# Patient Record
Sex: Male | Born: 1945 | Race: White | Hispanic: No | Smoking: Former smoker
Health system: Southern US, Community
[De-identification: ages and names within clinical notes are randomized; demographics above are authoritative.]

## PROBLEM LIST (undated history)

## (undated) DIAGNOSIS — I10 Essential (primary) hypertension: Secondary | ICD-10-CM

## (undated) DIAGNOSIS — R519 Headache, unspecified: Secondary | ICD-10-CM

## (undated) DIAGNOSIS — T8859XA Other complications of anesthesia, initial encounter: Secondary | ICD-10-CM

## (undated) DIAGNOSIS — D126 Benign neoplasm of colon, unspecified: Secondary | ICD-10-CM

## (undated) DIAGNOSIS — R51 Headache: Secondary | ICD-10-CM

## (undated) DIAGNOSIS — I739 Peripheral vascular disease, unspecified: Secondary | ICD-10-CM

## (undated) DIAGNOSIS — N400 Enlarged prostate without lower urinary tract symptoms: Secondary | ICD-10-CM

## (undated) DIAGNOSIS — IMO0001 Reserved for inherently not codable concepts without codable children: Secondary | ICD-10-CM

## (undated) DIAGNOSIS — J449 Chronic obstructive pulmonary disease, unspecified: Secondary | ICD-10-CM

## (undated) DIAGNOSIS — F419 Anxiety disorder, unspecified: Secondary | ICD-10-CM

## (undated) DIAGNOSIS — T4145XA Adverse effect of unspecified anesthetic, initial encounter: Secondary | ICD-10-CM

## (undated) DIAGNOSIS — I639 Cerebral infarction, unspecified: Secondary | ICD-10-CM

## (undated) DIAGNOSIS — N186 End stage renal disease: Secondary | ICD-10-CM

## (undated) DIAGNOSIS — G2581 Restless legs syndrome: Secondary | ICD-10-CM

## (undated) DIAGNOSIS — E785 Hyperlipidemia, unspecified: Secondary | ICD-10-CM

## (undated) DIAGNOSIS — I6529 Occlusion and stenosis of unspecified carotid artery: Secondary | ICD-10-CM

## (undated) DIAGNOSIS — Z992 Dependence on renal dialysis: Secondary | ICD-10-CM

## (undated) DIAGNOSIS — R569 Unspecified convulsions: Secondary | ICD-10-CM

## (undated) DIAGNOSIS — K219 Gastro-esophageal reflux disease without esophagitis: Secondary | ICD-10-CM

## (undated) DIAGNOSIS — E119 Type 2 diabetes mellitus without complications: Secondary | ICD-10-CM

## (undated) HISTORY — PX: AV FISTULA PLACEMENT: SHX1204

---
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
## 1997-01-29 HISTORY — PX: SHOULDER ARTHROSCOPY W/ ROTATOR CUFF REPAIR: SHX2400

## 1998-01-29 DIAGNOSIS — K2270 Barrett's esophagus without dysplasia: Secondary | ICD-10-CM | POA: Insufficient documentation

## 2006-07-30 ENCOUNTER — Ambulatory Visit: Payer: Self-pay | Admitting: Gastroenterology

## 2006-09-17 ENCOUNTER — Ambulatory Visit: Payer: Self-pay | Admitting: Gastroenterology

## 2006-12-13 ENCOUNTER — Other Ambulatory Visit: Payer: Self-pay

## 2006-12-13 ENCOUNTER — Ambulatory Visit: Payer: Self-pay | Admitting: Gastroenterology

## 2008-05-18 ENCOUNTER — Ambulatory Visit: Payer: Self-pay | Admitting: Gastroenterology

## 2008-09-01 ENCOUNTER — Ambulatory Visit: Payer: Self-pay | Admitting: Nephrology

## 2009-07-05 ENCOUNTER — Ambulatory Visit: Payer: Self-pay | Admitting: Family Medicine

## 2009-08-11 ENCOUNTER — Ambulatory Visit: Payer: Self-pay | Admitting: Family Medicine

## 2009-10-24 ENCOUNTER — Inpatient Hospital Stay (HOSPITAL_COMMUNITY): Admission: RE | Admit: 2009-10-24 | Discharge: 2009-10-26 | Payer: Self-pay | Admitting: Neurosurgery

## 2009-10-24 HISTORY — PX: NECK SURGERY: SHX720

## 2010-04-13 LAB — GLUCOSE, CAPILLARY
Glucose-Capillary: 106 mg/dL — ABNORMAL HIGH (ref 70–99)
Glucose-Capillary: 106 mg/dL — ABNORMAL HIGH (ref 70–99)
Glucose-Capillary: 112 mg/dL — ABNORMAL HIGH (ref 70–99)
Glucose-Capillary: 119 mg/dL — ABNORMAL HIGH (ref 70–99)
Glucose-Capillary: 133 mg/dL — ABNORMAL HIGH (ref 70–99)

## 2010-04-13 LAB — BASIC METABOLIC PANEL
BUN: 41 mg/dL — ABNORMAL HIGH (ref 6–23)
CO2: 22 mEq/L (ref 19–32)
CO2: 23 mEq/L (ref 19–32)
Calcium: 9.4 mg/dL (ref 8.4–10.5)
Chloride: 106 mEq/L (ref 96–112)
Creatinine, Ser: 2.33 mg/dL — ABNORMAL HIGH (ref 0.4–1.5)
GFR calc Af Amer: 31 mL/min — ABNORMAL LOW (ref >60)
Glucose, Bld: 97 mg/dL (ref 70–99)
Potassium: 4.9 mEq/L (ref 3.5–5.1)
Sodium: 140 mEq/L (ref 135–145)

## 2010-04-13 LAB — SURGICAL PCR SCREEN
MRSA, PCR: NEGATIVE
Staphylococcus aureus: NEGATIVE

## 2010-04-13 LAB — CBC
HCT: 26.8 % — ABNORMAL LOW (ref 39.0–52.0)
Hemoglobin: 11.4 g/dL — ABNORMAL LOW (ref 13.0–17.0)
MCH: 31.6 pg (ref 26.0–34.0)
MCH: 31.9 pg (ref 26.0–34.0)
MCHC: 34.3 g/dL (ref 30.0–36.0)
MCV: 92.1 fL (ref 78.0–100.0)
Platelets: 241 10*3/uL (ref 150–400)
RBC: 3.57 MIL/uL — ABNORMAL LOW (ref 4.22–5.81)
RDW: 13.3 % (ref 11.5–15.5)
WBC: 9.8 10*3/uL (ref 4.0–10.5)

## 2010-08-15 ENCOUNTER — Ambulatory Visit: Payer: Self-pay | Admitting: Vascular Surgery

## 2010-08-23 ENCOUNTER — Ambulatory Visit: Payer: Self-pay | Admitting: Vascular Surgery

## 2011-11-08 ENCOUNTER — Ambulatory Visit: Payer: Self-pay | Admitting: Gastroenterology

## 2011-12-03 LAB — COMPREHENSIVE METABOLIC PANEL
Alkaline Phosphatase: 75 U/L (ref 50–136)
BUN: 87 mg/dL — ABNORMAL HIGH (ref 7–18)
Bilirubin,Total: 0.3 mg/dL (ref 0.2–1.0)
Co2: 21 mmol/L (ref 21–32)
Creatinine: 3.07 mg/dL — ABNORMAL HIGH (ref 0.60–1.30)
EGFR (Non-African Amer.): 20 — ABNORMAL LOW
Glucose: 108 mg/dL — ABNORMAL HIGH (ref 65–99)
SGPT (ALT): 27 U/L (ref 12–78)

## 2011-12-03 LAB — CBC
HGB: 9.9 g/dL — ABNORMAL LOW (ref 13.0–18.0)
MCV: 95 fL (ref 80–100)
Platelet: 267 10*3/uL (ref 150–440)
RBC: 2.99 10*6/uL — ABNORMAL LOW (ref 4.40–5.90)
WBC: 7.8 10*3/uL (ref 3.8–10.6)

## 2011-12-03 LAB — URINALYSIS, COMPLETE
Bacteria: NONE SEEN
Glucose,UR: NEGATIVE mg/dL (ref 0–75)
Leukocyte Esterase: NEGATIVE
Nitrite: NEGATIVE
Protein: 30
RBC,UR: NONE SEEN /HPF (ref 0–5)
WBC UR: <1 /HPF (ref 0–5)

## 2011-12-03 LAB — TROPONIN I: Troponin-I: <0.02 ng/mL

## 2011-12-04 ENCOUNTER — Inpatient Hospital Stay: Payer: Self-pay | Admitting: Internal Medicine

## 2011-12-04 DIAGNOSIS — I6523 Occlusion and stenosis of bilateral carotid arteries: Secondary | ICD-10-CM | POA: Insufficient documentation

## 2011-12-04 HISTORY — PX: DOPPLER ECHOCARDIOGRAPHY: SHX263

## 2011-12-04 LAB — LIPID PANEL
HDL Cholesterol: 31 mg/dL — ABNORMAL LOW (ref 40–60)
Triglycerides: 219 mg/dL — ABNORMAL HIGH (ref 0–200)
VLDL Cholesterol, Calc: 44 mg/dL — ABNORMAL HIGH (ref 5–40)

## 2011-12-04 LAB — CBC WITH DIFFERENTIAL/PLATELET
Basophil #: 0 10*3/uL (ref 0.0–0.1)
Basophil %: 0.3 %
Eosinophil #: 0.3 10*3/uL (ref 0.0–0.7)
HCT: 27.8 % — ABNORMAL LOW (ref 40.0–52.0)
HGB: 9.7 g/dL — ABNORMAL LOW (ref 13.0–18.0)
Lymphocyte #: 2.1 10*3/uL (ref 1.0–3.6)
Lymphocyte %: 25.9 %
MCH: 32.8 pg (ref 26.0–34.0)
MCHC: 34.9 g/dL (ref 32.0–36.0)
Monocyte #: 0.9 x10 3/mm (ref 0.2–1.0)
Neutrophil #: 4.8 10*3/uL (ref 1.4–6.5)
RDW: 13.6 % (ref 11.5–14.5)

## 2011-12-04 LAB — BASIC METABOLIC PANEL
BUN: 84 mg/dL — ABNORMAL HIGH (ref 7–18)
Creatinine: 2.8 mg/dL — ABNORMAL HIGH (ref 0.60–1.30)
EGFR (Non-African Amer.): 22 — ABNORMAL LOW
Glucose: 98 mg/dL (ref 65–99)
Osmolality: 309 (ref 275–301)
Potassium: 3.1 mmol/L — ABNORMAL LOW (ref 3.5–5.1)

## 2011-12-04 LAB — TSH: Thyroid Stimulating Horm: 2.25 u[IU]/mL

## 2011-12-05 LAB — BASIC METABOLIC PANEL
Anion Gap: 14 (ref 7–16)
BUN: 79 mg/dL — ABNORMAL HIGH (ref 7–18)
Chloride: 102 mmol/L (ref 98–107)
Creatinine: 2.79 mg/dL — ABNORMAL HIGH (ref 0.60–1.30)

## 2011-12-05 LAB — IRON AND TIBC
Iron Bind.Cap.(Total): 314 ug/dL (ref 250–450)
Iron Saturation: 23 %
Iron: 72 ug/dL (ref 65–175)

## 2011-12-05 LAB — MAGNESIUM: Magnesium: 1.8 mg/dL

## 2011-12-05 LAB — POTASSIUM: Potassium: 3.7 mmol/L

## 2011-12-18 ENCOUNTER — Ambulatory Visit: Payer: Self-pay | Admitting: Vascular Surgery

## 2011-12-20 ENCOUNTER — Other Ambulatory Visit: Payer: Self-pay | Admitting: Vascular Surgery

## 2011-12-20 LAB — PROTIME-INR
INR: 1
Prothrombin Time: 13.8 secs (ref 11.5–14.7)

## 2013-02-24 ENCOUNTER — Ambulatory Visit: Payer: Self-pay | Admitting: Internal Medicine

## 2013-02-27 ENCOUNTER — Ambulatory Visit: Payer: Self-pay | Admitting: Internal Medicine

## 2013-02-27 LAB — RETICULOCYTES
ABSOLUTE RETIC COUNT: 0.066 10*6/uL (ref 0.019–0.186)
Reticulocyte: 2.57 % (ref 0.4–3.1)

## 2013-02-27 LAB — CBC CANCER CENTER
Basophil: 1 %
Eosinophil: 4 %
HCT: 25 % — AB (ref 40.0–52.0)
HGB: 8.3 g/dL — AB (ref 13.0–18.0)
LYMPHS PCT: 14 %
MCH: 32.4 pg (ref 26.0–34.0)
MCHC: 33.3 g/dL (ref 32.0–36.0)
MCV: 97 fL (ref 80–100)
MONOS PCT: 9 %
PLATELETS: 339 x10 3/mm (ref 150–440)
RBC: 2.56 10*6/uL — AB (ref 4.40–5.90)
RDW: 13.4 % (ref 11.5–14.5)
Segmented Neutrophils: 70 %
Variant Lymphocyte: 2 %
WBC: 8.5 x10 3/mm (ref 3.8–10.6)

## 2013-02-27 LAB — IRON AND TIBC
IRON: 54 ug/dL — AB (ref 65–175)
Iron Bind.Cap.(Total): 369 ug/dL (ref 250–450)
Iron Saturation: 15 %
Unbound Iron-Bind.Cap.: 315 ug/dL

## 2013-02-27 LAB — LACTATE DEHYDROGENASE: LDH: 202 U/L (ref 85–241)

## 2013-02-27 LAB — FOLATE: Folic Acid: 42.5 ng/mL (ref 3.1–100.0)

## 2013-02-27 LAB — FERRITIN: Ferritin (ARMC): 33 ng/mL (ref 8–388)

## 2013-03-01 ENCOUNTER — Ambulatory Visit: Payer: Self-pay | Admitting: Internal Medicine

## 2013-03-02 LAB — PROT IMMUNOELECTROPHORES(ARMC)

## 2013-03-02 LAB — URINE IEP, RANDOM

## 2013-03-04 LAB — OCCULT BLOOD X 1 CARD TO LAB, STOOL
Occult Blood, Feces: POSITIVE
Occult Blood, Feces: POSITIVE

## 2013-03-29 ENCOUNTER — Ambulatory Visit: Payer: Self-pay | Admitting: Internal Medicine

## 2013-04-07 ENCOUNTER — Ambulatory Visit: Payer: Self-pay | Admitting: Vascular Surgery

## 2013-04-07 HISTORY — PX: ARCH AORTOGRAM: SHX5726

## 2013-04-07 LAB — CREATININE, SERUM
Creatinine: 3.85 mg/dL — ABNORMAL HIGH (ref 0.60–1.30)
EGFR (Non-African Amer.): 15 — ABNORMAL LOW
GFR CALC AF AMER: 17 — AB

## 2013-04-07 LAB — BUN: BUN: 63 mg/dL — ABNORMAL HIGH (ref 7–18)

## 2013-04-22 HISTORY — PX: OTHER SURGICAL HISTORY: SHX169

## 2013-05-25 ENCOUNTER — Emergency Department: Payer: Self-pay | Admitting: Emergency Medicine

## 2013-05-25 ENCOUNTER — Ambulatory Visit: Payer: Self-pay | Admitting: Internal Medicine

## 2013-05-25 LAB — FERRITIN: Ferritin (ARMC): 77 ng/mL (ref 8–388)

## 2013-05-25 LAB — COMPREHENSIVE METABOLIC PANEL
ANION GAP: 13 (ref 7–16)
Albumin: 3.3 g/dL — ABNORMAL LOW (ref 3.4–5.0)
Alkaline Phosphatase: 71 U/L
BILIRUBIN TOTAL: 0.1 mg/dL — AB (ref 0.2–1.0)
BUN: 80 mg/dL — AB (ref 7–18)
CALCIUM: 9.1 mg/dL (ref 8.5–10.1)
CO2: 18 mmol/L — AB (ref 21–32)
Chloride: 110 mmol/L — ABNORMAL HIGH (ref 98–107)
Creatinine: 3.93 mg/dL — ABNORMAL HIGH (ref 0.60–1.30)
EGFR (African American): 17 — ABNORMAL LOW
EGFR (Non-African Amer.): 15 — ABNORMAL LOW
GLUCOSE: 141 mg/dL — AB (ref 65–99)
Osmolality: 308 (ref 275–301)
POTASSIUM: 3.2 mmol/L — AB (ref 3.5–5.1)
SGOT(AST): 24 U/L (ref 15–37)
SGPT (ALT): 23 U/L (ref 12–78)
SODIUM: 141 mmol/L (ref 136–145)
TOTAL PROTEIN: 7.5 g/dL (ref 6.4–8.2)

## 2013-05-25 LAB — IRON AND TIBC
IRON BIND. CAP.(TOTAL): 304 ug/dL (ref 250–450)
IRON SATURATION: 17 %
Iron: 51 ug/dL — ABNORMAL LOW (ref 65–175)
UNBOUND IRON-BIND. CAP.: 253 ug/dL

## 2013-05-25 LAB — CBC
HCT: 23.9 % — AB (ref 40.0–52.0)
HGB: 8.2 g/dL — AB (ref 13.0–18.0)
MCH: 33.8 pg (ref 26.0–34.0)
MCHC: 34.3 g/dL (ref 32.0–36.0)
MCV: 99 fL (ref 80–100)
Platelet: 309 10*3/uL (ref 150–440)
RBC: 2.43 10*6/uL — ABNORMAL LOW (ref 4.40–5.90)
RDW: 14.2 % (ref 11.5–14.5)
WBC: 10.3 10*3/uL (ref 3.8–10.6)

## 2013-05-25 LAB — CBC CANCER CENTER
Basophil #: 0.1 x10 3/mm (ref 0.0–0.1)
Basophil %: 1 %
Eosinophil #: 0.3 x10 3/mm (ref 0.0–0.7)
Eosinophil %: 2.8 %
HCT: 22.7 % — AB (ref 40.0–52.0)
HGB: 7.6 g/dL — AB (ref 13.0–18.0)
LYMPHS ABS: 1.5 x10 3/mm (ref 1.0–3.6)
LYMPHS PCT: 16.4 %
MCH: 32.5 pg (ref 26.0–34.0)
MCHC: 33.7 g/dL (ref 32.0–36.0)
MCV: 97 fL (ref 80–100)
MONO ABS: 0.7 x10 3/mm (ref 0.2–1.0)
Monocyte %: 7.5 %
NEUTROS ABS: 6.5 x10 3/mm (ref 1.4–6.5)
NEUTROS PCT: 72.3 %
Platelet: 318 x10 3/mm (ref 150–440)
RBC: 2.35 10*6/uL — AB (ref 4.40–5.90)
RDW: 14.3 % (ref 11.5–14.5)
WBC: 9 x10 3/mm (ref 3.8–10.6)

## 2013-05-28 ENCOUNTER — Ambulatory Visit: Payer: Self-pay | Admitting: Vascular Surgery

## 2013-05-28 LAB — URINALYSIS, COMPLETE
Bacteria: NONE SEEN
Bilirubin,UR: NEGATIVE
Blood: NEGATIVE
Glucose,UR: NEGATIVE mg/dL (ref 0–75)
Ketone: NEGATIVE
Leukocyte Esterase: NEGATIVE
Nitrite: NEGATIVE
PH: 5 (ref 4.5–8.0)
Protein: >=500
RBC, UR: NONE SEEN /HPF (ref 0–5)
Specific Gravity: 1.009 (ref 1.003–1.030)
Squamous Epithelial: NONE SEEN
WBC UR: 2 /HPF (ref 0–5)

## 2013-05-28 LAB — BASIC METABOLIC PANEL
Anion Gap: 10 (ref 7–16)
BUN: 77 mg/dL — ABNORMAL HIGH (ref 7–18)
CREATININE: 3.79 mg/dL — AB (ref 0.60–1.30)
Calcium, Total: 9 mg/dL (ref 8.5–10.1)
Chloride: 106 mmol/L (ref 98–107)
Co2: 23 mmol/L (ref 21–32)
EGFR (African American): 18 — ABNORMAL LOW
EGFR (Non-African Amer.): 15 — ABNORMAL LOW
Glucose: 103 mg/dL — ABNORMAL HIGH (ref 65–99)
Osmolality: 301 (ref 275–301)
POTASSIUM: 3.4 mmol/L — AB (ref 3.5–5.1)
Sodium: 139 mmol/L (ref 136–145)

## 2013-05-28 LAB — CBC
HCT: 25.8 % — ABNORMAL LOW (ref 40.0–52.0)
HGB: 8.6 g/dL — AB (ref 13.0–18.0)
MCH: 32.6 pg (ref 26.0–34.0)
MCHC: 33.2 g/dL (ref 32.0–36.0)
MCV: 98 fL (ref 80–100)
PLATELETS: 298 10*3/uL (ref 150–440)
RBC: 2.63 10*6/uL — AB (ref 4.40–5.90)
RDW: 14.4 % (ref 11.5–14.5)
WBC: 8.9 10*3/uL (ref 3.8–10.6)

## 2013-05-29 ENCOUNTER — Ambulatory Visit: Payer: Self-pay | Admitting: Internal Medicine

## 2013-06-05 ENCOUNTER — Inpatient Hospital Stay: Payer: Self-pay | Admitting: Vascular Surgery

## 2013-06-05 HISTORY — PX: CAROTID ENDARTERECTOMY: SUR193

## 2013-06-06 LAB — BASIC METABOLIC PANEL
Anion Gap: 11 (ref 7–16)
BUN: 63 mg/dL — ABNORMAL HIGH (ref 7–18)
CALCIUM: 8.8 mg/dL (ref 8.5–10.1)
CREATININE: 3.73 mg/dL — AB (ref 0.60–1.30)
Chloride: 112 mmol/L — ABNORMAL HIGH (ref 98–107)
Co2: 20 mmol/L — ABNORMAL LOW (ref 21–32)
GFR CALC AF AMER: 18 — AB
GFR CALC NON AF AMER: 16 — AB
Glucose: 106 mg/dL — ABNORMAL HIGH (ref 65–99)
OSMOLALITY: 303 (ref 275–301)
Potassium: 3.7 mmol/L (ref 3.5–5.1)
Sodium: 143 mmol/L (ref 136–145)

## 2013-06-06 LAB — CBC WITH DIFFERENTIAL/PLATELET
BASOS PCT: 0.2 %
Basophil #: 0 10*3/uL (ref 0.0–0.1)
EOS PCT: 0 %
Eosinophil #: 0 10*3/uL (ref 0.0–0.7)
HCT: 24.7 % — AB (ref 40.0–52.0)
HGB: 7.9 g/dL — ABNORMAL LOW (ref 13.0–18.0)
Lymphocyte #: 0.9 10*3/uL — ABNORMAL LOW (ref 1.0–3.6)
Lymphocyte %: 8 %
MCH: 32.2 pg (ref 26.0–34.0)
MCHC: 31.9 g/dL — AB (ref 32.0–36.0)
MCV: 101 fL — ABNORMAL HIGH (ref 80–100)
Monocyte #: 0.8 x10 3/mm (ref 0.2–1.0)
Monocyte %: 7.9 %
Neutrophil #: 9 10*3/uL — ABNORMAL HIGH (ref 1.4–6.5)
Neutrophil %: 83.9 %
Platelet: 252 10*3/uL (ref 150–440)
RBC: 2.45 10*6/uL — ABNORMAL LOW (ref 4.40–5.90)
RDW: 14.7 % — AB (ref 11.5–14.5)
WBC: 10.7 10*3/uL — ABNORMAL HIGH (ref 3.8–10.6)

## 2013-06-06 LAB — APTT: Activated PTT: 28.4 secs (ref 23.6–35.9)

## 2013-06-06 LAB — PROTIME-INR
INR: 1.1
PROTHROMBIN TIME: 14 s (ref 11.5–14.7)

## 2013-06-07 ENCOUNTER — Ambulatory Visit: Payer: Self-pay | Admitting: Internal Medicine

## 2013-06-08 LAB — PATHOLOGY REPORT

## 2013-06-23 LAB — CANCER CENTER HEMOGLOBIN: HGB: 7.8 g/dL — ABNORMAL LOW (ref 13.0–18.0)

## 2013-06-29 ENCOUNTER — Ambulatory Visit: Payer: Self-pay | Admitting: Internal Medicine

## 2013-07-20 LAB — CANCER CENTER HEMOGLOBIN: HGB: 7.8 g/dL — AB (ref 13.0–18.0)

## 2013-07-29 ENCOUNTER — Ambulatory Visit: Payer: Self-pay | Admitting: Internal Medicine

## 2013-08-17 LAB — CANCER CENTER HEMOGLOBIN: HGB: 7.7 g/dL — AB (ref 13.0–18.0)

## 2013-08-29 ENCOUNTER — Ambulatory Visit: Payer: Self-pay | Admitting: Internal Medicine

## 2013-09-14 LAB — CBC CANCER CENTER
BASOS ABS: 0.1 x10 3/mm (ref 0.0–0.1)
Basophil %: 0.9 %
Eosinophil #: 0.2 x10 3/mm (ref 0.0–0.7)
Eosinophil %: 3.2 %
HCT: 21.9 % — ABNORMAL LOW (ref 40.0–52.0)
HGB: 7.1 g/dL — AB (ref 13.0–18.0)
Lymphocyte #: 1.1 x10 3/mm (ref 1.0–3.6)
Lymphocyte %: 15 %
MCH: 31.3 pg (ref 26.0–34.0)
MCHC: 32.6 g/dL (ref 32.0–36.0)
MCV: 96 fL (ref 80–100)
Monocyte #: 0.6 x10 3/mm (ref 0.2–1.0)
Monocyte %: 7.4 %
Neutrophil #: 5.5 x10 3/mm (ref 1.4–6.5)
Neutrophil %: 73.5 %
PLATELETS: 304 x10 3/mm (ref 150–440)
RBC: 2.28 10*6/uL — AB (ref 4.40–5.90)
RDW: 14.4 % (ref 11.5–14.5)
WBC: 7.5 x10 3/mm (ref 3.8–10.6)

## 2013-09-14 LAB — FERRITIN: FERRITIN (ARMC): 36 ng/mL (ref 8–388)

## 2013-09-14 LAB — IRON AND TIBC
IRON BIND. CAP.(TOTAL): 326 ug/dL (ref 250–450)
IRON SATURATION: 15 %
IRON: 49 ug/dL — AB (ref 65–175)
Unbound Iron-Bind.Cap.: 277 ug/dL

## 2013-09-29 ENCOUNTER — Ambulatory Visit: Payer: Self-pay | Admitting: Internal Medicine

## 2013-10-20 LAB — CANCER CENTER HEMOGLOBIN: HGB: 7.8 g/dL — AB (ref 13.0–18.0)

## 2013-10-29 ENCOUNTER — Ambulatory Visit: Payer: Self-pay | Admitting: Internal Medicine

## 2013-11-25 LAB — IRON AND TIBC
Iron Bind.Cap.(Total): 299 ug/dL (ref 250–450)
Iron Saturation: 15 %
Iron: 44 ug/dL — ABNORMAL LOW (ref 65–175)
Unbound Iron-Bind.Cap.: 255 ug/dL

## 2013-11-25 LAB — CBC CANCER CENTER
BASOS ABS: 0.1 x10 3/mm (ref 0.0–0.1)
BASOS PCT: 0.9 %
EOS ABS: 0.2 x10 3/mm (ref 0.0–0.7)
EOS PCT: 2.9 %
HCT: 19.9 % — ABNORMAL LOW (ref 40.0–52.0)
HGB: 6.5 g/dL — ABNORMAL LOW (ref 13.0–18.0)
Lymphocyte #: 0.9 x10 3/mm — ABNORMAL LOW (ref 1.0–3.6)
Lymphocyte %: 14 %
MCH: 31.4 pg (ref 26.0–34.0)
MCHC: 32.4 g/dL (ref 32.0–36.0)
MCV: 97 fL (ref 80–100)
MONO ABS: 0.5 x10 3/mm (ref 0.2–1.0)
MONOS PCT: 8.5 %
NEUTROS PCT: 73.7 %
Neutrophil #: 4.7 x10 3/mm (ref 1.4–6.5)
PLATELETS: 305 x10 3/mm (ref 150–440)
RBC: 2.06 10*6/uL — AB (ref 4.40–5.90)
RDW: 15.1 % — ABNORMAL HIGH (ref 11.5–14.5)
WBC: 6.4 x10 3/mm (ref 3.8–10.6)

## 2013-11-25 LAB — FERRITIN: FERRITIN (ARMC): 94 ng/mL (ref 8–388)

## 2013-11-29 ENCOUNTER — Ambulatory Visit: Payer: Self-pay | Admitting: Internal Medicine

## 2013-12-02 LAB — CANCER CENTER HEMOGLOBIN: HGB: 7.2 g/dL — ABNORMAL LOW (ref 13.0–18.0)

## 2013-12-09 LAB — CANCER CENTER HEMOGLOBIN: HGB: 6.6 g/dL — ABNORMAL LOW (ref 13.0–18.0)

## 2013-12-29 ENCOUNTER — Inpatient Hospital Stay: Payer: Self-pay | Admitting: Internal Medicine

## 2013-12-29 LAB — HEMOGLOBIN: HGB: 6.1 g/dL — ABNORMAL LOW (ref 13.0–18.0)

## 2013-12-29 LAB — HEMATOCRIT: HCT: 19.4 % — AB (ref 40.0–52.0)

## 2013-12-29 LAB — POTASSIUM: POTASSIUM: 3.4 mmol/L — AB (ref 3.5–5.1)

## 2013-12-30 LAB — BASIC METABOLIC PANEL
ANION GAP: 16 (ref 7–16)
BUN: 102 mg/dL — ABNORMAL HIGH (ref 7–18)
CALCIUM: 9.5 mg/dL (ref 8.5–10.1)
CREATININE: 5.73 mg/dL — AB (ref 0.60–1.30)
Chloride: 107 mmol/L (ref 98–107)
Co2: 20 mmol/L — ABNORMAL LOW (ref 21–32)
EGFR (Non-African Amer.): 11 — ABNORMAL LOW
GFR CALC AF AMER: 13 — AB
Glucose: 115 mg/dL — ABNORMAL HIGH (ref 65–99)
Osmolality: 318 (ref 275–301)
POTASSIUM: 3.4 mmol/L — AB (ref 3.5–5.1)
SODIUM: 143 mmol/L (ref 136–145)

## 2013-12-30 LAB — CBC WITH DIFFERENTIAL/PLATELET
Basophil #: 0.1 10*3/uL (ref 0.0–0.1)
Basophil %: 0.6 %
EOS PCT: 2.2 %
Eosinophil #: 0.2 10*3/uL (ref 0.0–0.7)
HCT: 22.1 % — AB (ref 40.0–52.0)
HGB: 7 g/dL — ABNORMAL LOW (ref 13.0–18.0)
Lymphocyte #: 1.3 10*3/uL (ref 1.0–3.6)
Lymphocyte %: 12.2 %
MCH: 29 pg (ref 26.0–34.0)
MCHC: 31.5 g/dL — ABNORMAL LOW (ref 32.0–36.0)
MCV: 92 fL (ref 80–100)
MONO ABS: 0.9 x10 3/mm (ref 0.2–1.0)
Monocyte %: 8.3 %
Neutrophil #: 8.5 10*3/uL — ABNORMAL HIGH (ref 1.4–6.5)
Neutrophil %: 76.7 %
PLATELETS: 332 10*3/uL (ref 150–440)
RBC: 2.4 10*6/uL — ABNORMAL LOW (ref 4.40–5.90)
RDW: 16.2 % — AB (ref 11.5–14.5)
WBC: 11.1 10*3/uL — ABNORMAL HIGH (ref 3.8–10.6)

## 2013-12-30 LAB — HEMOGLOBIN: HGB: 7.6 g/dL — ABNORMAL LOW (ref 13.0–18.0)

## 2013-12-31 HISTORY — PX: UPPER GI ENDOSCOPY: SHX6162

## 2013-12-31 LAB — BASIC METABOLIC PANEL
ANION GAP: 13 (ref 7–16)
BUN: 112 mg/dL — ABNORMAL HIGH (ref 7–18)
CALCIUM: 9.2 mg/dL (ref 8.5–10.1)
Chloride: 105 mmol/L (ref 98–107)
Co2: 21 mmol/L (ref 21–32)
Creatinine: 6 mg/dL — ABNORMAL HIGH (ref 0.60–1.30)
EGFR (African American): 12 — ABNORMAL LOW
EGFR (Non-African Amer.): 10 — ABNORMAL LOW
Glucose: 108 mg/dL — ABNORMAL HIGH (ref 65–99)
Osmolality: 314 (ref 275–301)
POTASSIUM: 2.9 mmol/L — AB (ref 3.5–5.1)
SODIUM: 139 mmol/L (ref 136–145)

## 2013-12-31 LAB — CBC WITH DIFFERENTIAL/PLATELET
BASOS ABS: 0.1 10*3/uL (ref 0.0–0.1)
BASOS PCT: 0.8 %
EOS PCT: 2.6 %
Eosinophil #: 0.3 10*3/uL (ref 0.0–0.7)
HCT: 22.3 % — ABNORMAL LOW (ref 40.0–52.0)
HGB: 7.3 g/dL — AB (ref 13.0–18.0)
LYMPHS PCT: 11.6 %
Lymphocyte #: 1.2 10*3/uL (ref 1.0–3.6)
MCH: 29.6 pg (ref 26.0–34.0)
MCHC: 32.8 g/dL (ref 32.0–36.0)
MCV: 90 fL (ref 80–100)
MONO ABS: 0.9 x10 3/mm (ref 0.2–1.0)
MONOS PCT: 9.2 %
NEUTROS PCT: 75.8 %
Neutrophil #: 7.6 10*3/uL — ABNORMAL HIGH (ref 1.4–6.5)
Platelet: 317 10*3/uL (ref 150–440)
RBC: 2.47 10*6/uL — AB (ref 4.40–5.90)
RDW: 16.6 % — AB (ref 11.5–14.5)
WBC: 10.1 10*3/uL (ref 3.8–10.6)

## 2013-12-31 LAB — IRON AND TIBC
IRON BIND. CAP.(TOTAL): 244 ug/dL — AB (ref 250–450)
IRON SATURATION: 12 %
Iron: 30 ug/dL — ABNORMAL LOW (ref 65–175)
UNBOUND IRON-BIND. CAP.: 214 ug/dL

## 2013-12-31 LAB — PROTIME-INR
INR: 1.1
Prothrombin Time: 14.4 secs (ref 11.5–14.7)

## 2013-12-31 LAB — OCCULT BLOOD X 1 CARD TO LAB, STOOL: OCCULT BLOOD, FECES: POSITIVE

## 2013-12-31 LAB — FERRITIN: Ferritin (ARMC): 92 ng/mL (ref 8–388)

## 2013-12-31 LAB — POTASSIUM: Potassium: 3.5 mmol/L (ref 3.5–5.1)

## 2013-12-31 LAB — PHOSPHORUS: Phosphorus: 6.1 mg/dL — ABNORMAL HIGH (ref 2.5–4.9)

## 2014-01-01 LAB — BASIC METABOLIC PANEL
ANION GAP: 11 (ref 7–16)
BUN: 82 mg/dL — ABNORMAL HIGH (ref 7–18)
CALCIUM: 9.1 mg/dL (ref 8.5–10.1)
CREATININE: 4.95 mg/dL — AB (ref 0.60–1.30)
Chloride: 105 mmol/L (ref 98–107)
Co2: 23 mmol/L (ref 21–32)
EGFR (African American): 15 — ABNORMAL LOW
EGFR (Non-African Amer.): 12 — ABNORMAL LOW
Glucose: 103 mg/dL — ABNORMAL HIGH (ref 65–99)
Osmolality: 303 (ref 275–301)
Potassium: 3.1 mmol/L — ABNORMAL LOW (ref 3.5–5.1)
Sodium: 139 mmol/L (ref 136–145)

## 2014-01-01 LAB — PHOSPHORUS: PHOSPHORUS: 4.8 mg/dL (ref 2.5–4.9)

## 2014-01-02 LAB — CBC WITH DIFFERENTIAL/PLATELET
BASOS ABS: 0.1 10*3/uL (ref 0.0–0.1)
Basophil %: 0.6 %
EOS PCT: 3.2 %
Eosinophil #: 0.3 10*3/uL (ref 0.0–0.7)
HCT: 22 % — AB (ref 40.0–52.0)
Lymphocyte #: 1.3 10*3/uL (ref 1.0–3.6)
Lymphocyte %: 12.6 %
MCH: 29.3 pg (ref 26.0–34.0)
MCHC: 32.5 g/dL (ref 32.0–36.0)
MCV: 90 fL (ref 80–100)
MONOS PCT: 8.4 %
Monocyte #: 0.8 x10 3/mm (ref 0.2–1.0)
NEUTROS ABS: 7.6 10*3/uL — AB (ref 1.4–6.5)
NEUTROS PCT: 75.2 %
PLATELETS: 326 10*3/uL (ref 150–440)
RBC: 2.44 10*6/uL — AB (ref 4.40–5.90)
RDW: 16.2 % — ABNORMAL HIGH (ref 11.5–14.5)
WBC: 10 10*3/uL (ref 3.8–10.6)

## 2014-01-02 LAB — BASIC METABOLIC PANEL
Anion Gap: 15 (ref 7–16)
BUN: 89 mg/dL — ABNORMAL HIGH (ref 7–18)
CALCIUM: 8.9 mg/dL (ref 8.5–10.1)
Chloride: 101 mmol/L (ref 98–107)
Co2: 22 mmol/L (ref 21–32)
Creatinine: 5.37 mg/dL — ABNORMAL HIGH (ref 0.60–1.30)
EGFR (African American): 14 — ABNORMAL LOW
EGFR (Non-African Amer.): 11 — ABNORMAL LOW
GLUCOSE: 107 mg/dL — AB (ref 65–99)
Osmolality: 303 (ref 275–301)
Potassium: 3 mmol/L — ABNORMAL LOW (ref 3.5–5.1)
Sodium: 138 mmol/L (ref 136–145)

## 2014-01-02 LAB — HEMOGLOBIN: HGB: 7.1 g/dL — AB (ref 13.0–18.0)

## 2014-01-02 LAB — PHOSPHORUS: Phosphorus: 6.1 mg/dL — ABNORMAL HIGH (ref 2.5–4.9)

## 2014-01-03 LAB — BASIC METABOLIC PANEL
ANION GAP: 10 (ref 7–16)
BUN: 61 mg/dL — ABNORMAL HIGH (ref 7–18)
CHLORIDE: 102 mmol/L (ref 98–107)
CREATININE: 4.58 mg/dL — AB (ref 0.60–1.30)
Calcium, Total: 8.7 mg/dL (ref 8.5–10.1)
Co2: 26 mmol/L (ref 21–32)
EGFR (African American): 16 — ABNORMAL LOW
EGFR (Non-African Amer.): 14 — ABNORMAL LOW
Glucose: 91 mg/dL (ref 65–99)
Osmolality: 293 (ref 275–301)
POTASSIUM: 3.8 mmol/L (ref 3.5–5.1)
Sodium: 138 mmol/L (ref 136–145)

## 2014-01-03 LAB — PHOSPHORUS: PHOSPHORUS: 4 mg/dL (ref 2.5–4.9)

## 2014-01-04 ENCOUNTER — Ambulatory Visit: Payer: Self-pay | Admitting: Internal Medicine

## 2014-01-19 ENCOUNTER — Emergency Department: Payer: Self-pay | Admitting: Emergency Medicine

## 2014-01-19 LAB — CBC WITH DIFFERENTIAL/PLATELET
Basophil #: 0.1 10*3/uL (ref 0.0–0.1)
Basophil %: 0.9 %
EOS ABS: 0.1 10*3/uL (ref 0.0–0.7)
Eosinophil %: 1.5 %
HCT: 22.9 % — ABNORMAL LOW (ref 40.0–52.0)
HGB: 7.3 g/dL — AB (ref 13.0–18.0)
Lymphocyte #: 0.9 10*3/uL — ABNORMAL LOW (ref 1.0–3.6)
Lymphocyte %: 10.3 %
MCH: 28.8 pg (ref 26.0–34.0)
MCHC: 31.7 g/dL — ABNORMAL LOW (ref 32.0–36.0)
MCV: 91 fL (ref 80–100)
MONO ABS: 0.8 x10 3/mm (ref 0.2–1.0)
MONOS PCT: 8.8 %
NEUTROS ABS: 7.1 10*3/uL — AB (ref 1.4–6.5)
Neutrophil %: 78.5 %
Platelet: 413 10*3/uL (ref 150–440)
RBC: 2.52 10*6/uL — ABNORMAL LOW (ref 4.40–5.90)
RDW: 16.2 % — ABNORMAL HIGH (ref 11.5–14.5)
WBC: 9 10*3/uL (ref 3.8–10.6)

## 2014-01-19 LAB — URINALYSIS, COMPLETE
BILIRUBIN, UR: NEGATIVE
Bacteria: NONE SEEN
Blood: NEGATIVE
Glucose,UR: 50 mg/dL (ref 0–75)
KETONE: NEGATIVE
Leukocyte Esterase: NEGATIVE
Nitrite: NEGATIVE
PH: 6 (ref 4.5–8.0)
RBC,UR: <1 /HPF (ref 0–5)
Specific Gravity: 1.009 (ref 1.003–1.030)
Squamous Epithelial: NONE SEEN
WBC UR: 2 /HPF (ref 0–5)

## 2014-01-19 LAB — COMPREHENSIVE METABOLIC PANEL
ALBUMIN: 2.9 g/dL — AB (ref 3.4–5.0)
ALK PHOS: 82 U/L
Anion Gap: 11 (ref 7–16)
BUN: 41 mg/dL — ABNORMAL HIGH (ref 7–18)
Bilirubin,Total: 0.3 mg/dL (ref 0.2–1.0)
CALCIUM: 9.4 mg/dL (ref 8.5–10.1)
CO2: 26 mmol/L (ref 21–32)
CREATININE: 4.92 mg/dL — AB (ref 0.60–1.30)
Chloride: 100 mmol/L (ref 98–107)
EGFR (Non-African Amer.): 13 — ABNORMAL LOW
GFR CALC AF AMER: 15 — AB
GLUCOSE: 100 mg/dL — AB (ref 65–99)
Osmolality: 284 (ref 275–301)
Potassium: 2.8 mmol/L — ABNORMAL LOW (ref 3.5–5.1)
SGOT(AST): 17 U/L (ref 15–37)
SGPT (ALT): 15 U/L
Sodium: 137 mmol/L (ref 136–145)
Total Protein: 7.4 g/dL (ref 6.4–8.2)

## 2014-02-12 ENCOUNTER — Ambulatory Visit: Payer: Self-pay | Admitting: Internal Medicine

## 2014-03-01 ENCOUNTER — Ambulatory Visit: Payer: Self-pay | Admitting: Internal Medicine

## 2014-04-02 LAB — BASIC METABOLIC PANEL
BUN: 31 mg/dL — AB (ref 4–21)
Creatinine: 3.7 mg/dL — AB (ref 0.6–1.3)
GLUCOSE: 103 mg/dL
POTASSIUM: 3.8 mmol/L (ref 3.4–5.3)
Sodium: 139 mmol/L (ref 137–147)

## 2014-04-02 LAB — CBC AND DIFFERENTIAL
HCT: 22 % — AB (ref 41–53)
HEMOGLOBIN: 6.7 g/dL — AB (ref 13.5–17.5)
Platelets: 425 10*3/uL — AB (ref 150–399)
WBC: 7.2 10^3/mL

## 2014-04-02 LAB — HEPATIC FUNCTION PANEL
ALT: 21 U/L (ref 10–40)
AST: 22 U/L (ref 14–40)

## 2014-04-02 LAB — TSH: TSH: 3.54 u[IU]/mL (ref 0.41–5.90)

## 2014-04-07 ENCOUNTER — Ambulatory Visit
Admit: 2014-04-07 | Disposition: A | Discharge status: Home / Self Care | Payer: Self-pay | Attending: Internal Medicine | Admitting: Internal Medicine

## 2014-04-07 ENCOUNTER — Ambulatory Visit: Payer: Self-pay | Admitting: Family Medicine

## 2014-04-10 ENCOUNTER — Inpatient Hospital Stay: Payer: Self-pay | Admitting: Internal Medicine

## 2014-05-18 NOTE — Op Note (Signed)
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  PROCEDURES PERFORMED:  1. Arch aortogram LAO projection.  2. Selective left carotid artery angiography cervical and cerebral imaging.   SURGEON: Katha Cabal, MD  SEDATION: Versed 2 mg plus fentanyl 50 mcg administered IV. Continuous ECG, pulse oximetry and cardiopulmonary monitoring was performed throughout the entire procedure by the interventional radiology nurse. Total sedation time was 45 minutes.   ACCESS: 5 French sheath, left common femoral artery.   FLUOROSCOPY TIME: 1.7 minutes.   CONTRAST USED: Isovue 35 mL.   INDICATIONS: Mr. Schuneman is a 69 year old gentleman with known renal insufficiency who has been followed in the office for moderate atherosclerotic occlusive disease of the carotid arteries bilaterally. Recently he was admitted to the hospital and found to have had a small left-sided stroke. Duplex ultrasound obtained during his hospitalization suggested far greater stenosis than previously documented and therefore he requires further imaging secondary to this discrepancy and his acute cerebrovascular accident. Unfortunately, he has significant renal insufficiency and therefore CT angiography was not obtained as the contrast load would be so great. He is undergoing conventional angiography to minimize contrast exposure. The risks and benefits were reviewed. All questions answered. Alternative therapies were discussed with the patient and family. He agrees to proceed with angiography.   DESCRIPTION OF PROCEDURE: Patient is taken to special procedures, placed in supine position.  After adequate sedation is achieved, his right groin is prepped and draped in a sterile fashion. 1% lidocaine is infiltrated in the soft tissues. Ultrasound is placed in a sterile sleeve. Ultrasound is utilized secondary to lack of appropriate landmarks and to avoid vascular injury. Under direct ultrasound visualization, the common femoral artery is identified. Femoral bifurcation is noted and then the artery scanned more proximally. 1% lidocaine is infiltrated in the soft tissues including the periarterial space. Under direct ultrasound visualization, the artery is noted to be echolucent, pulsatile indicating patency. Image is recorded and under continuous ultrasound guidance the micropuncture needle is inserted into the anterior wall of the common femoral artery, microwire followed by micro sheath, J-wire followed by 5 French sheath and 5 French pigtail catheter. Pigtail catheter is advanced to the ascending aorta. LAO projection of the arch is the obtained. JB-1 catheter and stiff angled Glidewire are then exchanged for the pigtail catheter and the left common carotid artery is engaged. Wire is advanced and the catheter is then advanced up to the distal common carotid artery approximately 2 to 3 cm below the bulb.   AP, lateral and bilateral oblique views of the cervical left carotid artery are then obtained. Waters view and lateral of the intracranial system is then obtained. After review of the images, the catheter is removed over a wire. Oblique view of the groin is obtained and a StarClose device is deployed without difficulty. There are no immediate complications.   INTERPRETATION: The arch is opacified with a bolus injection of contrast in the LAO projection. It appears to be a type I arch. The origins of the great vessels are noted and they are widely patent. There does appear to be a direct origin of the left vertebral from the aortic arch, however, distal imaging to confirm this is a vertebral is not  adequate  and given his renal insufficiency further investigation of this will not be undertaken at this time. The origin of the left common carotid artery is widely patent as is the common carotid artery itself all the way to the bulb.   The bulb is widely patent of the left carotid artery. The external carotid artery is widely patent. Internal carotid artery demonstrates moderate redundancy and tortuosity. There is calcific plaque at its origin, however, this measures approximately 50% to 55% diameter reduction. It is smooth and does not appear to have significant ulceration. More distally the internal carotid artery is widely patent and it fills the middle cerebral and anterior cerebral. There is no cross filling noted. Venous phase in both Waters and lateral view is normal. Intracranial lesions are not identified.      SUMMARY: Approximately 50% to 55% diameter reduction at the origin of the left internal carotid artery without significant intracranial stenoses or other abnormalities noted. More proximally the arch origins of the great vessels in the proximal common carotid artery is widely patent.    ____________________________ Katha Cabal, MD ggs:cms D: 12/18/2011 14:28:04 ET T: 12/18/2011 14:49:17 ET JOB#: [id number]  cc: Katha Cabal, MD, <Dictator> Kirstie Peri. Caryn Section, MD Murlean Iba, MD  Katha Cabal MD ELECTRONICALLY SIGNED 01/16/2012 16:21

## 2014-05-18 NOTE — Discharge Summary (Signed)
PATIENT NAME:  Troy Gallagher, Troy Gallagher MR#:  [id number] DATE OF BIRTH:  August 28, 1945  DATE OF ADMISSION:  12/04/2011 DATE OF DISCHARGE:  12/05/2011  ADMITTING DIAGNOSIS: Transient ischemic attack.  DISCHARGE DIAGNOSES: 1. Cerebrovascular accident, acute, likely thrombotic with infarct in left parietal lobe with slurring of speech as well as expressive aphasia.  2. Bilateral carotid stenosis of approximately 80%.  3. Hypertension. 4. Hyperlipidemia.  5. Tobacco abuse. Patient chews tobacco. 6. Chronic kidney disease stage III. 7. Gout. 8. Restless leg syndrome.  9. Anxiety.   DISCHARGE CONDITION: Stable.    DISCHARGE MEDICATIONS: Patient is to continue:  1. Flaxseed 1 capsule daily.  2. Hydralazine 100 mg 3 times daily.  3. Metolazone 5 mg p.o. every second day.  4. Allopurinol 100 mg p.o. daily.  5. Iron sulfate 325 mg p.o. once daily.  6. Atorvastatin 40 mg p.o. daily.  7. Tekturna 150 mg p.o. daily.  8. Furosemide 40 mg p.o. twice daily.  9. Meclizine 25 mg p.o. once daily.  10. Colcrys 0.6 mg p.o. daily.  11. Nexium 40 mg p.o. daily.  12. Alprazolam 0.5 mg p.o. twice daily.  13. Multivitamin once daily.  14. Restasis 0.05% ophthalmic solution one drop to affected eye twice daily.  15. Fish oil 1200 mg p.o. 3 times daily.  16. Ropinirole 1 mg p.o. once daily at bedtime.  17. Astepro 205.5 mcg inhalation nasal spray one spray once daily.  18. Fluticasone one spray once daily.  19. Ventolin HFA 2 puffs as needed.  20. Aggrenox 25/200 mg 1 capsule twice daily. This is new medication  21. Patient is to stop aspirin as well as indomethacin.   HOME OXYGEN: None.   DIET: 2 grams salt, low fat, low cholesterol, mechanical soft.   ACTIVITY LIMITATIONS: As tolerated.   REFERRAL: Outpatient speech therapy.   FOLLOWUP: Follow-up appointment with Dr. Caryn Section in two days after discharge.   CONSULTANTS:  1. Dr. Delana Meyer. 2. Care management.   HISTORY OF PRESENT ILLNESS: Patient is a  69 year old Caucasian male with past medical history significant for history of chronic kidney disease, hypertension, hyperlipidemia who presented to the hospital with slurring of speech as well as confusion. Please refer to Dr. Governor Specking admission on 12/03/2011. On arrival to the hospital patient's vital signs revealed blood pressure 139/56, pulse was 80, respiratory rate 18, temperature 98. Physical exam revealed normal neurologic exam. Patient did have some expressive aphasia, was not able to speak out for questions, was unable to remember his mother's name.   LABORATORY, DIAGNOSTIC AND RADIOLOGICAL DATA: Chest, portable single view, 12/03/2011 showed no evidence of pneumonia or congestive heart failure or evidence of acute cardiopulmonary abnormality. Lumbar spine AP and lateral 12/03/2011 due to fall approximately a week ago showed degenerative changes in lumbar spine. No objective evidence of acute compression fracture. CT scan of head without contrast 12/03/2011 showed no acute intracranial abnormality. Ultrasound of carotid arteries bilateral 12/03/2011 revealed moderate degree of stenosis of carotid system centered at the carotid bulbs bilaterally, degree of stenosis is approximately 80%. Vertebral arteries are normal in flow direction bilaterally. MRI of brain without contrast 12/04/2011 revealed acute nonhemorrhagic left parietal lobe infarct. Echocardiogram 12/04/2011 left ventricular systolic function is normal. Ejection fraction equal or more than 55%. Left atrium is mildly dilated. Aortic valve opens well. There is trace tricuspid regurgitation.   Patient's EKG showed normal sinus rhythm. No acute ST-T changes were noted at 80 beats per minute. Patient's lab data revealed glucose 108, BUN and creatinine were  87 and 3.07, potassium level 3.2, otherwise BMP was unremarkable. Patient's magnesium level as well as phosphorus levels were normal at 1.9 and 3.9, respectively. Patient's liver enzymes were  unremarkable except albumin level was low at 3.2. Troponin was less than 0.02. TSH was normal at 2.25. CBC: White blood cell count 7.8, hemoglobin 9.9, platelet count 267. Urinalysis was unremarkable.   HOSPITAL COURSE: Patient was admitted to the hospital for further evaluation because of stroke concerns. He underwent MRI of his brain as well as carotid ultrasound and echocardiogram. Echocardiogram was unremarkable. MRI, however, revealed left parietal lobe infarct which was felt to be thrombotic likely. Patient's aspirin which he was using at home was changed to Aggrenox. Patient is to continue Aggrenox twice daily. He is to follow up with his primary physician, Dr. Caryn Section, for further recommendations. Patient's lipid panel was also checked and LDL was found to be satisfactorily controlled at 76. Patient's triglycerides, however, were very high at 219 and HDL was 31. Hemoglobin A1c was also checked, was found to be 5.2. Patient was advised to continue low fat, low cholesterol diet and make decisions about initiation some other medications for hypertriglyceridemia if patient's primary care physician feels they would be beneficial. Patient was noted to have carotid ultrasound abnormalities as high as 80% bilateral carotid stenosis. Vascular surgery consultation with Dr. Delana Meyer was obtained and Dr. Delana Meyer saw patient in consultation on the day of discharge, 12/05/2011. Unfortunately no report from Dr. Nino Parsley examination was noted and not available yet on the computer. According to nursing staff Dr. Delana Meyer felt that patient is to return back home. He is to follow up with him in the next few weeks after discharge for further evaluation. We did not initiate this patient on Plavix because of concern that Dr. Delana Meyer may decide on procedure in the nearest future.   In regards to hypertension, we did not initiate any further antihypertensive medical therapy. Patient's blood pressure was somewhat elevated while he  was in the hospital, however, according to neurologist we should not try to initiate antihypertensive therapy for stroke patients in the next week or so after acute stroke. On day of discharge patient's blood pressure is 140s to 180s intermittently fluctuating around 161W to 960A systolic. Patient is to continue his outpatient medical regimen. He is to follow up with Dr. Caryn Section and have his blood pressure medications advanced in approximately one week after discharge.   For hyperlipidemia, as mentioned above, patient's lipid panel was well controlled. It is recommended to continue patient on current antihyperlipidemic medications.   For tobacco abuse, we discussed patient quitting, however, he chews only tobacco so we did not feel that it was a significant health risk except of course cancerogenic oral as well as GI mucosal transformation. Patient refused any nicotine replacement therapy.   For chronic kidney disease patient is to follow up with his primary care physician as well as nephrologist in the future.  For gout as well as his chronic other medical problems such as restless leg syndrome and anxiety patient is to continue his outpatient management.   Patient is being discharged in stable condition with above-mentioned medications and follow up.   TIME SPENT: 40 minutes.  ____________________________ Theodoro Grist, MD rv:cms D: 12/05/2011 19:03:59 ET T: 12/06/2011 12:28:07 ET JOB#: [id number]  cc: Theodoro Grist, MD, <Dictator> Kirstie Peri. Caryn Section, MD Theodoro Grist MD ELECTRONICALLY SIGNED 12/14/2011 13:01

## 2014-05-18 NOTE — Consult Note (Signed)
General Aspect CVA with aphasia    Present Illness The patient is a 69 year old male with history of hypertension, gout, chronic kidney disease stage III, brought in by EMS because of slurred speech.  The patient's daughter noticed he started to have slurred speech and that he also had confusion and also expressive aphasia.  No history of weakness or clumsiness in the hands or legs.  Slurred speech had resolved by the time he came to the ER but he still has some expressive aphasia.  The patient is oriented to time, place, and person and denies any other complaints.   He has been followed in the office for his renal insufficiency and his ASO of the carotids.  The most recent ultrasound showed <70% bilaterally.  PAST MEDICAL HISTORY:  1. Hypertension. 2. Chronic kidney disease stage III. The patient follows with Dr. Murlean Iba. He already has a left arm AV fistula in preparation for possible dialysis.  3. Gout on chronic allopurinol and Colcrys.   4. Restless legs. 5. Anxiety. 6. Chronic obstructive pulmonary disease.   Home Medications: Medication Instructions Status  Aggrenox 25 mg-200 mg oral capsule, extended release 1 cap(s) orally 2 times a day x 30 days Active  Flax Seed Oil oral capsule 1 cap(s) orally once a day (in the evening) Active  hydrALAZINE 100 mg oral tablet 1 tab(s) orally 3 times a day Active  metolazone 5 mg oral tablet 1 tab(s) orally every other day Active  allopurinol 100 mg oral tablet 1 tab(s) orally once a day (in the morning) Active  ferrous sulfate 325 mg (65 mg elemental iron) oral tablet 1 tab(s) orally once a day (in the evening) Active  atorvastatin 40 mg oral tablet 1 tab(s) orally once a day (in the evening) Active  Tekturna 150 mg oral tablet 1 tab(s) orally once a day (in the evening) Active  furosemide 40 mg oral tablet 1 tab(s) orally 2 times a day Active  meclizine 25 mg oral tablet 1 tab(s) orally once a day (in the morning) Active  Colcrys 0.6  mg oral tablet 1 tab(s) orally once a day (in the morning) Active  Nexium 40 mg oral delayed release capsule 1 cap(s) orally once a day (in the morning) Active  alprazolam 0.5 mg oral tablet 1 tab(s) orally every 12 hours Active  multivitamin 1 tab(s) orally once a day Active  Restasis 0.05% ophthalmic emulsion 1 drop(s) to each affected eye 2 times a day Active  Fish Oil 1200 mg oral capsule 1 cap(s) orally 3 times a day Active  ropinirole 1 mg oral tablet 1 tab(s) orally once a day (at bedtime) Active  Astepro 205.5 mcg/inh (0.15%) nasal spray 1 spray(s) nasal once a day Active  fluticasone 50 mcg/inh nasal spray 1 spray(s) nasal once a day Active  Ventolin HFA 90 mcg/inh inhalation aerosol 2 puff(s) inhaled , As Needed- for Shortness of Breath , for Wheezing  Active    Sulfa drugs: Rash  Case History:   Family History Non-Contributory    Social History negative tobacco, negative ETOH, negative Illicit drugs   Review of Systems:   Fever/Chills No    Cough No    Sputum No    Abdominal Pain No    Diarrhea No    Constipation No    Nausea/Vomiting No    SOB/DOE No    Chest Pain No    Telemetry Reviewed NSR    Dysuria No    Tolerating PT Yes  Tolerating Diet Yes   Physical Exam:   GEN well developed, well nourished, obese    HEENT pink conjunctivae, PERRL, hearing intact to voice    NECK supple  trachea midline    RESP normal resp effort  no use of accessory muscles    CARD regular rate  positive carotid bruits  No LE edema    VASCULAR ACCESS AV fistula present  Good bruit  Good thrill    ABD denies tenderness  denies Flank Tenderness  soft    EXTR negative cyanosis/clubbing, negative edema    SKIN normal to palpation, No rashes, No ulcers    NEURO cranial nerves intact, aphasic, motor/sensory function intact    PSYCH alert, A+O to time, place, person, good insight   Nursing/Ancillary Notes: **Vital Signs.:   06-Nov-13 08:38   Vital Signs Type  Q 4hr   Temperature Temperature (F) 98.1   Celsius 36.7   Temperature Source Oral   Pulse Pulse 76   Respirations Respirations 18   Systolic BP Systolic BP 914   Diastolic BP (mmHg) Diastolic BP (mmHg) 84   Mean BP 119   Pulse Ox % Pulse Ox % 95   Pulse Ox Activity Level  At rest   Oxygen Delivery Room Air/ 21 %   Thyroid:  05-Nov-13 05:08    Thyroid Stimulating Hormone 2.25 (0.45-4.50 (International Unit)  ----------------------- Pregnant patients have  different reference  ranges for TSH:  - - - - - - - - - -  Pregnant, first trimetser:  0.36 - 2.50 uIU/mL)  Cardiology:  05-Nov-13 08:32    Echo Doppler  Interpretation Summary   Left ventricular systolic function is normal. Ejection Fraction =  >55%. The left atrium is mildly dilated. The aortic valve opens  well. There is trace tricuspid regurgitation.   PatientHeight: 175 cm   PatientWeight:97 kg   BSA: 2.1 m2  Procedure:   A two-dimensional transthoracic echocardiogram with color flow and  Doppler was performed.  Left Ventricle   Left ventricular systolic function is normal.   Ejection Fraction = >55%.  Right Ventricle   The right ventricle is grossly normal size.  Atria   The left atrium is mildly dilated.   Right atrial size is normal.  Mitral Valve   The mitral valve is grossly normal.  Tricuspid Valve   The tricuspid valve is not well visualized, but is grossly normal.   There is trace tricuspid regurgitation.  Aortic Valve   The aortic valve opens well.   No aortic regurgitation is present.  Pericardium/Pleural   No pericardial effusion.  MMode 2D Measurements and Calculations   RVDd: 4.3 cm   IVSd: 1.2 cm   LVIDd: 4.6 cm   LVIDs: 3.3 cm   LVPWd: 1.3 cm   FS: 27 %   EF(Teich): 53 %   Ao root diam: 3.0 cm   LA dimension: 4.2 cm   LVOT diam: 2.2 cm   LVLd ap4: 8.4 cm   EDV(MOD-sp4): 122 ml   LVLs ap4: 6.7 cm   ESV(MOD-sp4): 43 ml   EF(MOD-sp4): 65 %   SV(MOD-sp4): 79 ml  Doppler  Measurements and Calculations   MV E point: 101 cm/sec   MV A point: 91 cm/sec   MV E/A: 1.1    MV dec time: 0.21 sec   Ao V2 max: 141 cm/sec   Ao max PG: 8.0 mmHg   AVA(V,D): 3.0 cm2  LV max PG: 5.0 mmHg   LV V1 max: 111  cm/sec   PA V2 max: 120 cm/sec   PA max PG: 6.0 mmHg   PI end-d vel: 113 cm/sec   TR Max vel: 253 cm/sec   TR Max PG: 26 mmHg   RVSP: 31 mmHg   RAP systole: 5.0 mmHg  Reading Physician: Bartholome Bill  Sonographer: Sherrie Sport Interpreting Physician:  Bartholome Bill,  electronically signed on  12-04-2011 17:14:49 Requesting Physician: Bartholome Bill  Routine Chem:  05-Nov-13 05:08    Potassium, Serum  3.1   Glucose, Serum 98   BUN  84   Creatinine (comp)  2.80   Sodium, Serum 142   Chloride, Serum 107   CO2, Serum 23   Calcium (Total), Serum 9.3   Anion Gap 12   Osmolality (calc) 309   eGFR (African American)  26   eGFR (Non-African American)  22 (eGFR values <8mL/min/1.73 m2 may be an indication of chronic kidney disease (CKD). Calculated eGFR is useful in patients with stable renal function. The eGFR calculation will not be reliable in acutely ill patients when serum creatinine is changing rapidly. It is not useful in  patients on dialysis. The eGFR calculation may not be applicable to patients at the low and high extremes of body sizes, pregnant women, and vegetarians.)   Cholesterol, Serum 151   Triglycerides, Serum  219   HDL (INHOUSE)  31   VLDL Cholesterol Calculated  44   LDL Cholesterol Calculated 76 (Result(s) reported on 04 Dec 2011 at 12:14PM.)   Hemoglobin A1c ([facility]) 5.2 (The American Diabetes Association recommends that a primary goal of therapy should be <7% and that physicians should reevaluate the treatment regimen in patients with HbA1c values consistently >8%.)  Routine Hem:  05-Nov-13 05:08    Hemoglobin (CBC)  9.7   WBC (CBC) 8.1   RBC (CBC)  2.95   Hematocrit (CBC)  27.8   Platelet Count (CBC) 266   MCV 94   MCH 32.8    MCHC 34.9   RDW 13.6   Neutrophil % 59.9   Lymphocyte % 25.9   Monocyte % 10.6   Eosinophil % 3.3   Basophil % 0.3   Neutrophil # 4.8   Lymphocyte # 2.1   Monocyte # 0.9   Eosinophil # 0.3   Basophil # 0.0 (Result(s) reported on 04 Dec 2011 at 05:25AM.)     Impression 1. The patient presents with slurred speech and expressive aphasia. MRI shows acute left infarct.  Duplex US of the carotids show high grade disease. He has developed a symptomatic lesion.  At this time I recommend that he continue Aggrenox and he may be DC.  He should continue speach therapy.  I will see him in the office next week and set up an angiogram of the left carotid shich can be done with a minimum of contrast.  Based on this study likely either stenting or endarterectomy will be done.  He agrees with this plan. 2. Chronic kidney disease stage III, stable renal function. Continue the Lasix along with metolazone.  3. High blood pressure. Continue hydralazine and amlodipine and Tekturna. 4. Restless leg syndrome. Continue Requip.    5. History of chronic obstructive pulmonary disease. Continue Ventolin as needed.  6. Seasonal allergies:  Continue Astepro and fluticasone.  7. Gout, chronic, with no acute flare at this time.  Continue Colcrys and allopurinol.    Plan level 4   Electronic Signatures: Hortencia Pilar (MD)  (Signed 06-Nov-13 19:16)  Authored: General Aspect/Present Illness, Home Medications,  Allergies, History and Physical Exam, Vital Signs, Labs, Impression/Plan   Last Updated: 06-Nov-13 19:16 by Hortencia Pilar (MD)

## 2014-05-18 NOTE — H&P (Signed)
PATIENT NAME:  Troy Gallagher, Troy Gallagher MR#:  [id number] DATE OF BIRTH:  1945/08/07  DATE OF ADMISSION:  12/03/2011  PRIMARY CARE PHYSICIAN:  Dr. Caryn Section ER PHYSICIAN:  Dr. Loletta Specter  CHIEF COMPLAINT:  Slurred speech and confusion.   HISTORY OF PRESENT ILLNESS: The patient is a 69 year old male with history of hypertension, gout, chronic kidney disease stage III, brought in by EMS because of slurred speech.  The patient's daughter noticed he started to have slurred speech around 8:00 a.m. this morning and at that time he also had confusion and also expressive aphasia. The patient complained of a slight headache this morning but no blurred vision. No history of weakness in the hands or legs. The patient had no incontinence, no loss of consciousness, and no tingling or numbness in the hands or legs. Slurred speech had resolved by the time he came to the ER but he still has some expressive aphasia.  The patient is oriented to time, place, and person and he is asking for food at this time and denies any other complaints.   PAST MEDICAL HISTORY:  1. Hypertension. 2. Chronic kidney disease stage III. The patient follows with Dr. Murlean Iba. He already has a left arm AV fistula in preparation for possible dialysis.  3. Gout on chronic allopurinol and Colcrys.   4. Restless legs. 5. Anxiety. 6. Chronic obstructive pulmonary disease.   ALLERGIES: He is allergic to sulfa but he is able to tolerate metolazone and Lasix.   MEDICATIONS:   1. Allopurinol 100 mg p.o. daily.  2. Amlodipine 10 mg daily.  3. Hydralazine 100 mg 3 times daily.  4. Nexium 40 mg daily.  5. Furosemide 40 mg p.o. b.i.d.  6. Meclizine as needed for dizziness, 25 mg.  7. Metolazone 5 mg every other day.  8. Multivitamin daily.  9. Colcrys 0.6 mg p.o. daily.  10. Restasis eyedrops twice daily.  11. Xanax 0.5 every 12 hours p.r.n. 12. Tekturna 150 mg at bedtime.  13. Atorvastatin 40 mg daily.  14. Requip 1 mg at  bedtime. 15. Astepro nasal spray. 16. Fluticasone nasal spray. 17. Ventolin inhaler as needed.  PAST SURGICAL HISTORY:  1. Cervical spine surgeries. 2. Left shoulder surgery. 3. AV fistula.   FAMILY HISTORY: Significant for diabetes in sister and father.    SOCIAL HISTORY: The patient still chews tobacco, lives with his daughter. No alcohol. No drugs.    REVIEW OF SYSTEMS:  CONSTITUTIONAL: No fatigue, no weakness, no fever. EYES: No blurred vision. No double vision. ENT: No tinnitus. No epistaxis. No difficulty swallowing. RESPIRATORY: No cough. No wheezing. CARDIOVASCULAR: No chest pain. No orthopnea. GI: No nausea. No vomiting. GU: No dysuria. ENDOCRINE: No polyuria, no nocturia. HEMATOLOGIC: No anemia, no easy bruising. INTEGUMENT: No skin rash.  MUSCULOSKELETAL: No joint pains. NEUROLOGIC: Had slurred speech. No weakness. No numbness. Has dysarthria this morning but he has no slurred speech now. He has some expressive aphasia. PSYCH: No anxiety or insomnia.   PHYSICAL EXAMINATION:  VITAL SIGNS: Blood pressure 139/56, pulse 80, respirations 18, temperature 98.   GENERAL: 69 year old elderly male sitting on the ER stretcher, answering questions appropriately.   HEENT: Head atraumatic, normocephalic. Pupils are equal, reacting to light. Extraocular movements are intact.  ENT- no tympanic membrane congestion. No turbinate hypertrophy. No oropharyngeal erythema.   NECK: No JVD. No carotid bruit. No lymphadenopathy.   CARDIOVASCULAR: S1 and S2 regular. No murmurs.   LUNGS: Clear to auscultation. No wheeze. No rales.   ABDOMEN: Soft.  Bowel sounds present, obese.   EXTREMITIES: No extremity edema. No cyanosis. No clubbing.   NEUROLOGIC: Alert, awake, oriented. Cranial nerves II through XII are intact. Power is 5/5 in upper and lower extremities. Sensation intact. Deep tendon reflexes 2+ bilaterally. The patient has no sensory deficit. The patient's finger-to-nose test is intact. He has  a little bit of expressive aphasia, unable to speak out for the questions. The patient is unable to remember the month's name but is able to remember the year and where he is.   PSYCH: Mood and affect are within normal limits.   LABORATORY AND RADIOLOGICAL DATA: Lumbar spine x-ray showed degenerate disease, no evidence of acute compression. Chest x-ray shows no evidence of pneumonia or congestive heart failure. CT of the head showed no acute abnormality. He has low attenuation in the periventricular subcortical white matter consistent with mild chronic microvascular disease. No hemorrhage, no mass effect, no infarct. WBC 7.8, hemoglobin 9.9, hematocrit 28.5, platelets 267.   Electrolytes: Sodium 142, potassium 3.2, chloride 106, bicarbonate 21, BUN 87, creatinine 3, glucose 108.   Troponin less than 0.02. The patient's  urinalysis is nitrite negative, leukocyte esterase negative.  BUN and creatinine in July last year were 73 and 3.26.   EKG: Normal sinus rhythm, no ST-T changes, 80 beats per minute.     ASSESSMENT AND PLAN:  1. The patient is a 69 year old male with slurred speech and expressive aphasia.  Symptoms concerning for transient ischemic attack. The patient is admitted to the hospitalist service on observation status. Check neuro checks every two hours four times and then every four hours. Check MRI of the brain, carotid ultrasound, and echocardiogram. Continue aspirin and statin, obtain fasting lipids. Physical therapy evaluation.  Follow echocardiogram, carotid ultrasound, and MRA of the brain.  2. Chronic kidney disease stage III, stable renal function. Continue the Lasix along with metolazone.  3. High blood pressure. Continue hydralazine and amlodipine and Tekturna. 4. Restless leg syndrome. Continue Requip.    5. History of chronic obstructive pulmonary disease. Continue Ventolin as needed.  6. Seasonal allergies:  Continue Astepro and fluticasone.  7. Gout, chronic, with no  acute flare at this time.  Continue Colcrys and allopurinol.  8. CODE STATUS: FULL CODE.   TIME SPENT: About 50 minutes.   ____________________________ Epifanio Lesches, MD sk:bjt D: 12/03/2011 19:44:12 ET T: 12/04/2011 06:32:47 ET JOB#: [id number]  cc: Epifanio Lesches, MD, <Dictator> Kirstie Peri. Caryn Section, MD Epifanio Lesches MD ELECTRONICALLY SIGNED 12/22/2011 15:07

## 2014-05-22 NOTE — H&P (Signed)
PATIENT NAME:  Troy Gallagher, Troy Gallagher MR#:  [id number] DATE OF BIRTH:  05/09/1945  DATE OF ADMISSION:  12/29/2013  PRIMARY CARE PHYSICIAN: Lelon Huh, MD   CHIEF COMPLAINT: Shortness of breath.   HISTORY OF PRESENT ILLNESS: This is a 69 year old male who came into the hospital for elective fistulogram for his left upper extremity AV fistula. He underwent angioplasty of the fistula. Post angioplasty, the patient was feeling short of breath. He had hemoglobin drawn which was noted to be 6.1. The patient has a history of chronic anemia and does require transfusions. He is now noted to have a hemoglobin of 6.1 with symptomatic anemia. Hospitalist services were contacted for admission. The patient admits to shortness of air which is progressively getting worse, mostly with exertion but no chest pain, no nausea, no vomiting, no diarrhea, no constipation, no melena, no hematochezia. He does have dark stools but he is on iron supplements. The patient denies any hematuria or any acute blood loss that he has noted.   REVIEW OF SYSTEMS:  CONSTITUTIONAL: No documented fever. No weight gain, no weight loss.  EYES: No blurred or double vision.  EARS, NOSE AND THROAT: No tinnitus. No postnasal drip. No redness of the oropharynx.  RESPIRATORY: No cough, no wheeze, no hemoptysis. Positive dyspnea.  CARDIOVASCULAR: No chest pain, no orthopnea, no palpitations, no syncope.  GASTROINTESTINAL: No nausea. No vomiting, no diarrhea, no abdominal pain. No melena or hematochezia.  GENITOURINARY: No dysuria or hematuria.  ENDOCRINE: No polyuria or nocturia, heat or cold intolerance. HEMATOLOGIC: Positive anemia, no acute bruising or bleeding.  INTEGUMENTARY: No rashes, no lesions.  MUSCULOSKELETAL: No arthritis. No swelling. No gout.  NEUROLOGIC: No numbness, tingling. No ataxia. No seizure activity.  PSYCHIATRIC: Positive anxiety. No insomnia. No ADD.   PAST MEDICAL HISTORY: Consistent with chronic kidney disease stage 4,  hypertension, COPD, GERD, gout, history of hyperlipidemia, restless leg syndrome.   ALLERGIES: SULFA DRUGS.   SOCIAL HISTORY: He used to be a smoker, but quit years ago, does have a 20-30 pack year smoking history. He still chews tobacco. Occasional alcohol use. No illicit drug abuse. Lives with his wife.   FAMILY HISTORY: Mother and father are both deceased. Both died from complications of MI and heart disease.   CURRENT MEDICATIONS: As follows: Allopurinol 100 mg daily, Xanax 0.5 mg every 12 hours as needed, amlodipine 10 mg daily, aspirin 81 mg daily, Astepro nasal spray daily, atorvastatin 40 mg daily, calcitriol 25 mcg 1 capsule daily, colchicine 0.6 mg every other day, Combivent Respimat up to 6 times daily as needed, iron sulfate 325 mg daily, Flonase 1 spray to each nostril daily, Lasix 40 mg b.i.d., hydralazine 100 mg t.i.d., losartan 100 mg daily, meclizine 25 mg twice daily as needed, metolazone 5 mg daily, multivitamin daily, Nexium 40 mg daily, Restasis 1 drop to both eyes twice daily and Requip 3 mg at bedtime.   PHYSICAL EXAMINATION: Presently is as follows:  VITAL SIGNS: Noted to be: Temperature 98.5, pulse 82, respirations 20, blood pressure 192/68, saturation is 98% on room air.  GENERAL: He is a pleasant-appearing male in no apparent distress.  HEENT: He is atraumatic, normocephalic. Extraocular muscles are intact. Pupils equal and reactive to light. Sclerae anicteric. Positive pale conjunctivae. No oropharyngeal erythema.  NECK: Supple. There is no jugular venous distention. No bruits, no lymphadenopathy, no thyromegaly.  HEART: Regular rate and rhythm. No murmurs, no rubs, no clicks.  LUNGS: Clear to auscultation bilaterally. No rales, no rhonchi, no wheezes.  ABDOMEN: Soft, flat, nontender, nondistended. Has good bowel sounds. No hepatosplenomegaly appreciated.  EXTREMITIES: No evidence of any cyanosis, clubbing, or peripheral edema. Has +2 pedal and radial pulses bilaterally.   NEUROLOGIC: The patient is alert, awake, oriented x 3. No focal motor or sensory deficits appreciated bilaterally.  SKIN: Moist and warm with no rashes appreciated.  LYMPHATIC: There is no cervical or axillary lymphadenopathy.   LABORATORY DATA: Showed a potassium of 3.4. Hemoglobin 6.1, hematocrit 19.4.   ASSESSMENT AND PLAN: A 69 year old male with history of chronic kidney disease stage 4, hypertension, chronic obstructive pulmonary disease, gastroesophageal reflux disease, gout, hyperlipidemia, and restless leg syndrome, who presents to the hospital for elective fistulogram and post procedure noted to be short of breath and have symptomatic anemia.  1.  Symptomatic anemia. The patient's hemoglobin is down to 6.1. Baseline is around 7-8. The patient is complaining of shortness of breath. I will admit him to the hospital, transfuse him 1 unit of packed red blood cells. Follow serial hemoglobins. The patient's source of anemia is likely related to his chronic kidney disease, anemia of chronic disease. Although, I will go ahead and check a Hemoccult. The patient follows at the [facility], and gets routine blood transfusion and iron infusions, follows with Dr. Ma Hillock.  2.  Chronic kidney disease stage 4. The patient likely to be started on hemodialysis soon. I will consult nephrology to see if he is to be started on dialysis while he is in the hospital.  3.  Chronic obstructive pulmonary disease. No acute exacerbation. Continue Combivent.  4.  Gout. Continue allopurinol.  5.  History of hyperlipidemia. Continue atorvastatin.  6.  Restless leg syndrome. Continue Requip. 7.  Gastroesophageal reflux disease. Continue Protonix. 8.  Anxiety. Continue as needed Xanax.   CODE STATUS: The patient is a full code.   TIME SPENT: Fifty minutes.    ____________________________ Belia Heman. Verdell Carmine, MD vjs:TT D: 12/29/2013 18:26:27 ET T: 12/29/2013 18:50:06 ET JOB#: [id number]  cc: Belia Heman. Verdell Carmine, MD,  <Dictator> Henreitta Leber MD ELECTRONICALLY SIGNED 01/02/2014 15:59

## 2014-05-22 NOTE — Consult Note (Signed)
PATIENT NAME:  Troy Gallagher, Troy Gallagher MR#:  [id number] DATE OF BIRTH:  [birth year]  DATE OF CONSULTATION:  12/30/2013  REFERRING PHYSICIAN:   CONSULTING PHYSICIAN:  Theodore Demark, NP  REASON FOR CONSULTATION: GI consultation ordered by Dr. Holley Raring for evaluation of blood loss due to recent blood transfusion symptomatic anemia.   HISTORY OF PRESENT ILLNESS: Appreciate consult for this 69 year old Caucasian man with history of chronic kidney disease stage IV yet to start dialysis, followed by Dr. Candiss Norse, IDA followed by Dr. Ma Hillock, CVA, Barrett's, colon polyps, heme-positive stools, diabetes, gout, CVA in 2013, COPD, admitted with hemoglobin of 6.1 and shortness of breath for evaluation of same. Hemoccult test pending this visit. Last endoscopic procedure was 2010. He has been seen in the GI clinic to repeat his EGD and colonoscopies in 2013. We set him up for a barium swallow due to dysphagia, Barrett's and endoscopy. Unfortunately, he had a CVA soon afterwards and was unable to have the procedures. He did not follow up in the GI clinic until February 2015 due to his anemia and heme-positive stool. EGD and colonoscopy were arranged at that time, however, the patient canceled these procedures and did not want to reschedule at that time, did not give reason, thinks today it may have been due to issues with his fistula. Reports today he is no longer having any dysphagia. States he is still on Nexium 40 mg p.o. daily. Reports having a black, tarry stool 2 days ago after eating chocolate. Denies abdominal pain, uncontrolled dyspepsia, nausea, vomiting, diarrhea, constipation, and all other GI complaints. Did have a unit of packed red blood cells today and says he feels like this has improved his breathing somewhat. States he may start dialysis this visit. States that he is no longer on Aggrenox. He cannot remember when he stopped this. I did contact his preferred pharmacy and spoke with [facility]. She said he last filled  that prescription in April and was given a 30 day supply, so I do believe in May was the last time he had any of this medication. The patient also reports he may start dialysis this visit.   REVIEW OF SYSTEMS: Ten systems reviewed. Significant for fatigue, dyspnea on exertion, shortness of breath as above, intermittent lower extremity edema, anxiety, foot pain sometimes due to gout, anemia, otherwise unremarkable as noted above.   PAST MEDICAL HISTORY: Chronic kidney disease, COPD, GERD, gout, hyperlipidemia, restless leg syndrome, CVA 2013, diabetes, hypertension, reflux as noted, peptic ulcer disease, right upper arm fistula, shoulder surgery, cervical plate.   FAMILY HISTORY: Father with prostate cancer. Brother with colon polyps. No colorectal cancer, liver disease or ulcers.   SOCIAL HISTORY: Lives with his daughter. Endorses 1 beer a day. No illicits or tobacco. No history of blood transfusion until over the last few months.   CURRENT MEDICATIONS: Allopurinol 100 mg p.o. daily, Xanax 0.5 mg q. 12 hours p.r.n., amlodipine 10 mg p.o. daily, aspirin 81 mg p.o. daily, Astepro nasal spray daily, atorvastatin 40 mg p.o. daily, Calcitrol 25 mcg once a day, colchicine 0.6 every other day, Combivent up to 6 times a day as needed, iron 325 mg daily, Flonase 1 spray each nostril daily, Lasix 40 mg b.i.d., hydralazine 100 mg t.i.d., losartan 100 mg p.o. daily, meclizine 25 b.i.d. p.r.n., metaxalone 5 mg daily, multivitamin daily, Nexium 40 mg p.o. daily, Restasis 1 drop to each eye daily, Requip 3 mg at bedtime. The patient denies any nonsteroidal anti-inflammatory drug use.   LABORATORY DATA: Most  recent labs: Glucose 115, BUN 102, creatinine 5.73, sodium 143, potassium 3.4, chloride 107, GFR 11, calcium 9.5, WBC 11.1, hemoglobin 7, hematocrit 22.1, platelet count 332,000. Red cells slightly hypochromic with increased RDW.   PHYSICAL EXAMINATION: VITAL SIGNS: Most recent: Temperature 98.6, pulse 80,  respiratory rate 18, blood pressure 156/62, SaO2 97% on room air.  GENERAL: Pleasant, well-appearing man.  PSYCHIATRIC: Pleasant, chronically ill, but well-appearing man in no acute distress.  HEENT: Normocephalic, atraumatic. Conjunctivae pink. Sclerae are clear.  NECK: Supple. No JVD, lymphadenopathy.  CARDIAC: S1, S2. RRR. No MRG.  LUNGS: Respirations eupneic at rest. Mild dyspnea on exertion that resolves easily. Breath sounds clear bilaterally.  ABDOMEN: Obese abdomen. Bowel sounds x 4. No guarding, rigidity, tenderness, distention. No hepatosplenomegaly. No masses.  EXTREMITIES: MAEW x 4. No cyanosis or clubbing, 1+ lower extremity edema.  NEUROLOGIC: Alert, oriented x 3. Cranial nerves II through XII intact. Speech clear. No facial droop.  SKIN: Warm, dry, pink, somewhat pale. No erythema or rash.  PSYCHIATRIC: Pleasant, calm, cooperative, somewhat limited insight.   IMPRESSION AND PLAN: Anemia exacerbation, suspect episode of melena, history of heme-positive stools, iron deficiency anemia and rescheduling planned endoscopies due to various reasons. Would insure proton pump inhibitor therapy, follow hemoglobin, and transfuse p.r.n. I have discussed him with Dr. Candace Cruise. We do recommend EGD this visit for evaluation. We will tentatively schedule him for tomorrow. The patient is unsure if he wants to do this. States he really wants to go home and is considering leaving AMA. I did discuss this with him and encouraged him to comply with medical therapy with rationale. Discussed this with the charge nurse and I have also discussed this with Dr. Benjie Karvonen.    These services were provided by Stephens November, MSN, [facility], in collaboration with Dr. Verdie Shire, MD, with whom I have discussed this patient in full. Thank you very much for this consult.    ____________________________ Theodore Demark, NP chl:at D: 12/30/2013 16:48:16 ET T: 12/30/2013 17:54:46 ET JOB#: [id number]  cc: Theodore Demark, NP,  <Dictator> [place] SIGNED 01/07/2014 8:58

## 2014-05-22 NOTE — Consult Note (Signed)
Brief Consult Note: Diagnosis: symptomatic anemia.   Patient was seen by consultant.   Consult note dictated.   Comments: Appreciate consult for 69 y/o caucasian man with history of CKD stage IV yet to start dialysis (followed by Dr Candiss Norse) , IDA (followed by Dr Ma Hillock) CVA, Barretts, colon polyps, heme positive stools, DM, gout, COPD, admitted with hgb 6.1/sob, for evaluation of same.  Hemoccult pending. last endoscopic procedure was 2010. He has been seen in the GI clinic to repeat his egd and colonoscopies: in 2013, we set him up for barium swallow due to dysphagia/barretts and set endoscopies up- unfortunately he had CVA soon afterwards and was unable to have procedures. He did not follow up in the GI clinic until 2/15 due to anemia/heme pos stool: EGD/colonoscopy was arranged, however patient cancelled this procedure and did not want to reschedule at  that time, did not give reason. Thinks today it may have been due to issues with his fistula. Reports today that he is no longer having any dysphagia. States he is still on his Nexium 40mg  po daily. reports having a black tarry stool 2d ago after eating chocolate. Denies abdominal pain, uncontrolled dyspepsia, NVD, constipation, and all other GI related complaints. Had a unit of PRBCs today and feels like that has improved his breathing somewhat. States he may start dialysis this vist. States he is no longer on Aggrenox, cannot remember when this was stopped. Thinks he may start dialysis this visit. Impression and plan. Anemia exacerbation. Suspect episode of melena. History of heme postive stools and rescheduling planned endoscopies. Would ensure PPI therapy, follow hgb and transfuse prn. Do recommend EGD this visit for evaluation. Patient unsure he wants to do this. States he really wants to leave and is considering leaving AMA. Discussed this with him, encouraged him to comply with medical therapy with rationale. Discussed this with the charge nurse,  have also discussed this with Dr Benjie Karvonen.  Addend: have d/w Dr Candace Cruise: we will tentschedule for EGD trw pm  as feasible, pending patient agreement. No aggrenox since May acc to pharmacy.  Electronic Signatures: Stephens November H (NP)  (Signed 02-Dec-15 16:49)  Authored: Brief Consult Note   Last Updated: 02-Dec-15 16:49 by Theodore Demark (NP)

## 2014-05-22 NOTE — Consult Note (Signed)
Hgb stable. K upto 3.5 after KCL. 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 Multiple biopsies taken. No active bleeding. Diet ordered.   Electronic Signatures: Verdie Shire (MD)  (Signed on 03-Dec-15 14:22)  Authored  Last Updated: 03-Dec-15 14:22 by Verdie Shire (MD)

## 2014-05-22 NOTE — Op Note (Signed)
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   PROCEDURE PERFORMED:  1.  Right carotid endarterectomy with xenograft CorMatrix patch repair.  2.  Repair of arterial defect with CorMatrix patch.   SURGEON:  Katha Cabal, M.D.   FIRST ASSISTANT:  Ms. Gillie Manners.   ANESTHESIA:  General by endotracheal intubation.   FLUIDS:  Per anesthesia record.   ESTIMATED BLOOD LOSS:  100 mL.   SPECIMEN:  Plaque to Pathology for permanent section.   INDICATIONS:  Troy Gallagher is a 69 year old gentleman who presented to the hospital with TIA symptoms attributable to the right hemisphere. Workup demonstrated a critical stenosis of the right internal carotid artery and he is therefore undergoing right carotid endarterectomy. He is a poor stent candidate. The risks and benefits were reviewed. All questions answered. Alternative therapies were reviewed. The patient agrees to proceed.   DESCRIPTION OF PROCEDURE:  The patient is taken to the Operating Room and placed in the supine position and after adequate general anesthesia is induced and appropriate invasive monitors are placed, he is positioned supine with his neck extended slightly and rotated to the left. The neck and chest wall are prepped and draped in a sterile fashion.   A curvilinear incision is made along the anterior margin of the sternocleidomastoid muscle and dissection is carried down through the soft tissues transecting the platysma. The external jugular vein is ligated with 3-0 silk ties.   The omohyoid is then identified and the dissection is carried deep at this level to identify the common carotid artery. The carotid artery is then dissected in a craniad direction, ligating tributaries  which are encountered with 3-0 silk ties. The facial vein is skeletonized and then ligated and divided between 2-0 silk ties. The dissection is carried up to the level of the digastric and the internal carotid artery was dissected circumferentially above the level of visible plaque formation. The external carotid artery is looped with a Silastic vessel loop as well and 7000 units of heparin is given.   The common followed by the external, followed by the internal carotid artery was clamped. Arteriotomy is made and extended with Potts scissors. Indwelling Sundt shunt is placed without difficulty, and flow is re-established to the brain. The carotid endarterectomy is then performed under direct visualization for the common bulb and internal carotid artery. The external carotid artery is treated with eversion technique. There is marked redundancy in the internal carotid artery and it is subsequently plicated, shortening artery by approximately 10 to 12 mm, using 7 interrupted 6-0 Prolene sutures.   CorMatrix patch is then rehydrated on the back table and applied to repair the arterial defect using running 6-0 Prolene in a 4-quadrant technique.   After copious irrigation, the shunt is removed and the suture line is completed. Flow is then re-established first to the external carotid artery and then the internal carotid artery to prevent distal embolization. The suture line is inspected for hemostasis. The blood pressure allowed to drift up to approximately 130, and after the suture line is verified to be hemostatic, Evicel is placed in the base of the wound and along the suture line.   The platysma is then reapproximated using running 3-0 Vicryl and the skin closed with 4-0 Monocryl subcuticular and  Dermabond is applied. The patient tolerated the procedure well. He is awakened in the Operating Room. He is moving all extremities and obeying simple commands. He is subsequently taken to the recovery area in  excellent condition.   ____________________________ Katha Cabal, MD ggs:jm D: 06/05/2013 14:12:46 ET T: 06/05/2013 16:27:02 ET JOB#: [id number]  cc: Katha Cabal, MD, <Dictator> Kirstie Peri. Caryn Section, MD Katha Cabal MD ELECTRONICALLY SIGNED 06/30/2013 12:26

## 2014-05-22 NOTE — Op Note (Signed)
PATIENT NAME:  Troy Gallagher, Troy Gallagher MR#:  [id number] DATE OF BIRTH:  12-Oct-1945  DATE OF PROCEDURE:  04/07/2013  PREOPERATIVE DIAGNOSIS: Critical stenosis of the right internal carotid artery.   POSTOPERATIVE DIAGNOSIS: Critical stenosis of the right internal carotid artery.   PROCEDURE PERFORMED:  1.  Arch aortogram.  2.  Selective injection of the right common carotid artery, cervical views.   SURGEON: Katha Cabal, M.D.   SEDATION: Versed 2 mg plus fentanyl 50 mcg administered IV. Continuous ECG, pulse oximetry and cardiopulmonary monitoring is performed throughout the entire procedure by the interventional radiology nurse. Total sedation time is 40 minutes.   ACCESS: A 5-French sheath, right common femoral artery.   FLUOROSCOPY TIME: 2.0 minutes.   CONTRAST USED: Isovue 35 mL.   INDICATIONS: Troy Gallagher is a 69 year old gentleman, who has been followed for carotid stenosis in the office and recently was found to have worsening of the right side with velocities consistent with a greater than 80% stenosis of the right internal carotid artery. He has significant renal insufficiency and therefore is not a candidate for CT angiography. He is therefore undergoing diagnostic carotid angiography to evaluate the lesion and plan for optimal therapy. Risks and benefits were reviewed. All questions were answered. The patient has agreed to proceed.   DESCRIPTION OF PROCEDURE: The patient is taken to special procedures and placed in the supine position. After adequate sedation is achieved, he is positioned supine with both groins prepped and draped in a sterile fashion. Appropriate timeout is called.   Ultrasound is placed in a sterile sleeve. Common femoral artery is identified. It is echolucent and pulsatile indicating patency. Image is recorded for the permanent record. Under real-time visualization, the common femoral artery is accessed with a micropuncture needle, microwire and the standard by micro  sheath will not track well and so a stiffened micropuncture sheath is advanced without difficulty. Amplatz Super Stiff wire is then advanced under fluoroscopy through the micro sheath and subsequently a 5-French sheath is placed. Pigtail catheter is then advanced over the wire and positioned with its tip in the ascending aorta. LAO projection of the thoracic aorta is obtained. After review of the images, the pigtail catheter is exchanged for a JB-1 over a stiff angled Glidewire. The JB-1 and Glidewire are used to engage the innominate and the catheter is advanced. AP projection of the innominate is performed and subsequently the wire is negotiated into the common carotid. With the catheter now in the mid common carotid, AP, lateral and oblique views of the cervical carotid are obtained. After review of these images, the catheter is removed, oblique view of the groin is performed, and a StarClose device is deployed successfully without difficulty. There are no immediate complications.   INTERPRETATION: The thoracic aortic arch is opacified with a bolus injection of contrast. It is normal configuration. It is a type II arch. There is moderate calcification at the origin of the innominate vein, but this does not appear flow-limiting. There is heavy calcification at the origin of the common carotid on the left and this does appear to have a 60% to 70% narrowing of the origin of the common carotid. There are heavy calcifications of the left subclavian with a 50% to 60% stenosis at its origin.   The innominate and visualized portions of the subclavian are otherwise widely patent as is the common carotid. The right carotid bulb demonstrates heavy calcifications and best seen in the oblique view. The origin of the right internal  carotid artery demonstrates an 85% to 90% stenosis. There is heavy calcification over several centimeters and subsequently there is significant tortuosity of the midportion of the internal  carotid artery. There does not appear to be any hemodynamically significant atherosclerotic plaque formation within the mid and distal internal carotid artery. External carotid artery is widely patent.   SUMMARY:  1.  Greater than 85% to 90% stenosis of the origin of the right internal carotid artery with heavily calcified lesion and marked tortuosity of the midportion of the internal carotid artery.  2.  A 65% to 70% narrowing of the origin of the left common carotid artery.  3.  A 50% to 60% narrowing of the left subclavian at its origin.    ____________________________ Katha Cabal, MD ggs:aw D: 04/07/2013 09:53:56 ET T: 04/07/2013 10:02:41 ET JOB#: [id number]  cc: Katha Cabal, MD, <Dictator> Kirstie Peri. Caryn Section, MD Katha Cabal MD ELECTRONICALLY SIGNED 04/21/2013 18:46

## 2014-05-22 NOTE — Op Note (Signed)
PATIENT NAME:  Troy Gallagher, Troy Gallagher MR#:  [id number] DATE OF BIRTH:  Nov 29, 1945  DATE OF PROCEDURE:  12/30/2013  PREOPERATIVE DIAGNOSES:  1.  Complication of arteriovenous fistula.  2.  Stage V renal insufficiency.  3.  Anemia of chronic disease.   POSTOPERATIVE DIAGNOSES:  1.  Complication of arteriovenous fistula.  2.  Stage V renal insufficiency.  3.  Anemia of chronic disease.   PROCEDURES PERFORMED:  1.  Contrast injection left brachiocephalic fistula.  2.  Percutaneous transluminal angioplasty to 12 mm venous portion left brachiocephalic fistula. 3.  Percutaneous transluminal angioplasty to 10 mm subclavian vein at the subclavian cephalic confluence.   SURGEON: Hortencia Pilar, MD.   SEDATION:  Versed 2 mg plus fentanyl 100 mcg administered IV. Continuous ECG, pulse oximetry, and cardiopulmonary monitoring is performed throughout the entire procedure by the interventional radiology nurse. Total sedation time is 1 hour.   ACCESS:  6 French sheath antegrade direction left brachiocephalic fistula.   CONTRAST USED: Isovue 30 mL.   FLUOROSCOPY TIME: 3.3 minutes.   INDICATIONS: Troy Gallagher is a 69 year old gentleman who presented for nephrology followup and was found to have a markedly abnormal physical exam, his fistula was strongly pulsatile with a very poor thrill. He is therefore undergoing angiography and intervention for salvage of his access. Risks and benefits are reviewed. All questions answered. The patient agrees to proceed.   DESCRIPTION OF PROCEDURE: The patient is taken to special procedures and placed in the supine position. After adequate sedation is achieved he is positioned supine with his left arm extended palm upward. The left arm is prepped and draped in sterile fashion. Appropriate timeout is called.   1% lidocaine is infiltrated in the soft tissues overlying the fistula near the arterial anastomosis and access is obtained with a microneedle, microwire followed by micro  sheath, J-wire followed by a 6 French sheath. Hand injection of contrast is then used to demonstrate the fistula as well as the central veins. Two separate and distinct lesions are noted, one in the subclavian vein at the subclavian cephalic confluence and one in the midportion of the fistula in the area of venous cannulation.   3000 units of heparin are given and a wire is negotiated into the central venous system. Initially a 6 x 4 Lutonix balloon is advanced across the venous lesion and inflated to 14 atmospheres for 2  minutes. Subsequently an 8 x 4 Dorado is advanced across this lesion and inflated to 16 atmospheres for a minute, the balloon is deflated and then repositioned to the subclavian cephalic confluence and reinflated to 16 atmospheres. Minimal improvement is noted at both locations and therefore a 10 x 4 balloon is advanced across the subclavian cephalic confluence and angioplasty to 14 atmospheres is performed.  It is then repositioned in the venous segment and then second angioplasty is performed. Followup imaging demonstrates there is now resolution of the subclavian cephalic confluence lesion, this looks quite good with less than 15% residual stenosis. There is however greater than 40% residual stenosis at the venous portion and therefore a 12 x 4 balloon is advanced across this lesion, inflated to 14 atmospheres for 2 minutes. Followup imaging demonstrates that this area is now fully expanded with minimal if any residual stenosis.   Pursestring suture is placed around the sheath. The sheath is removed after the wire and balloon have been removed. Light pressure is held. There are no immediate complications.   INTERPRETATION: Initial views of the AV fistula demonstrate that  it is actually quite large, there appears to be a narrowing in the midportion of the fistula in the area that would be cannulated venous as well as a narrowing at the subclavian cephalic confluence. Remaining more  proximal subclavian, innominate, and superior vena cava are widely patent. Arterial inflow is widely patent. Following angioplasty to a maximum dimension of 10 mm the subclavian lesion is well treated. Following angioplasty to 12 mm of the venous portion this is now adequately and in fact well treated.   SUMMARY: Successful salvage of left brachiocephalic fistula. The patient may begin dialysis with the fistula at any point needed.    ____________________________ Katha Cabal, MD ggs:bu D: 12/30/2013 19:43:34 ET T: 12/30/2013 20:32:19 ET JOB#: [id number]  cc: Katha Cabal, MD, <Dictator> Munsoor Lilian Kapur, MD Katha Cabal MD ELECTRONICALLY SIGNED 01/02/2014 16:40

## 2014-05-22 NOTE — Consult Note (Signed)
Pt seen and examined. Please see C. London's notes. Poor historian. Feels better with blood transfusion. Heme positive stool and Fe def anemia. Recent melena. Will plan for EGD tomorrow. Known hx of Barrett's. Pt overdue for repeat EGD anyway. Can consider colonoscopy later as outpt. Thanks.  Electronic Signatures: Verdie Shire (MD)  (Signed on 02-Dec-15 23:16)  Authored  Last Updated: 02-Dec-15 23:16 by Verdie Shire (MD)

## 2014-05-24 LAB — SURGICAL PATHOLOGY

## 2014-05-26 NOTE — Discharge Summary (Signed)
PATIENT NAME:  Troy Gallagher, Troy Gallagher MR#:  [id number] DATE OF BIRTH:  10/31/1945  DATE OF ADMISSION:  12/29/2013 DATE OF DISCHARGE:  01/03/2014  ADMITTING PHYSICIAN: Troy Heman. Verdell Carmine, MD.  DISCHARGING PHYSICIAN: Troy Lighter, MD.   PRIMARY CARE PHYSICIAN: Troy Huh, MD.  [place]:  1. Vascular consultation by Dr. Delana Gallagher.  2. Nephrology consultation by Dr. Tresea Gallagher.  3. GI consultation by Dr. Candace Gallagher.   DISCHARGE DIAGNOSES: 1. Acute on chronic anemia requiring 2 units of packed red blood cells transfusion this admission.  2. Acute gastritis.  3. Anemia of chronic disease.  4. 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  5. Hypertension.  6. Hyperlipidemia.  7. Restless leg syndrome.  8. Gout.  9. Gastroesophageal reflux disease.  10. Chronic obstructive pulmonary disease.   DISCHARGE HOME MEDICATIONS:  1. Ferrous sulfate 325 mg p.o. daily.  2. Lasix 40 mg p.o. b.i.d.  3. Nexium 40 mg p.o. daily.  4. Multivitamin 1 tablet p.o. daily. 5. Restasis 0.05% ophthalmic emulsion 1 drop each eye twice a day.  6. Meclizine 25 mg twice a day as needed for dizziness.  7. Astepro 0.15% nasal spray 2 sprays each nostril once a day.  8. Combivent Respimat 2 puffs 3 times a day.  9. Colchicine 0.6 mg daily p.r.n. for gouty attack. 10. Losartan 100 mg p.o. daily.  11. Atorvastatin 40 mg p.o. at bedtime.  12. Allopurinol 100 mg p.o. daily.  13. Ropinirole 3 mg p.o. at bedtime.  14. Amlodipine 10 mg p.o. daily.  15. Hydralazine 100 mg p.o. 3 times a day.  16. Xanax 0.5 mg every 2 hours p.r.n. for anxiety.  17. Calcitriol 0.25 mcg p.o. daily.   DISCHARGE DIET: Renal diet.   DISCHARGE ACTIVITY: As tolerated.    FOLLOWUP INSTRUCTIONS: 1. Follow up at [facility] as prior schedule.  2. PCP followup in 1 week.  3. Follow up for dialysis with [facility] on [address] on 12/8 2015.    LABORATORIES AND IMAGING STUDIES: Prior to discharge: Sodium 138, potassium 3.8, chloride 102, bicarbonate 26, BUN 61, creatinine 4.58, glucose 91, calcium 8.7. GFR of 14. Hemoglobin is 7.1, WBC 10.0, hematocrit 22 and platelet count 326,000. Hepatitis B antigen negative, parathyroid hormone is pending. Serum iron levels low at 30. Iron binding capacity at 244. Iron saturation was only 12%.   BRIEF HOSPITAL COURSE: Troy Gallagher is a 69 year old, Caucasian male with a past medical history significant for hypertension, CKD stage 4, anemia of chronic disease, transfusion dependent, who follows at the [facility] for blood and iron infusions periodically. He presents from home secondary to shortness of breath. He was noted to have a hemoglobin of 6.1.  1. Acute on chronic anemia, known history of transfusion dependent anemia. Follows at the [facility]. Baseline hemoglobin is usually around 7.5 to 8, dropped down to 6.1 and was symptomatic. He received a total of 2 units of packed red blood cells transfusion and hemoglobin is stable around 7 at this time. He will continue to follow up with the [facility]. He also received Epogen during dialysis and is on iron supplements at home. He denies any GI causes of blood loss. Further work up can be done as an outpatient.  2. 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 The patient was in the process of being started on dialysis and as an outpatient, had elective fistulogram and left upper extremity AV  fistula placed already. He was initiated on dialysis here in the hospital, received 3 sessions with no major side effects. He is established with outpatient dialysis at this time and will start on this Tuesday, 01/05/2014. He is on a Tuesday, Thursday, Saturday dialysis schedule. Nephrology has been following the patient in the hospital. Vascular has followed for patency and issues with his fistula.  3. COPD was stable in the hospital. No acute exacerbation.  His home inhalers are being continued.  4. Acute gastritis. As noted on an EGD, the patient had chronic gastritis. He is on Nexium and biopsies were taken for Barrett's esophagus.   5. Hypertension. His home medications are being continued without any changes.  6. Restless leg syndrome. He is on Ropinirole.  7. His course has been otherwise uneventful in the hospital.   DISCHARGE CONDITION: Stable.   DISCHARGE DISPOSITION: Home with his daughter.   TIME SPENT ON DISCHARGE: 40 minutes.    ____________________________ Troy Lighter, MD rk:TT D: 01/03/2014 12:51:02 ET T: 01/03/2014 19:14:08 ET JOB#: [id number]  cc: Troy Lighter, MD, <Dictator> Kirstie Peri. Caryn Section, MD Tama High, MD  Troy Lighter MD ELECTRONICALLY SIGNED 02/02/2014 13:42

## 2014-05-30 NOTE — Consult Note (Signed)
Brief Consult Note: Diagnosis: anemia.   Patient was seen by consultant.   Comments: Troy Gallagher is a 69 y/o male with anemia of chronic disease, hx colon polyps, Barrett's esophagus & hx hemoccult positive stool.  He is established patient of [facility] & was supposed to folllow up with them for colonoscopy outpatient after last admission where he had EGD that did not show explanation for hemoccult positive stool.  No gross GI bleeding.  If hgb remains stable, outpatient colonoscopy to look for source of hemoccult positive stool.  I have discussed his care with Dr Evangeline Gula [facility].   Thanks for allowing [place] to participate in his care.  Please see full dictated note. #[id number].  Electronic Signatures: Andria Meuse (NP)  (Signed 14-Mar-16 21:18)  Authored: Brief Consult Note   Last Updated: 14-Mar-16 21:18 by Andria Meuse (NP)

## 2014-05-30 NOTE — Discharge Summary (Signed)
PATIENT NAME:  Troy Gallagher, Troy Gallagher MR#:  [id number] DATE OF BIRTH:  03/01/1945  DATE OF ADMISSION:  04/10/2014 DATE OF DISCHARGE:  04/13/2014  ADMITTING DIAGNOSIS: Lethargy, weakness, shortness of breath, missing hemodialysis.   DISCHARGE DIAGNOSES: 1. Acute altered mental status lethargy due to metabolic encephalopathy from hypoxia, acute congestive heart failure exacerbation and not receiving hemodialysis, now improved.  2. Acute on chronic diastolic congestive heart failure, improved with fluid removal with hemodialysis. With repeat chest x-ray showing resolution of the congestive heart failure.  3. Acute on chronic anemia with possible chronic gastrointestinal blood loss. Had an esophagogastroduodenoscopy  recently, which was negative. Needs outpatient follow-up with GI for a possible colonoscopy.  4. Hypertension.  5. Restless leg syndrome.  6. Anxiety.  7. Possible sleep apnea, outpatient sleep study referral.  8. Seasonal allergies.  9. History of gastritis.  10. Gout.  11. Chronic obstructive pulmonary disease without evidence of acute exacerbation.   CONSULTANTS DURING HOSPITALIZATION: Dr. Juleen China, Dr. Candiss Norse; GI, Tamsen Snider.   PERTINENT LABORATORIES AND EVALUATIONS: Admitting glucose 110, BUN 70, creatinine 5.46, sodium 139, potassium 3.9, chloride 103, CO2 of 22, calcium 9.5. LFTs showed an albumin of 2.8. Troponin 0.07, 0.07, 0.06. Urine drug screen: Benzodiazepines  positive. WBC 9.0; hemoglobin on March 10 was 6.9, and then, hemoglobin on March 14 was 6.8, and then on discharge it was 8.4;  INR 1.2. Blood cultures x 2: No growth. Heptoglobin was elevated at 592.   HOSPITAL COURSE: Please refer to H and P done by the admitting physician. The patient is a 69 year old, obese white male with medical history of end-stage renal disease, who was not feeling well recently and did not go to his dialysis, presented with lethargy and shortness of breath. The patient was noted to have acute  diastolic congestive heart failure. He was admitted and further treatment was undertaken with hemodialysis. The patient also was noted to have anemia and he was transfused for that. After receiving multiple hemodialysis, his breathing is improved. In terms of his anemia, the patient has been seen as an outpatient and has had the EGD, which apparently was negative. He did receive 2 units of packed RBCs. Hemoglobin is stable. He will need a close outpatient follow-up on his hemoglobin and transfuse as needed and a completion of his GI workup. At this time, he is doing much better and is stable for discharge.   DISCHARGE MEDICATIONS: Iron sulfate 325 mg 1 tab p.o. daily, Lasix 40 mg 1 tab p.o. b.i.d., Nexium 40 mg 1 tab p.o. daily, multivitamin 1 tab p.o. daily, Restasis  0.05 ophthalmic 1 drop to each affected eye b.i.d., meclizine 25 mg 1 tab p.o. b.i.d. as needed, Astepro 2 sprays once a day as needed for sinus congestion, Combivent 2 puffs t.i.d., colchicine 0.6 every other day, losartan 100 daily, atorvastatin 40 daily, allopurinol 100 daily, Ropinirole  3 mg at bedtime, alprazolam 0.5 mg 1 tab p.o. q. 12 p.r.n., amlodipine 10 daily, hydralazine 100 one tab p.o. t.i.d., loratadine 10 one tab p.o. q. 48,   DISCHARGE INSTRUCTIONS: Follow-up primary MD in 1-2 weeks, Dr. in 2-4 weeks. Follow up with hematology in 1 to 2 weeks as a new patient for anemia further evaluation for possible hemolytic anemia, as well as needing for transfusion will need to be decided.   DIET: Renal.   ACTIVITY: As tolerated.   TIME SPENT: 35 minutes on this patient.     ____________________________ Lafonda Mosses. Posey Pronto, MD shp:mw D: 04/14/2014 13:55:21 ET T:  04/14/2014 14:33:58 ET JOB#: [id number]  cc: Ardyce Heyer H. Posey Pronto, MD, <Dictator> Alric Seton MD ELECTRONICALLY SIGNED 04/25/2014 12:54

## 2014-05-30 NOTE — Consult Note (Signed)
Chief Complaint:  Subjective/Chief Complaint Hgb stable.   Hemoccult positive.  Denies nausea, vomiting, or abdominal pain.   VITAL SIGNS/ANCILLARY NOTES: **Vital Signs.:   15-Mar-16 04:25  Vital Signs Type Routine  Temperature Temperature (F) 98.4  Celsius 36.8  Pulse Pulse 84  Respirations Respirations 20  Systolic BP Systolic BP 034  Diastolic BP (mmHg) Diastolic BP (mmHg) 70  Mean BP 97  Pulse Ox % Pulse Ox % 98  Pulse Ox Activity Level  At rest  Oxygen Delivery 2L   Brief Assessment:  GEN well developed, well nourished, no acute distress, A/Ox3   Cardiac Regular   Respiratory normal resp effort   Gastrointestinal Normal   Gastrointestinal details normal Nontender  Nondistended  Bowel sounds normal  No rebound tenderness  No gaurding  No rigidity   EXTR negative cyanosis/clubbing, negative edema   Additional Physical Exam Skin: warm, dry   Assessment/Plan:  Assessment/Plan:  Assessment Mixed anemia of chronic disease/IDA/heme positive:  Hgb stable.  Outpatient colonoscopy   Plan Appt for [facility] in 1-2 weeks to et up colonoscopy Will sign off, Please call if you have any questions or concerns   Electronic Signatures: Andria Meuse (NP)  (Signed 15-Mar-16 11:59)  Authored: Chief Complaint, VITAL SIGNS/ANCILLARY NOTES, Brief Assessment, Assessment/Plan   Last Updated: 15-Mar-16 11:59 by Andria Meuse (NP)

## 2014-05-30 NOTE — H&P (Signed)
PATIENT NAME:  Troy Gallagher, Troy Gallagher MR#:  [id number] DATE OF BIRTH:  [birth year]  DATE OF ADMISSION:  04/10/2014  CHIEF COMPLAINT: Lethargy, not feeling well, increased shortness of breath.   History is obtained from the patient's daughter, who is present in the Emergency Room and ER physician. The patient currently is very sleepy, lethargic, unable to give any history or review of systems.   HISTORY OF PRESENT ILLNESS: Troy Gallagher is a 69 year old obese Caucasian gentleman with past medical history of end-stage renal disease on hemodialysis Tuesday, Thursday, Saturday, along with history of stroke, chronic anemia, diabetes, diet-controlled, came to the Emergency Room after he was found to be lethargic, not feeling well, weak, and overall not able to function at home well. He recently finished a course of antibiotic for upper respiratory infection congestion, continued to be short of breath, came to the Emergency Room, was found to have pulmonary edema on chest x-ray. He was also hypoxic initially. He is currently very lethargic, unable to keep his eyes open for me for me to talk to him. ER physician spoke with Dr. Juleen China. The patient is going to get dialyzed today. He missed his dialysis on Thursday since he was not feeling well.   PAST MEDICAL HISTORY: 1. End-stage disease on hemodialysis.  2. Diastolic congestive heart failure.  3. Anemia of chronic disease.  4. History of gastritis.  5. Hypertension.  6. Hyperlipidemia.  7. Restless leg syndrome.  8. Gout. 9. GERD.  10. COPD.   ALLERGIES: SULFA.   SOCIAL HISTORY: He used to be a smoker, quit about a few years ago. He chews tobacco. Occasional alcohol use. Lives with his wife and daughter.   FAMILY HISTORY: Mother and father both deceased, died from complications of MI and heart disease. This was obtained from old records.   MEDICATIONS: 1. Ropinirole 3 mg p.o. daily at bedtime.  2. Restasis 0.05% one drop to affected eye daily.  3. Nexium  40 mg daily.  4. Multivitamin p.o. daily.  6.   Meclizine 25 mg b.i.d. pain p.r.n.  7. Losartan 100 mg daily.  8. Hydralazine 100 mg t.i.d.  9. Lasix 40 mg b.i.d.  10. Ferrous sulfate 325 mg p.o. daily.  11. Combivent Respimat 2 puffs 3 times a day.  12. Colchicine 0.6 mg p.o. every other day as needed for gout.  13. Atorvastatin 40 mg p.o. daily at bedtime.  14. Astepro 2 sprays nasally once a day.  15. Amlodipine 10 mg daily.  16. Alprazolam 0.5 mg b.i.d.  17. Allopurinol 100 mg p.o. daily.   REVIEW OF SYSTEMS: Unobtainable. The patient is quite lethargic, unable to stay awake.   PHYSICAL EXAMINATION: GENERAL: Limited secondary to patient being sedated.  VITAL SIGNS: He is afebrile, pulse is 64, blood pressure is 136/57, saturations are 92% on 3 liters nasal cannula oxygen.  HEENT: Atraumatic, normocephalic. Pupils: PERRLA. EOM intact. Oral mucosa is dry.  NECK: Supple. No JVD. No carotid bruit.  RESPIRATORY: There are coarse breath sounds bilaterally. Decreased breath sounds at the bases. No respiratory distress or labored breathing.  CARDIOVASCULAR: Both the heart sounds are normal. No tachycardia. No murmur heard. PMI not lateralized.  CHEST: Nontender. NEUROLOGICAL: Unable to do complete neurological exam since the patient is lethargic. He moves his extremities spontaneously.  SKIN: Warm and dry.   PSYCHIATRIC: The patient is quite lethargic.   LABORATORY DATA AND IMAGING: CT of the head shows no acute intracranial process, similar findings of advanced atrophy and microvascular ischemic disease,  remote left parietal temporal lobe infarct with calcified left MCA. Chest x-ray consistent with mild congestive heart failure, low lung volumes with bibasilar subsegmental atelectasis. pH is 7.35, pCO2 is 40, pO2 of 77. White count is 9.9, hemoglobin and hematocrit is 8 and 26.3, platelet count is 413,000. Glucose is 112, BUN is 70, creatinine is 5.4. Sodium is 139, potassium is 3.9.  PT/INR is within normal limits. EKG: Normal sinus rhythm.   ASSESSMENT: A 69 year old, Troy Gallagher, with history of end-stage renal disease on hemodialysis, hypertension, chronic obstructive pulmonary disease, gastroesophageal reflux disease, hyperlipidemia, comes to the hospital for increasing shortness of breath and overall, not feeling well. He is being admitted with:  1. Pulmonary edema/acute on chronic diastolic congestive heart failure. His most recent echocardiogram on 04/07/2014 shows ejection fraction of 60% to 65%. There is no wall motion abnormality. We will admit the patient to telemetry floor. The patient is going to be dialyzed today per Dr. Juleen China. He missed his dialysis on Thursday, since he was not feeling well. We will monitor Ins and Outs, continue oxygen, and wean as tolerated.  2. Acute hypoxic respiratory failure due to #1.  The patient was PaO2 of 77 on arterial blood gas.  3. Chronic obstructive pulmonary disease, appears no exacerbation; however, we will continue his inhalers.  4. Gout. Continue allopurinol.  5. Anemia of chronic disease. The patient recently got a blood transfusion about this past Tuesday at hemodialysis. His hemoglobin is stable.  6. Hyperlipidemia. Continue atorvastatin.  7. Restless leg syndrome. Continue Requip.  8. Gastroesophageal reflux disease, on Protonix.  9. Anxiety. Continue Xanax.   CODE STATUS: Full code. This was discussed with the patient's daughter.   TIME SPENT: 55 minutes.  ,   ____________________________ Hart [place] Posey Pronto, Gallagher sap:mw D: 04/10/2014 15:14:01 ET T: 04/10/2014 15:38:47 ET JOB#: [id number]  cc: Troy Favorite A. Posey Pronto, Gallagher, <Dictator> Troy Gallagher. Troy Gallagher Troy Basset Gallagher ELECTRONICALLY SIGNED 05/03/2014 12:19

## 2014-05-30 NOTE — Consult Note (Signed)
PATIENT NAME:  Troy Gallagher, Troy Gallagher MR#:  [id number] DATE OF BIRTH:  12-Jul-1945  DATE OF CONSULTATION:  04/12/2014  REFERRING PHYSICIAN:  Sona A. Posey Pronto, MD   CONSULTING PHYSICIAN:  Andria Meuse, NP  REASON FOR CONSULTATION:  Anemia.   PRIMARY GASTROENTEROLOGIST:  Lupita Dawn. Oh, MD   HISTORY OF PRESENT ILLNESS:  [name] is a 69 year old male with a history of end-stage chronic kidney disease on hemodialysis, COPD, GERD, Barrett esophagus, history of colonic polyps, hypertension, hyperlipidemia, and anemia of chronic disease with history of Hemoccult-positive stool, who was admitted with pulmonary edema and acute-on-chronic CHF and COPD. He was admitted with a hemoglobin of 6.9. His hemoglobin is 6.8 grams today. He denies any rectal bleeding, melena, abdominal pain, nausea, or vomiting. He occasionally has constipation. He denies any nonsteroidal anti-inflammatory drugs. He tells me he has had about 5 to 6 blood transfusions in the past couple of months. He was supposed to follow with Dr. Candace Cruise for an outpatient colonoscopy but tells me he has had "too much going on," and therefore he missed his followup appointment. He has had ongoing weakness, fatigue, and shortness of breath.  On 12/31/2013, he had an EGD by Dr. Candace Cruise that showed changes suspicious for Barrett esophagus. He is followed by Dr. Ma Hillock for iron deficiency anemia.   PAST MEDICAL AND SURGICAL HISTORY:  Anemia of chronic disease and iron deficiency anemia followed by Dr. Ma Hillock, COPD, CHF, GERD, Barrett esophagus, gout, hyperlipidemia, restless leg syndrome, colon polyps, CVA in 2013, diabetes mellitus, hypertension, remote peptic ulcer disease, right arm fistula, shoulder surgery, cervical plate, restless leg syndrome, gout.   MEDICATIONS PRIOR TO ADMISSION:  Allopurinol 100 mg daily, alprazolam 0.5 mg q. 12 hours p.r.n., amlodipine 10 mg daily, Astepro 2 sprays once daily, atorvastatin 40 mg daily, colchicine 0.6 mg daily, Combivent 2 puffs 3  times a day, ferrous sulfate 325 mg daily, furosemide 40 mg b.i.d., hydralazine 100 mg t.i.d., losartan 100 mg daily, meclizine 25 mg b.i.d., multivitamin daily, Nexium 40 mg daily, Restasis eyedrops b.i.d., ropinirole 3 mg at bedtime.   ALLERGIES:  SULFA CAUSES RASH, AND HE IS ALLERGIC TO DUST MITES, WHICH CAUSE RASH.   FAMILY HISTORY:  His brother had colonic polyps. No known history of colorectal carcinoma, liver, or chronic GI disease. Father had prostate cancer.   SOCIAL HISTORY: He lives with his daughter. He denies any illicit drug use. He denies any tobacco abuse. He occasionally consumes alcohol.   REVIEW OF SYSTEMS:  See HPI. Respiratory:  He has had increased shortness of breath and nonproductive coughing; otherwise, negative complete review of systems.  PHYSICAL EXAMINATION: VITAL SIGNS:  BMI 33.6, weight 234.4 pounds, height 70 inches, temperature 98.7, pulse 75, respirations 20, blood pressure 133/65, oxygen saturation 92% on 2 liters via nasal cannula. The patient was examined in dialysis.  GENERAL:  He is an alert, oriented, pleasant, cooperative male in no acute distress.  HEENT:  Sclerae are clear. Conjunctivae are pink. Oropharynx is pink and moist without any lesions.  NECK:  Supple without any mass or thyromegaly.  CHEST:  Heart is regular rate and rhythm. Normal S1 and S2 without murmurs, clicks, rubs, or gallops.  LUNGS:  Clear to auscultation bilaterally.  ABDOMEN:  Protuberant. Positive bowel sounds x 4. No bruits auscultated. Abdomen is soft, nontender, and nondistended without palpable mass or hepatosplenomegaly. No rebound tenderness or guarding.  EXTREMITIES:  Without edema. He does have clubbing.  NEUROLOGIC:  Grossly intact.  PSYCHIATRIC:  Alert, cooperative.  Normal mood and affect.   LABORATORY STUDIES:  Glucose was 129, BUN 58, creatinine 5.72, potassium 3.4, chloride 97, calcium 8.6, and phosphorus 5.8; otherwise, normal basic metabolic panel. LDH was 170.  Ferritin was 228, TIBC 243, UIBC 203.6, iron saturation 16%, and iron was 39. LFTs were normal except for albumin of 2.5 and an ALT of 16. He had 3 mildly elevated troponins, 0.07 to 0.06. TSH was normal. Urine drug screen was positive for benzodiazepines. White blood cell count and platelet count were normal. INR was 1.2. Hepatitis B surface antibody and antigen were negative. Salicylates and acetaminophen were negative.   IMPRESSION:  [name] is a 69 year old male with anemia of chronic disease with a mixed picture of iron deficiency anemia, previously evaluated by [facility] and Dr. Ma Hillock.  He also has a history of colonic polyps, Barrett esophagus, and Hemoccult-positive stool. He is an established patient of [facility] and was supposed to follow up with them for colonoscopy as outpatient after last admission, where he had an esophagogastroduodenoscopy that did not show explanation for Hemoccult-positive stool. He has no gross gastrointestinal bleeding at this time. If his hemoglobin remains stable, would recommend outpatient colonoscopy to look for source of Hemoccult-positive stool and iron deficiency anemia. I have discussed his care with Dr. Lucilla Lame.   PLAN: 1.  Monitor for gross bleeding. If no evidence of gross bleeding, outpatient colonoscopy can be performed through his gastroenterologist, Dr. Candace Cruise.  2.  Monitor hemoglobin and hematocrit.   Thank you for allowing [place] to participate in his care.    ____________________________ Andria Meuse, NP klj:nb D: 04/12/2014 21:18:16 ET T: 04/12/2014 21:30:38 ET JOB#: [id number]  cc: Andria Meuse, NP, <Dictator> Lupita Dawn. Candace Cruise, MD [place] ELECTRONICALLY SIGNED 04/16/2014 12:50

## 2014-06-14 ENCOUNTER — Ambulatory Visit: Payer: Medicare Other | Admitting: Certified Registered Nurse Anesthetist

## 2014-06-14 ENCOUNTER — Ambulatory Visit
Admission: RE | Admit: 2014-06-14 | Discharge: 2014-06-14 | Disposition: A | Discharge status: Home / Self Care | Payer: Medicare Other | Source: Ambulatory Visit | Attending: Gastroenterology | Admitting: Gastroenterology

## 2014-06-14 ENCOUNTER — Encounter
Admission: RE | Disposition: A | Discharge status: Home / Self Care | Payer: Self-pay | Source: Ambulatory Visit | Attending: Gastroenterology

## 2014-06-14 ENCOUNTER — Encounter: Payer: Self-pay | Admitting: *Deleted

## 2014-06-14 DIAGNOSIS — K2270 Barrett's esophagus without dysplasia: Secondary | ICD-10-CM | POA: Insufficient documentation

## 2014-06-14 DIAGNOSIS — D124 Benign neoplasm of descending colon: Secondary | ICD-10-CM | POA: Diagnosis not present

## 2014-06-14 DIAGNOSIS — D122 Benign neoplasm of ascending colon: Secondary | ICD-10-CM | POA: Diagnosis not present

## 2014-06-14 DIAGNOSIS — Z79899 Other long term (current) drug therapy: Secondary | ICD-10-CM | POA: Diagnosis not present

## 2014-06-14 DIAGNOSIS — D125 Benign neoplasm of sigmoid colon: Secondary | ICD-10-CM | POA: Diagnosis not present

## 2014-06-14 DIAGNOSIS — Z86010 Personal history of colonic polyps: Secondary | ICD-10-CM | POA: Insufficient documentation

## 2014-06-14 DIAGNOSIS — R195 Other fecal abnormalities: Secondary | ICD-10-CM | POA: Insufficient documentation

## 2014-06-14 DIAGNOSIS — Z7982 Long term (current) use of aspirin: Secondary | ICD-10-CM | POA: Diagnosis not present

## 2014-06-14 HISTORY — PX: COLONOSCOPY: SHX5424

## 2014-06-14 HISTORY — DX: Gastro-esophageal reflux disease without esophagitis: K21.9

## 2014-06-14 HISTORY — DX: Reserved for inherently not codable concepts without codable children: IMO0001

## 2014-06-14 HISTORY — DX: Adverse effect of unspecified anesthetic, initial encounter: T41.45XA

## 2014-06-14 HISTORY — DX: Cerebral infarction, unspecified: I63.9

## 2014-06-14 HISTORY — DX: Headache, unspecified: R51.9

## 2014-06-14 HISTORY — DX: Headache: R51

## 2014-06-14 HISTORY — DX: Other complications of anesthesia, initial encounter: T88.59XA

## 2014-06-14 HISTORY — DX: Essential (primary) hypertension: I10

## 2014-06-14 SURGERY — COLONOSCOPY
Anesthesia: General

## 2014-06-14 MED ORDER — FENTANYL CITRATE (PF) 100 MCG/2ML IJ SOLN
INTRAMUSCULAR | Status: DC | PRN
  Administered 2014-06-14: 50 ug via INTRAVENOUS

## 2014-06-14 MED ORDER — PROPOFOL INFUSION 10 MG/ML OPTIME
INTRAVENOUS | Status: DC | PRN
  Administered 2014-06-14: 140 ug/kg/min via INTRAVENOUS

## 2014-06-14 MED ORDER — DIPHENHYDRAMINE HCL 50 MG/ML IJ SOLN
INTRAMUSCULAR | Status: DC | PRN
  Administered 2014-06-14: 12.5 mg via INTRAVENOUS

## 2014-06-14 MED ORDER — PROPOFOL 10 MG/ML IV BOLUS
INTRAVENOUS | Status: DC | PRN
Start: 2014-06-14 — End: 2014-06-14
  Administered 2014-06-14: 40 mg via INTRAVENOUS
  Administered 2014-06-14: 15 mg via INTRAVENOUS
  Administered 2014-06-14: 25 mg via INTRAVENOUS

## 2014-06-14 MED ORDER — SODIUM CHLORIDE 0.9 % IV SOLN
INTRAVENOUS | Status: DC
  Administered 2014-06-14: 10:00:00 via INTRAVENOUS

## 2014-06-14 MED ORDER — MIDAZOLAM HCL 2 MG/2ML IJ SOLN
INTRAMUSCULAR | Status: DC | PRN
  Administered 2014-06-14: 1 mg via INTRAVENOUS

## 2014-06-14 NOTE — Anesthesia Postprocedure Evaluation (Signed)
  Anesthesia Post-op Note  Patient: Troy Gallagher  Procedure(s) Performed: Procedure(s): COLONOSCOPY (N/A)  Anesthesia type:General  Patient location: PACU  Post pain: Pain level controlled  Post assessment: Post-op Vital signs reviewed, Patient's Cardiovascular Status Stable, Respiratory Function Stable, Patent Airway and No signs of Nausea or vomiting  Post vital signs: Reviewed and stable  Last Vitals:  Filed Vitals:   06/14/14 0918  BP: 160/59  Pulse: 70  Temp: 37.1 C  Resp: 18    Level of consciousness: awake, alert  and patient cooperative  Complications: No apparent anesthesia complications

## 2014-06-14 NOTE — Transfer of Care (Signed)
Immediate Anesthesia Transfer of Care Note  Patient: Troy Gallagher  Procedure(s) Performed: Procedure(s): COLONOSCOPY (N/A)  Patient Location: PACU and Endoscopy Unit  Anesthesia Type:General  Level of Consciousness: awake, alert  and oriented  Airway & Oxygen Therapy: Patient Spontanous Breathing and Patient connected to nasal cannula oxygen  Post-op Assessment: Report given to RN  Post vital signs: stable  Last Vitals:  Filed Vitals:   06/14/14 0918  BP: 160/59  Pulse: 70  Temp: 37.1 C  Resp: 18    Complications: No apparent anesthesia complications

## 2014-06-14 NOTE — Anesthesia Preprocedure Evaluation (Addendum)
Anesthesia Evaluation  Patient identified by MRN, date of birth, ID band Patient awake    History of Anesthesia Complications (+) history of anesthetic complications (Hard time breathing post-op)  Airway Mallampati: III  TM Distance: >3 FB Neck ROM: Full    Dental  (+) Upper Dentures   Pulmonary former smoker,          Cardiovascular hypertension, Pt. on medications     Neuro/Psych CVA (Speech problems/ r side weakness), Residual Symptoms    GI/Hepatic GERD-  Medicated and Controlled,  Endo/Other    Renal/GU      Musculoskeletal   Abdominal   Peds  Hematology   Anesthesia Other Findings   Reproductive/Obstetrics                             Anesthesia Physical Anesthesia Plan  ASA: III  Anesthesia Plan: General   Post-op Pain Management:    Induction:   Airway Management Planned: Nasal Cannula  Additional Equipment:   Intra-op Plan:   Post-operative Plan:   Informed Consent: I have reviewed the patients History and Physical, chart, labs and discussed the procedure including the risks, benefits and alternatives for the proposed anesthesia with the patient or authorized representative who has indicated his/her understanding and acceptance.     Plan Discussed with:   Anesthesia Plan Comments:         Anesthesia Quick Evaluation

## 2014-06-14 NOTE — H&P (Signed)
  Date of Initial H&P:05/26/2014  History reviewed, patient examined, no change in status, stable for surgery.

## 2014-06-14 NOTE — Op Note (Signed)
[facility] Patient Name: Troy Gallagher Procedure Date: 06/14/2014 9:54 AM MRN: [id number] Account #: [id number] Date of Birth: 12-17-1945 Admit Type: Outpatient Age: 69 Room: [facility] ENDO ROOM 4 Gender: Male Note Status: Finalized Procedure:         Colonoscopy Indications:       Personal history of colonic polyps Providers:         Lupita Dawn. Candace Cruise, MD Referring MD:      Kirstie Peri. Caryn Section, MD (Referring MD) Medicines:         Monitored Anesthesia Care Complications:     No immediate complications. Procedure:         Pre-Anesthesia Assessment:                    - Prior to the procedure, a History and Physical was                     performed, and patient medications, allergies and                     sensitivities were reviewed. The patient's tolerance of                     previous anesthesia was reviewed.                    - The risks and benefits of the procedure and the sedation                     options and risks were discussed with the patient. All                     questions were answered and informed consent was obtained.                    - After reviewing the risks and benefits, the patient was                     deemed in satisfactory condition to undergo the procedure.                    After obtaining informed consent, the colonoscope was                     passed under direct vision. Throughout the procedure, the                     patient's blood pressure, pulse, and oxygen saturations                     were monitored continuously. The Colonoscope was                     introduced through the anus and advanced to the the cecum,                     identified by appendiceal orifice and ileocecal valve. The                     colonoscopy was performed with difficulty due to                     significant looping. Successful completion of the  procedure was aided by changing the patient to a supine           position. The patient tolerated the procedure well. The                     quality of the bowel preparation was fair. Findings:      Three sessile polyps were found in the ascending colon. The polyps were       medium in size. These polyps were removed with a hot snare. Resection       and retrieval were complete.      Two sessile polyps were found in the descending colon. The polyps were       small in size. These polyps were removed with a hot snare. Resection and       retrieval were complete.      Two sessile polyps were found in the sigmoid colon. The polyps were       small in size. These polyps were removed with a hot snare. Resection and       retrieval were complete.      The exam was otherwise without abnormality. Impression:        - Three medium polyps in the ascending colon. Resected and                     retrieved.                    - Two small polyps in the descending colon. Resected and                     retrieved.                    - Two small polyps in the sigmoid colon. Resected and                     retrieved.                    - The examination was otherwise normal. Recommendation:    - Discharge patient to home.                    - Await pathology results.                    - Repeat colonoscopy in 1 year for surveillance based on                     pathology results.                    - The findings and recommendations were discussed with the                     patient. Procedure Code(s): --- Professional ---                    [phone], Colonoscopy, flexible; with removal of tumor(s),                     polyp(s), or other lesion(s) by snare technique Diagnosis Code(s): --- Professional ---                    D12.2, Benign neoplasm of ascending colon  D12.4, Benign neoplasm of descending colon                    D12.5, Benign neoplasm of sigmoid colon                    Z86.010, Personal history of colonic polyps CPT  copyright 2014 American Medical Association. All rights reserved. The codes documented in this report are preliminary and upon coder review may  be revised to meet current compliance requirements. Hulen Luster, MD 06/14/2014 10:40:25 AM This report has been signed electronically. Number of Addenda: 0 Note Initiated On: 06/14/2014 9:54 AM Scope Withdrawal Time: 0 hours 17 minutes 47 seconds  Total Procedure Duration: 0 hours 27 minutes 18 seconds       [facility]

## 2014-06-15 ENCOUNTER — Encounter: Payer: Self-pay | Admitting: Gastroenterology

## 2014-06-15 LAB — SURGICAL PATHOLOGY

## 2014-08-30 ENCOUNTER — Other Ambulatory Visit: Payer: Self-pay | Admitting: Family Medicine

## 2014-08-30 NOTE — Telephone Encounter (Signed)
Refill request for Alprazolam 0.5 mg Last filled by MD on- 06/04/2014 #180 x1  Last Appt: 04/23/2014 Next Appt: none Please advise refill?

## 2014-08-30 NOTE — Telephone Encounter (Signed)
Pt contacted office for refill request on the following medications:  ALPRAZolam (XANAX) 0.5 MG tablet.  [facility].  IX#[phone]/[place]

## 2014-08-31 MED ORDER — ALPRAZOLAM 0.5 MG PO TABS: 0.5000 mg | ORAL_TABLET | Freq: Two times a day (BID) | ORAL | Status: DC

## 2014-08-31 NOTE — Telephone Encounter (Signed)
Rx phoned into pharmacy.

## 2014-09-06 ENCOUNTER — Other Ambulatory Visit: Payer: Self-pay | Admitting: Family Medicine

## 2014-09-06 MED ORDER — ESOMEPRAZOLE MAGNESIUM 40 MG PO CPDR: 40.0000 mg | DELAYED_RELEASE_CAPSULE | Freq: Two times a day (BID) | ORAL | Status: AC

## 2014-09-06 NOTE — Telephone Encounter (Signed)
Rx has been sent to [facility]

## 2014-09-06 NOTE — Telephone Encounter (Signed)
Pt's daughter stated that the kidney center wanted her to call and asked if Dr. Caryn Section would write an RX for Nexium because pt has been having stomach problems and would like it sent to [facility]. Thanks TNP

## 2014-09-06 NOTE — Telephone Encounter (Signed)
Pt called stating he wanted to cancel his refill on Nexium Pt stated that he didn't think his insurance would pay for it twice CB# [phone] CC

## 2014-09-06 NOTE — Telephone Encounter (Signed)
No answer at # listed.

## 2014-09-07 NOTE — Telephone Encounter (Signed)
Patient has already picked up rx.  

## 2014-09-07 NOTE — Telephone Encounter (Signed)
See other phone message. Patient canceled rx request due to cost.

## 2014-09-28 ENCOUNTER — Ambulatory Visit (INDEPENDENT_AMBULATORY_CARE_PROVIDER_SITE_OTHER): Payer: Medicare Other | Admitting: Family Medicine

## 2014-09-28 ENCOUNTER — Encounter: Payer: Self-pay | Admitting: Family Medicine

## 2014-09-28 ENCOUNTER — Ambulatory Visit
Admission: RE | Admit: 2014-09-28 | Discharge: 2014-09-28 | Disposition: A | Discharge status: Home / Self Care | Payer: Medicare Other | Source: Ambulatory Visit | Attending: Family Medicine | Admitting: Family Medicine

## 2014-09-28 VITALS — BP 140/72 | HR 70 | Temp 100.1°F | Resp 16

## 2014-09-28 DIAGNOSIS — R05 Cough: Secondary | ICD-10-CM | POA: Insufficient documentation

## 2014-09-28 DIAGNOSIS — I6523 Occlusion and stenosis of bilateral carotid arteries: Secondary | ICD-10-CM | POA: Diagnosis not present

## 2014-09-28 DIAGNOSIS — J321 Chronic frontal sinusitis: Secondary | ICD-10-CM

## 2014-09-28 DIAGNOSIS — F329 Major depressive disorder, single episode, unspecified: Secondary | ICD-10-CM

## 2014-09-28 DIAGNOSIS — Z8673 Personal history of transient ischemic attack (TIA), and cerebral infarction without residual deficits: Secondary | ICD-10-CM | POA: Insufficient documentation

## 2014-09-28 DIAGNOSIS — E782 Mixed hyperlipidemia: Secondary | ICD-10-CM | POA: Insufficient documentation

## 2014-09-28 DIAGNOSIS — R059 Cough, unspecified: Secondary | ICD-10-CM

## 2014-09-28 DIAGNOSIS — F32A Depression, unspecified: Secondary | ICD-10-CM

## 2014-09-28 DIAGNOSIS — R2991 Unspecified symptoms and signs involving the musculoskeletal system: Secondary | ICD-10-CM | POA: Insufficient documentation

## 2014-09-28 DIAGNOSIS — D649 Anemia, unspecified: Secondary | ICD-10-CM | POA: Insufficient documentation

## 2014-09-28 MED ORDER — AMOXICILLIN-POT CLAVULANATE 875-125 MG PO TABS
1.0000 | ORAL_TABLET | Freq: Two times a day (BID) | ORAL | Status: DC
Start: 2014-09-28 — End: 2014-10-03

## 2014-09-28 NOTE — Progress Notes (Signed)
Patient: Troy Gallagher Male    DOB: [birth year]   69 y.o.   MRN: [id number] Visit Date: 09/28/2014  Today's Provider: Lelon Huh, MD   Chief Complaint  Patient presents with  . Ear Pain  . Fever   Subjective:    HPI  Patient has pain in right arm pain for several months. Patient can only raise his arm waist high. He has been having  Pain in his right ear associated with sinus pressure and cough for the last week. Having some chills and noted to have low grade fever today.   Allergies  Allergen Reactions  . Sulfur Hives and Rash   Previous Medications   ACETAMINOPHEN (TYLENOL) 325 MG TABLET    Take 650 mg by mouth every 6 (six) hours as needed for mild pain or headache.   ALLOPURINOL (ZYLOPRIM) 100 MG TABLET    Take 100 mg by mouth daily.   ALPRAZOLAM (XANAX) 0.5 MG TABLET    Take 1 tablet (0.5 mg total) by mouth 2 (two) times daily.   AMLODIPINE (NORVASC) 10 MG TABLET    Take 10 mg by mouth daily.   ATORVASTATIN (LIPITOR) 40 MG TABLET    Take 40 mg by mouth daily at 6 PM.   AZELASTINE (ASTELIN) 0.1 % NASAL SPRAY    Place 2 sprays into both nostrils daily. Use in each nostril as directed   COLCHICINE 0.6 MG TABLET    Take 0.6 mg by mouth every other day.   CYCLOSPORINE (RESTASIS) 0.05 % OPHTHALMIC EMULSION    Place 1 drop into both eyes 2 (two) times daily.   DIPHENHYDRAMINE (BENADRYL) 25 MG TABLET    Take 25 mg by mouth at bedtime as needed for itching or sleep.   ESOMEPRAZOLE (NEXIUM) 40 MG CAPSULE    Take 1 capsule (40 mg total) by mouth 2 (two) times daily.   FERROUS SULFATE 325 (65 FE) MG TABLET    Take 325 mg by mouth daily with breakfast.   FUROSEMIDE (LASIX) 40 MG TABLET    Take 40 mg by mouth 2 (two) times daily. At 8am and 1400   IPRATROPIUM-ALBUTEROL (COMBIVENT IN)    Inhale 2 puffs into the lungs 3 (three) times daily as needed (SOB).   LORATADINE (CLARITIN) 10 MG TABLET    Take 10 mg by mouth every other day.   LOSARTAN (COZAAR) 100 MG TABLET    Take 100 mg by  mouth daily.   MECLIZINE (ANTIVERT) 25 MG TABLET    Take 25 mg by mouth 2 (two) times daily as needed for dizziness.   MULTIPLE VITAMIN (MULTIVITAMIN WITH MINERALS) TABS TABLET    Take 1 tablet by mouth daily.   POTASSIUM PO    Take 1 tablet by mouth daily.   ROPINIROLE (REQUIP) 3 MG TABLET    Take 3 mg by mouth at bedtime.    Review of Systems  HENT: Positive for congestion, ear pain and postnasal drip.        Left ear x2 weeks  Respiratory: Positive for cough, chest tightness and shortness of breath.   Neurological: Positive for dizziness and headaches.    Social History  Substance Use Topics  . Smoking status: Former Smoker -- 2.00 packs/day for 30 years    Types: Cigarettes    Quit date: 01/29/2002  . Smokeless tobacco: Not on file  . Alcohol Use: No   Objective:   BP 140/72 mmHg  Pulse 70  Temp(Src) 100.1  F (37.8 C) (Oral)  Resp 16  SpO2 95%  Physical Exam  General Appearance:    Alert, cooperative, no distress  HENT:   right TM fluid noted, neck without nodes, throat normal without erythema or exudate and fronal sinus tender  Eyes:    PERRL, conjunctiva/corneas clear, EOM's intact       Lungs:     Clear to auscultation bilaterally, respirations unlabored  Heart:    Regular rate and rhythm  Neurologic:   Awake, alert, oriented x 3. No apparent focal neurological           defect.       CXR: No acute changes      Assessment & Plan:     1. Cough  - DG Chest 2 View; Future  2. Frontal sinusitis, unspecified chronicity  - amoxicillin-clavulanate (AUGMENTIN) 875-125 MG per tablet; Take 1 tablet by mouth 2 (two) times daily.  Dispense: 20 tablet; Refill: 0       Lelon Huh, MD  [facility]

## 2014-09-29 ENCOUNTER — Telehealth: Payer: Self-pay

## 2014-09-29 ENCOUNTER — Inpatient Hospital Stay
Admission: EM | Admit: 2014-09-29 | Discharge: 2014-10-03 | DRG: 947 | Disposition: A | Discharge status: Home Health | Payer: Medicare Other | Attending: Internal Medicine | Admitting: Internal Medicine

## 2014-09-29 ENCOUNTER — Encounter: Payer: Self-pay | Admitting: Emergency Medicine

## 2014-09-29 DIAGNOSIS — I120 Hypertensive chronic kidney disease with stage 5 chronic kidney disease or end stage renal disease: Secondary | ICD-10-CM | POA: Diagnosis present

## 2014-09-29 DIAGNOSIS — Z992 Dependence on renal dialysis: Secondary | ICD-10-CM

## 2014-09-29 DIAGNOSIS — M12811 Other specific arthropathies, not elsewhere classified, right shoulder: Secondary | ICD-10-CM

## 2014-09-29 DIAGNOSIS — I10 Essential (primary) hypertension: Secondary | ICD-10-CM | POA: Diagnosis present

## 2014-09-29 DIAGNOSIS — Z8042 Family history of malignant neoplasm of prostate: Secondary | ICD-10-CM

## 2014-09-29 DIAGNOSIS — J449 Chronic obstructive pulmonary disease, unspecified: Secondary | ICD-10-CM | POA: Diagnosis present

## 2014-09-29 DIAGNOSIS — N2581 Secondary hyperparathyroidism of renal origin: Secondary | ICD-10-CM | POA: Diagnosis present

## 2014-09-29 DIAGNOSIS — E119 Type 2 diabetes mellitus without complications: Secondary | ICD-10-CM

## 2014-09-29 DIAGNOSIS — G2581 Restless legs syndrome: Secondary | ICD-10-CM | POA: Diagnosis present

## 2014-09-29 DIAGNOSIS — I739 Peripheral vascular disease, unspecified: Secondary | ICD-10-CM | POA: Diagnosis present

## 2014-09-29 DIAGNOSIS — E785 Hyperlipidemia, unspecified: Secondary | ICD-10-CM | POA: Diagnosis present

## 2014-09-29 DIAGNOSIS — Z8673 Personal history of transient ischemic attack (TIA), and cerebral infarction without residual deficits: Secondary | ICD-10-CM

## 2014-09-29 DIAGNOSIS — Z87891 Personal history of nicotine dependence: Secondary | ICD-10-CM

## 2014-09-29 DIAGNOSIS — Z823 Family history of stroke: Secondary | ICD-10-CM

## 2014-09-29 DIAGNOSIS — R7989 Other specified abnormal findings of blood chemistry: Secondary | ICD-10-CM

## 2014-09-29 DIAGNOSIS — F419 Anxiety disorder, unspecified: Secondary | ICD-10-CM | POA: Diagnosis present

## 2014-09-29 DIAGNOSIS — J019 Acute sinusitis, unspecified: Secondary | ICD-10-CM

## 2014-09-29 DIAGNOSIS — R52 Pain, unspecified: Secondary | ICD-10-CM

## 2014-09-29 DIAGNOSIS — D649 Anemia, unspecified: Secondary | ICD-10-CM | POA: Diagnosis present

## 2014-09-29 DIAGNOSIS — N400 Enlarged prostate without lower urinary tract symptoms: Secondary | ICD-10-CM | POA: Diagnosis present

## 2014-09-29 DIAGNOSIS — R0789 Other chest pain: Secondary | ICD-10-CM | POA: Diagnosis present

## 2014-09-29 DIAGNOSIS — R531 Weakness: Secondary | ICD-10-CM | POA: Diagnosis not present

## 2014-09-29 DIAGNOSIS — E1122 Type 2 diabetes mellitus with diabetic chronic kidney disease: Secondary | ICD-10-CM | POA: Diagnosis present

## 2014-09-29 DIAGNOSIS — M75101 Unspecified rotator cuff tear or rupture of right shoulder, not specified as traumatic: Secondary | ICD-10-CM | POA: Diagnosis present

## 2014-09-29 DIAGNOSIS — N186 End stage renal disease: Secondary | ICD-10-CM | POA: Diagnosis present

## 2014-09-29 DIAGNOSIS — R778 Other specified abnormalities of plasma proteins: Secondary | ICD-10-CM

## 2014-09-29 DIAGNOSIS — Z993 Dependence on wheelchair: Secondary | ICD-10-CM

## 2014-09-29 DIAGNOSIS — M109 Gout, unspecified: Secondary | ICD-10-CM | POA: Diagnosis present

## 2014-09-29 DIAGNOSIS — K219 Gastro-esophageal reflux disease without esophagitis: Secondary | ICD-10-CM | POA: Diagnosis present

## 2014-09-29 DIAGNOSIS — Z8249 Family history of ischemic heart disease and other diseases of the circulatory system: Secondary | ICD-10-CM

## 2014-09-29 DIAGNOSIS — R748 Abnormal levels of other serum enzymes: Secondary | ICD-10-CM | POA: Diagnosis present

## 2014-09-29 HISTORY — DX: Benign prostatic hyperplasia without lower urinary tract symptoms: N40.0

## 2014-09-29 HISTORY — DX: Type 2 diabetes mellitus without complications: E11.9

## 2014-09-29 HISTORY — DX: Anxiety disorder, unspecified: F41.9

## 2014-09-29 HISTORY — DX: Benign neoplasm of colon, unspecified: D12.6

## 2014-09-29 HISTORY — DX: Unspecified convulsions: R56.9

## 2014-09-29 HISTORY — DX: Hyperlipidemia, unspecified: E78.5

## 2014-09-29 HISTORY — DX: End stage renal disease: Z99.2

## 2014-09-29 HISTORY — DX: Restless legs syndrome: G25.81

## 2014-09-29 HISTORY — DX: Peripheral vascular disease, unspecified: I73.9

## 2014-09-29 HISTORY — DX: Chronic obstructive pulmonary disease, unspecified: J44.9

## 2014-09-29 HISTORY — DX: End stage renal disease: N18.6

## 2014-09-29 HISTORY — DX: Occlusion and stenosis of unspecified carotid artery: I65.29

## 2014-09-29 MED ORDER — CLARITHROMYCIN 500 MG PO TABS: 500.0000 mg | ORAL_TABLET | Freq: Two times a day (BID) | ORAL | Status: DC

## 2014-09-29 NOTE — Telephone Encounter (Signed)
-----   Message from Birdie Sons, MD sent at 09/28/2014  5:22 PM EDT ----- Please advise patient that chest XR is clear, no sign of lung infection. Fever is likely due to sinus infection and have sent prescription for Augmentin to [facility].

## 2014-09-29 NOTE — ED Notes (Signed)
Pt arrives via EMS from home.  Pt slid out of chair d/t weakness.  Pt c/o neck and right shoulder pain.  Pt denies LOC.  Pt is a dialysis patient and recieves dialysis on MWF.  Pt did not go to dialysis today.

## 2014-09-29 NOTE — Telephone Encounter (Signed)
Patient and patient's daughter Amy advised as directed below. Patient and patient's daughter verbalized understanding.

## 2014-09-29 NOTE — Telephone Encounter (Signed)
Received call from Amy, pt's daughter which stated that her dad had decided to take the Augmentin instead of the Clarithromycin but wants to know if you want him to hold  the Atorvastatin and Colchicine since he had decided to take the Augmentin.   Please advise,  Thanks.

## 2014-09-29 NOTE — Telephone Encounter (Signed)
Patient daughter Amy came to the office requesting that we change the antibiotic that was sent to the pharmacy to something else per pt request. Amy states that patient does not want to take the Augmentin because it has Amixicillin in it and per patient Amoxicillin does not work for him. He states the infection comes right back when taking Amoxicillin. I talked with Dr. Rosanna Randy who advised me to changes antibiotic to Clarithromycin 500mg  twice daily for 10 days. Prescription verbally called in and Amy was advised that rx has been changed. Amy was also advised that patient should put Atorvastatin and Colchicine on hold while taking the Clarithromycin. Marland Kitchen

## 2014-09-29 NOTE — Telephone Encounter (Signed)
Spoke with pt's daughter regarding the note below, pt verbalized fully understanding. Pt's daughter had also mentioned that he had missed his dialysis today and that he was feeling drowsy and weak;  she was advised to schedule him an appointment for today, but pt refused, she stated that if he does not feel any better in the morning that she will call.  Thanks,

## 2014-09-30 ENCOUNTER — Emergency Department: Payer: Medicare Other

## 2014-09-30 ENCOUNTER — Observation Stay: Payer: Medicare Other

## 2014-09-30 ENCOUNTER — Encounter: Payer: Self-pay | Admitting: Internal Medicine

## 2014-09-30 DIAGNOSIS — K219 Gastro-esophageal reflux disease without esophagitis: Secondary | ICD-10-CM | POA: Diagnosis present

## 2014-09-30 DIAGNOSIS — E119 Type 2 diabetes mellitus without complications: Secondary | ICD-10-CM

## 2014-09-30 DIAGNOSIS — R778 Other specified abnormalities of plasma proteins: Secondary | ICD-10-CM | POA: Diagnosis present

## 2014-09-30 DIAGNOSIS — R7989 Other specified abnormal findings of blood chemistry: Secondary | ICD-10-CM

## 2014-09-30 DIAGNOSIS — N186 End stage renal disease: Secondary | ICD-10-CM

## 2014-09-30 DIAGNOSIS — I10 Essential (primary) hypertension: Secondary | ICD-10-CM | POA: Diagnosis present

## 2014-09-30 DIAGNOSIS — E785 Hyperlipidemia, unspecified: Secondary | ICD-10-CM | POA: Diagnosis present

## 2014-09-30 DIAGNOSIS — Z992 Dependence on renal dialysis: Secondary | ICD-10-CM

## 2014-09-30 DIAGNOSIS — G2581 Restless legs syndrome: Secondary | ICD-10-CM | POA: Diagnosis present

## 2014-09-30 DIAGNOSIS — F419 Anxiety disorder, unspecified: Secondary | ICD-10-CM | POA: Diagnosis present

## 2014-09-30 DIAGNOSIS — R531 Weakness: Secondary | ICD-10-CM

## 2014-09-30 LAB — BASIC METABOLIC PANEL
Anion gap: 14 (ref 5–15)
BUN: 67 mg/dL — AB (ref 6–20)
CHLORIDE: 102 mmol/L (ref 101–111)
CO2: 23 mmol/L (ref 22–32)
CREATININE: 8.61 mg/dL — AB (ref 0.61–1.24)
Calcium: 9.3 mg/dL (ref 8.9–10.3)
GFR calc Af Amer: 6 mL/min — ABNORMAL LOW (ref >60)
GFR calc non Af Amer: 5 mL/min — ABNORMAL LOW (ref >60)
GLUCOSE: 92 mg/dL (ref 65–99)
POTASSIUM: 3.6 mmol/L (ref 3.5–5.1)
Sodium: 139 mmol/L (ref 135–145)

## 2014-09-30 LAB — PHOSPHORUS: PHOSPHORUS: 6.5 mg/dL — AB (ref 2.5–4.6)

## 2014-09-30 LAB — RENAL FUNCTION PANEL
ANION GAP: 16 — AB (ref 5–15)
Albumin: 3.1 g/dL — ABNORMAL LOW (ref 3.5–5.0)
BUN: 65 mg/dL — ABNORMAL HIGH (ref 6–20)
CO2: 21 mmol/L — AB (ref 22–32)
Calcium: 9.6 mg/dL (ref 8.9–10.3)
Chloride: 101 mmol/L (ref 101–111)
Creatinine, Ser: 8.56 mg/dL — ABNORMAL HIGH (ref 0.61–1.24)
GFR calc non Af Amer: 6 mL/min — ABNORMAL LOW (ref >60)
GFR, EST AFRICAN AMERICAN: 6 mL/min — AB (ref >60)
GLUCOSE: 71 mg/dL (ref 65–99)
POTASSIUM: 4.1 mmol/L (ref 3.5–5.1)
Phosphorus: 5.5 mg/dL — ABNORMAL HIGH (ref 2.5–4.6)
Sodium: 138 mmol/L (ref 135–145)

## 2014-09-30 LAB — COMPREHENSIVE METABOLIC PANEL
ALBUMIN: 3.4 g/dL — AB (ref 3.5–5.0)
ALT: 12 U/L — ABNORMAL LOW (ref 17–63)
AST: 18 U/L (ref 15–41)
Alkaline Phosphatase: 66 U/L (ref 38–126)
Anion gap: 12 (ref 5–15)
BILIRUBIN TOTAL: 0.3 mg/dL (ref 0.3–1.2)
BUN: 64 mg/dL — AB (ref 6–20)
CHLORIDE: 101 mmol/L (ref 101–111)
CO2: 26 mmol/L (ref 22–32)
Calcium: 9.4 mg/dL (ref 8.9–10.3)
Creatinine, Ser: 8.38 mg/dL — ABNORMAL HIGH (ref 0.61–1.24)
GFR calc Af Amer: 7 mL/min — ABNORMAL LOW (ref >60)
GFR calc non Af Amer: 6 mL/min — ABNORMAL LOW (ref >60)
GLUCOSE: 102 mg/dL — AB (ref 65–99)
POTASSIUM: 3.9 mmol/L (ref 3.5–5.1)
SODIUM: 139 mmol/L (ref 135–145)
Total Protein: 7.6 g/dL (ref 6.5–8.1)

## 2014-09-30 LAB — MAGNESIUM: MAGNESIUM: 1.9 mg/dL (ref 1.7–2.4)

## 2014-09-30 LAB — CREATININE, SERUM
Creatinine, Ser: 8.57 mg/dL — ABNORMAL HIGH (ref 0.61–1.24)
GFR calc non Af Amer: 6 mL/min — ABNORMAL LOW (ref >60)
GFR, EST AFRICAN AMERICAN: 6 mL/min — AB (ref >60)

## 2014-09-30 LAB — PROTIME-INR
INR: 1.16
Prothrombin Time: 15 seconds (ref 11.4–15.0)

## 2014-09-30 LAB — CBC
HEMATOCRIT: 30.3 % — AB (ref 40.0–52.0)
HEMATOCRIT: 29.6 % — AB (ref 40.0–52.0)
HEMOGLOBIN: 9.5 g/dL — AB (ref 13.0–18.0)
Hemoglobin: 10.1 g/dL — ABNORMAL LOW (ref 13.0–18.0)
MCH: 31.5 pg (ref 26.0–34.0)
MCH: 30.6 pg (ref 26.0–34.0)
MCHC: 33.2 g/dL (ref 32.0–36.0)
MCHC: 32.3 g/dL (ref 32.0–36.0)
MCV: 94.9 fL (ref 80.0–100.0)
MCV: 95 fL (ref 80.0–100.0)
PLATELETS: 319 10*3/uL (ref 150–440)
Platelets: 316 10*3/uL (ref 150–440)
RBC: 3.2 MIL/uL — ABNORMAL LOW (ref 4.40–5.90)
RBC: 3.12 MIL/uL — AB (ref 4.40–5.90)
RDW: 15.1 % — AB (ref 11.5–14.5)
RDW: 15 % — ABNORMAL HIGH (ref 11.5–14.5)
WBC: 8.8 10*3/uL (ref 3.8–10.6)
WBC: 8.8 10*3/uL (ref 3.8–10.6)

## 2014-09-30 LAB — TROPONIN I
TROPONIN I: 0.04 ng/mL — AB (ref <0.031)
Troponin I: 0.03 ng/mL (ref <0.031)
Troponin I: 0.04 ng/mL — ABNORMAL HIGH (ref <0.031)

## 2014-09-30 LAB — HEMOGLOBIN A1C: Hgb A1c MFr Bld: 5.5 % (ref 4.0–6.0)

## 2014-09-30 LAB — TSH: TSH: 2.421 u[IU]/mL (ref 0.350–4.500)

## 2014-09-30 MED ORDER — ROPINIROLE HCL 1 MG PO TABS
3.0000 mg | ORAL_TABLET | Freq: Every day | ORAL | Status: DC
  Administered 2014-09-30 – 2014-10-02 (×3): 3 mg via ORAL
  Filled 2014-09-30 (×3): qty 3

## 2014-09-30 MED ORDER — ALTEPLASE 2 MG IJ SOLR
2.0000 mg | Freq: Once | INTRAMUSCULAR | Status: DC | PRN
  Filled 2014-09-30: qty 2

## 2014-09-30 MED ORDER — FUROSEMIDE 40 MG PO TABS
40.0000 mg | ORAL_TABLET | Freq: Two times a day (BID) | ORAL | Status: DC
  Administered 2014-09-30 – 2014-10-03 (×5): 40 mg via ORAL
  Filled 2014-09-30 (×5): qty 1

## 2014-09-30 MED ORDER — ONDANSETRON HCL 4 MG/2ML IJ SOLN: 4.0000 mg | Freq: Four times a day (QID) | INTRAMUSCULAR | Status: DC | PRN

## 2014-09-30 MED ORDER — LIDOCAINE-PRILOCAINE 2.5-2.5 % EX CREA: 1.0000 "application " | TOPICAL_CREAM | CUTANEOUS | Status: DC | PRN

## 2014-09-30 MED ORDER — LIDOCAINE HCL (PF) 1 % IJ SOLN
5.0000 mL | INTRAMUSCULAR | Status: DC | PRN
  Filled 2014-09-30: qty 5

## 2014-09-30 MED ORDER — AMLODIPINE BESYLATE 10 MG PO TABS
10.0000 mg | ORAL_TABLET | Freq: Every day | ORAL | Status: DC
  Administered 2014-09-30 – 2014-10-03 (×4): 10 mg via ORAL
  Filled 2014-09-30 (×4): qty 1

## 2014-09-30 MED ORDER — HYDRALAZINE HCL 50 MG PO TABS
50.0000 mg | ORAL_TABLET | Freq: Three times a day (TID) | ORAL | Status: DC
  Administered 2014-09-30 – 2014-10-03 (×9): 50 mg via ORAL
  Filled 2014-09-30 (×9): qty 1

## 2014-09-30 MED ORDER — COLCHICINE 0.6 MG PO TABS: 0.6000 mg | ORAL_TABLET | ORAL | Status: DC

## 2014-09-30 MED ORDER — HEPARIN SODIUM (PORCINE) 5000 UNIT/ML IJ SOLN
5000.0000 [IU] | Freq: Three times a day (TID) | INTRAMUSCULAR | Status: DC
  Administered 2014-09-30 – 2014-10-02 (×6): 5000 [IU] via SUBCUTANEOUS
  Filled 2014-09-30 (×5): qty 1

## 2014-09-30 MED ORDER — ACETAMINOPHEN 325 MG PO TABS
650.0000 mg | ORAL_TABLET | Freq: Four times a day (QID) | ORAL | Status: DC | PRN
  Administered 2014-10-01 (×2): 650 mg via ORAL
  Filled 2014-09-30 (×2): qty 2

## 2014-09-30 MED ORDER — SODIUM CHLORIDE 0.9 % IV SOLN: 100.0000 mL | INTRAVENOUS | Status: DC | PRN

## 2014-09-30 MED ORDER — SENNOSIDES-DOCUSATE SODIUM 8.6-50 MG PO TABS: 1.0000 | ORAL_TABLET | Freq: Every evening | ORAL | Status: DC | PRN

## 2014-09-30 MED ORDER — ACETAMINOPHEN 650 MG RE SUPP: 650.0000 mg | Freq: Four times a day (QID) | RECTAL | Status: DC | PRN

## 2014-09-30 MED ORDER — HYDRALAZINE HCL 20 MG/ML IJ SOLN
10.0000 mg | INTRAMUSCULAR | Status: DC | PRN
  Administered 2014-10-01 (×2): 10 mg via INTRAVENOUS
  Filled 2014-09-30 (×3): qty 1

## 2014-09-30 MED ORDER — ALPRAZOLAM 0.5 MG PO TABS
0.5000 mg | ORAL_TABLET | Freq: Two times a day (BID) | ORAL | Status: DC
  Administered 2014-09-30 – 2014-10-03 (×6): 0.5 mg via ORAL
  Filled 2014-09-30 (×6): qty 1

## 2014-09-30 MED ORDER — CYCLOSPORINE 0.05 % OP EMUL
1.0000 [drp] | Freq: Two times a day (BID) | OPHTHALMIC | Status: DC
  Administered 2014-09-30: 2 [drp] via OPHTHALMIC
  Administered 2014-10-01 – 2014-10-03 (×5): 1 [drp] via OPHTHALMIC
  Filled 2014-09-30 (×8): qty 1

## 2014-09-30 MED ORDER — SODIUM CHLORIDE 0.9 % IV SOLN
INTRAVENOUS | Status: AC
  Administered 2014-09-30: 06:00:00 via INTRAVENOUS

## 2014-09-30 MED ORDER — LABETALOL HCL 5 MG/ML IV SOLN: 10.0000 mg | INTRAVENOUS | Status: DC | PRN

## 2014-09-30 MED ORDER — HEPARIN SODIUM (PORCINE) 1000 UNIT/ML DIALYSIS
1000.0000 [IU] | INTRAMUSCULAR | Status: DC | PRN
  Filled 2014-09-30: qty 1

## 2014-09-30 MED ORDER — IPRATROPIUM-ALBUTEROL 0.5-2.5 (3) MG/3ML IN SOLN: 3.0000 mL | Freq: Four times a day (QID) | RESPIRATORY_TRACT | Status: DC | PRN

## 2014-09-30 MED ORDER — ATORVASTATIN CALCIUM 20 MG PO TABS
40.0000 mg | ORAL_TABLET | Freq: Every day | ORAL | Status: DC
  Administered 2014-09-30 – 2014-10-02 (×3): 40 mg via ORAL
  Filled 2014-09-30 (×3): qty 2

## 2014-09-30 MED ORDER — LOSARTAN POTASSIUM 50 MG PO TABS
100.0000 mg | ORAL_TABLET | Freq: Every day | ORAL | Status: DC
  Administered 2014-09-30 – 2014-10-03 (×4): 100 mg via ORAL
  Filled 2014-09-30 (×4): qty 2

## 2014-09-30 MED ORDER — PENTAFLUOROPROP-TETRAFLUOROETH EX AERO: 1.0000 "application " | INHALATION_SPRAY | CUTANEOUS | Status: DC | PRN

## 2014-09-30 MED ORDER — HYDROCODONE-ACETAMINOPHEN 5-325 MG PO TABS
1.0000 | ORAL_TABLET | ORAL | Status: DC | PRN
  Administered 2014-10-01: 1 via ORAL
  Administered 2014-10-02: 2 via ORAL
  Filled 2014-09-30: qty 1
  Filled 2014-09-30: qty 2

## 2014-09-30 MED ORDER — ONDANSETRON HCL 4 MG PO TABS: 4.0000 mg | ORAL_TABLET | Freq: Four times a day (QID) | ORAL | Status: DC | PRN

## 2014-09-30 MED ORDER — PANTOPRAZOLE SODIUM 40 MG PO TBEC
40.0000 mg | DELAYED_RELEASE_TABLET | Freq: Every day | ORAL | Status: DC
  Administered 2014-10-01 – 2014-10-03 (×3): 40 mg via ORAL
  Filled 2014-09-30 (×3): qty 1

## 2014-09-30 MED ORDER — SODIUM CHLORIDE 0.9 % IJ SOLN
3.0000 mL | Freq: Two times a day (BID) | INTRAMUSCULAR | Status: DC
  Administered 2014-09-30 – 2014-10-01 (×2): 3 mL via INTRAVENOUS

## 2014-09-30 NOTE — Progress Notes (Signed)
PRE HD   09/30/14 1215  Neurological  Level of Consciousness Alert  Orientation Level Oriented X4  Respiratory  Respiratory Pattern Regular;Unlabored  Chest Assessment Chest expansion symmetrical  Bilateral Breath Sounds Clear;Diminished  Cardiac  Pulse Regular  ECG Monitor Yes  Cardiac Rhythm NSR  Vascular  Edema Right lower extremity;Left lower extremity  RLE Edema +1  LLE Edema +1  Integumentary  Integumentary (WDL) WDL  Musculoskeletal  Musculoskeletal (WDL) WDL  Gastrointestinal  Bowel Sounds Assessment Active  GU Assessment  Genitourinary (WDL) WDL (HD)  Psychosocial  Psychosocial (WDL) WDL

## 2014-09-30 NOTE — Progress Notes (Signed)
[facility]  ROUNDING NOTE   Subjective:  Pt admitted with weakness.   He is a rather poor historian and can't offer details reagarding this. Apparently weakness has occurred after dialsyis.  He's currently dialyzing at [facility].   He recently started physical therapy.  Discussed with nursing at dialysis center. No signficant intradilaytic hypotension noted there. Objective:  Vital signs in last 24 hours:  Temp:  [98.6 F (37 C)-99.1 F (37.3 C)] 98.6 F (37 C) (09/01 0457) Pulse Rate:  [72-80] 80 (09/01 0457) Resp:  [17-24] 18 (09/01 0457) BP: (151-233)/(58-202) 177/59 mmHg (09/01 0457) SpO2:  [94 %-98 %] 97 % (09/01 0457) Weight:  [105.824 kg (233 lb 4.8 oz)-108.863 kg (240 lb)] 105.824 kg (233 lb 4.8 oz) (09/01 0457)  Weight change:  Filed Weights   09/29/14 2353 09/30/14 0457  Weight: 108.863 kg (240 lb) 105.824 kg (233 lb 4.8 oz)    Intake/Output: I/O last 3 completed shifts: In: -  Out: 350 [Urine:350]   Intake/Output this shift:  Total I/O In: 480 [P.O.:480] Out: -   Physical Exam: General: NAD, resting in bed  Head: Normocephalic, atraumatic. Moist oral mucosal membranes  Eyes: Anicteric, PERRL  Neck: Supple, trachea midline  Lungs:  Clear to auscultation normal effort  Heart: Regular rate and rhythm  Abdomen:  Soft, nontender, BS present  Extremities:  trace peripheral edema.  Neurologic: Nonfocal, moving all four extremities  Skin: No lesions  Access: LUE AVF    Basic Metabolic Panel:  Recent Labs Lab 09/30/14 0005 09/30/14 0516  NA 139 139  K 3.9 3.6  CL 101 102  CO2 26 23  GLUCOSE 102* 92  BUN 64* 67*  CREATININE 8.38* 8.57*  8.61*  CALCIUM 9.4 9.3  MG  --  1.9  PHOS  --  6.5*    Liver Function Tests:  Recent Labs Lab 09/30/14 0005  AST 18  ALT 12*  ALKPHOS 66  BILITOT 0.3  PROT 7.6  ALBUMIN 3.4*   No results for input(s): LIPASE, AMYLASE in the last 168 hours. No results for input(s): AMMONIA in the  last 168 hours.  CBC:  Recent Labs Lab 09/30/14 0516  WBC 8.8  HGB 10.1*  HCT 30.3*  MCV 94.9  PLT 319    Cardiac Enzymes:  Recent Labs Lab 09/30/14 0005 09/30/14 0516  TROPONINI 0.04* 0.04*    BNP: Invalid input(s): POCBNP  CBG: No results for input(s): GLUCAP in the last 168 hours.  Microbiology: Results for orders placed or performed during the hospital encounter of 10/24/09  Surgical pcr screen     Status: None   Collection Time: 10/19/09  2:01 PM  Result Value Ref Range Status   MRSA, PCR NEGATIVE NEGATIVE Final   Staphylococcus aureus  NEGATIVE Final    NEGATIVE        The Xpert SA Assay (FDA approved for NASAL specimens only), is one component of a comprehensive surveillance program.  It is not intended to diagnose infection nor to guide or monitor treatment.    Coagulation Studies:  Recent Labs  09/30/14 0005  LABPROT 15.0  INR 1.16    Urinalysis: No results for input(s): COLORURINE, LABSPEC, PHURINE, GLUCOSEU, HGBUR, BILIRUBINUR, KETONESUR, PROTEINUR, UROBILINOGEN, NITRITE, LEUKOCYTESUR in the last 72 hours.  Invalid input(s): APPERANCEUR    Imaging: Dg Chest 2 View  09/28/2014   CLINICAL DATA:  Productive cough and fever.  Asthma.  EXAM: CHEST  2 VIEW  COMPARISON:  04/13/2014  FINDINGS: Mild cardiomegaly  remains stable. No evidence of pulmonary infiltrate or edema. No evidence of pleural effusion. Cervical spine fusion hardware again noted.  IMPRESSION: Stable mild cardiomegaly.  No active lung disease.   Electronically Signed   By: Earle Gell M.D.   On: 09/28/2014 16:44   Dg Shoulder Right  09/30/2014   CLINICAL DATA:  Acute onset of right shoulder pain. Slid out of chair due to weakness. Initial encounter.  EXAM: RIGHT SHOULDER - 2+ VIEW  COMPARISON:  Right shoulder radiographs performed 04/10/2014  FINDINGS: There is no evidence of fracture or dislocation. The right humeral head is seated within the glenoid fossa. Mild sclerotic change is  noted at the right glenohumeral joint. Mild degenerative change is noted at the right acromioclavicular joint. No significant soft tissue abnormalities are seen. The visualized portions of the right lung are clear.  IMPRESSION: No evidence of fracture or dislocation. Mild chronic sclerotic change at the right glenohumeral joint.   Electronically Signed   By: Garald Balding M.D.   On: 09/30/2014 01:00   Ct Head Wo Contrast  09/30/2014   CLINICAL DATA:  Initial evaluation for acute trauma, fall.  EXAM: CT HEAD WITHOUT CONTRAST  CT CERVICAL SPINE WITHOUT CONTRAST  TECHNIQUE: Multidetector CT imaging of the head and cervical spine was performed following the standard protocol without intravenous contrast. Multiplanar CT image reconstructions of the cervical spine were also generated.  COMPARISON:  Prior study from 04/10/2014.  FINDINGS: CT HEAD FINDINGS  Generalized cerebral atrophy with chronic microvascular ischemic disease present, stable from previous. Vascular calcifications noted within the carotid siphons and distal left MCA, stable. 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  No acute large vessel territory infarct. No intracranial hemorrhage. No mass lesion, midline shift, or mass effect. Ventricular prominence related to global parenchymal volume loss present without hydrocephalus.  Scalp soft tissues within normal limits. No acute abnormality about the orbits.  Calvarium intact. Paranasal sinuses and mastoid air cells are clear.  CT CERVICAL SPINE FINDINGS  Patient is status post ACDF at the C3 through C6 levels. Hardware appears grossly intact. There appears to be complete fusion at these levels. There is 3 mm anterolisthesis of C2 on C3. Trace 2 mm anterolisthesis of C6 on C7. This is likely chronic. No fracture irregularity at the spinous process of C7 appears chronic in nature. Normal C1-2 articulations intact. Prevertebral soft tissues normal.  Multilevel degenerative disc disease and facet  arthropathy present within the cervical spine, most prevalent about the C1-2 articulation.  No acute soft tissue abnormality.  Visualized lungs are clear.  IMPRESSION: CT BRAIN:  1. No acute intracranial process. 2. Advanced age-related cerebral atrophy with chronic microvascular ischemic disease, stable from prior.  CT CERVICAL SPINE:  1. No acute traumatic injury within the cervical spine. 2. Status post ACDF at C3 through C6 without complication.   Electronically Signed   By: Jeannine Boga M.D.   On: 09/30/2014 01:36   Ct Cervical Spine Wo Contrast  09/30/2014   CLINICAL DATA:  Initial evaluation for acute trauma, fall.  EXAM: CT HEAD WITHOUT CONTRAST  CT CERVICAL SPINE WITHOUT CONTRAST  TECHNIQUE: Multidetector CT imaging of the head and cervical spine was performed following the standard protocol without intravenous contrast. Multiplanar CT image reconstructions of the cervical spine were also generated.  COMPARISON:  Prior study from 04/10/2014.  FINDINGS: CT HEAD FINDINGS  Generalized cerebral atrophy with chronic microvascular ischemic disease present, stable from previous. Vascular calcifications noted within the carotid siphons and distal  left MCA, stable. 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  No acute large vessel territory infarct. No intracranial hemorrhage. No mass lesion, midline shift, or mass effect. Ventricular prominence related to global parenchymal volume loss present without hydrocephalus.  Scalp soft tissues within normal limits. No acute abnormality about the orbits.  Calvarium intact. Paranasal sinuses and mastoid air cells are clear.  CT CERVICAL SPINE FINDINGS  Patient is status post ACDF at the C3 through C6 levels. Hardware appears grossly intact. There appears to be complete fusion at these levels. There is 3 mm anterolisthesis of C2 on C3. Trace 2 mm anterolisthesis of C6 on C7. This is likely chronic. No fracture irregularity at the spinous process of C7  appears chronic in nature. Normal C1-2 articulations intact. Prevertebral soft tissues normal.  Multilevel degenerative disc disease and facet arthropathy present within the cervical spine, most prevalent about the C1-2 articulation.  No acute soft tissue abnormality.  Visualized lungs are clear.  IMPRESSION: CT BRAIN:  1. No acute intracranial process. 2. Advanced age-related cerebral atrophy with chronic microvascular ischemic disease, stable from prior.  CT CERVICAL SPINE:  1. No acute traumatic injury within the cervical spine. 2. Status post ACDF at C3 through C6 without complication.   Electronically Signed   By: Jeannine Boga M.D.   On: 09/30/2014 01:36     Medications:   . sodium chloride 75 mL/hr at 09/30/14 0601   . ALPRAZolam  0.5 mg Oral BID  . amLODipine  10 mg Oral Daily  . atorvastatin  40 mg Oral q1800  . colchicine  0.6 mg Oral QODAY  . cycloSPORINE  1 drop Both Eyes BID  . furosemide  40 mg Oral BID  . heparin  5,000 Units Subcutaneous 3 times per day  . losartan  100 mg Oral Daily  . pantoprazole  40 mg Oral QAC breakfast  . rOPINIRole  3 mg Oral QHS  . sodium chloride  3 mL Intravenous Q12H   acetaminophen **OR** acetaminophen, hydrALAZINE, HYDROcodone-acetaminophen, ipratropium-albuterol, labetalol, ondansetron **OR** ondansetron (ZOFRAN) IV, senna-docusate  Assessment/ Plan:  69 y.o. male  With end-stage renal disease on hemodialysis Monday, Wednesday, Friday, hypertension, COPD, GERD, gout, left upper extremity AV fistula, restless leg syndrome, hyperlipidemia who was admitted for generalized weakness.  1.  End-stage renal disease on hemodialysis Monday, Wednesday, Friday. The patient missed his regularly scheduled dialysis treatment yesterday secondary to weakness.  Therefore we will plan for hemodialysis treatment today.  We will keep a conservative ultrafiltration goal of 1.5 kg.  2. Anemia chronic kidney disease.  Hemoglobin noted as being 10.1.  Start the  patient on Epogen 4000 units IV with dialysis.  3.  Secondary hyperparathyroidism. Check intact PTH and phosphorus with dialysis today.  4.  Generalized weakness.  Cause of this currently unclear.  No intradialytic hypotension noted per nursing at the dialysis Center.  Blood pressure in fact appears to be a bit high at present.  Recommend physical therapy evaluation.  5. HTN:  Continue amlodipine, losartan at their current doses.   LOS:  Elston Aldape 9/1/201610:36 AM

## 2014-09-30 NOTE — ED Provider Notes (Signed)
[facility] Emergency Department Provider Note  ____________________________________________  Time seen: 11:50 PM  I have reviewed the triage vital signs and the nursing notes.   HISTORY  Chief Complaint Weakness      HPI Troy Gallagher is a 69 y.o. male presents here to EMS with history of generalized weakness patient states that he slid out of his chair. She states that he did not injure his head during the fall however does admit to right shoulder pain. In addition patient states that he is at intermittent chest discomfort currently 4 out of 10. Also patient states that he receives dialysis on Tuesday Thursday and Saturday but unfortunately missed his dialysis on Tuesday. Patient denies any shortness of breath.    Past Medical History  Diagnosis Date  . Complication of anesthesia   . Hypertension   . Stroke   . GERD (gastroesophageal reflux disease)   . Headache   . Kidney dialysis status [id number]  . HLD (hyperlipidemia)   . Diabetes mellitus, type II   . Seizure   . BPH (benign prostatic hyperplasia)   . COPD (chronic obstructive pulmonary disease)   . PVD (peripheral vascular disease)   . Carotid stenosis   . Adenoma of large intestine   . ESRD on hemodialysis     MWF  . Anxiety   . RLS (restless legs syndrome)     Patient Active Problem List   Diagnosis Date Noted  . Elevated troponin 09/30/2014  . Weakness generalized 09/30/2014  . ESRD on hemodialysis 09/30/2014  . GERD (gastroesophageal reflux disease) 09/30/2014  . HTN (hypertension) 09/30/2014  . HLD (hyperlipidemia) 09/30/2014  . Type 2 diabetes mellitus 09/30/2014  . Anxiety 09/30/2014  . RLS (restless legs syndrome) 09/30/2014  . History of CVA (cerebrovascular accident) 09/28/2014  . Symptoms, musculoskeletal, limb NEC 09/28/2014  . Anemia 09/28/2014  . Hyperlipidemia, mixed 09/28/2014  . Bilateral carotid artery stenosis 12/04/2011  . Depressive disorder 01/29/1998     Past Surgical History  Procedure Laterality Date  . Shoulder arthroscopy w/ rotator cuff repair Left 1999  . Neck surgery N/A 10/24/2009    Cervical Diskectomy- C3-4, 4-5, 5-6 anterior disckectomy by Dr. Arnoldo Morale at [facility]  . Av fistula placement Left 2 years  . Colonoscopy N/A 06/14/2014    Procedure: COLONOSCOPY;  Surgeon: Hulen Luster, MD;  Location: [facility] ENDOSCOPY;  Service: Gastroenterology;  Laterality: N/A;  . Carotid endarterectomy Right 06/05/2013    Dr. Delana Meyer  . Tonsillectomy and adenoidectomy  1950  . 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 Repeat in 3 years  . Carotid doppler ultrasound  04/22/2013    85-90% stenosis of the RICA, 60-70% stenosis of left cmmon carotid and origin of left subclavian  . Doppler echocardiography  12/04/2011    Bilateral stenosis 80%  . Arch aortogram  04/07/2013    85-90% Stenosis origin of right ICA. 65-70% narrowing of left CCA. 50-60% narrowing of left subclavian artery    No current outpatient prescriptions on file.  Allergies Sulfur  Family History  Problem Relation Age of Onset  . Stroke Mother   . Prostate cancer Father   . Heart disease Sister     Social History Social History  Substance Use Topics  . Smoking status: Former Smoker -- 2.00 packs/day for 30 years    Types: Cigarettes    Quit date: 01/29/2002  . Smokeless tobacco: Current User  . Alcohol Use:  No    Review of Systems  Constitutional: Negative for fever. Eyes: Negative for visual changes. ENT: Negative for sore throat. Cardiovascular: Positive for chest pain. Respiratory: Negative for shortness of breath. Gastrointestinal: Negative for abdominal pain, vomiting and diarrhea. Genitourinary: Negative for dysuria. Musculoskeletal: Negative for back pain. Positive right shoulder discomfort Skin: Negative for rash. Neurological: Negative for headaches, positive for generalized  weakness.   10-point ROS otherwise negative.  ____________________________________________   PHYSICAL EXAM:  VITAL SIGNS: ED Triage Vitals  Enc Vitals Group     BP 09/29/14 2353 233/202 mmHg     Pulse Rate 09/29/14 2353 75     Resp 09/29/14 2353 20     Temp 09/29/14 2353 99.1 F (37.3 C)     Temp Source 09/29/14 2353 Oral     SpO2 09/29/14 2353 96 %     Weight 09/29/14 2353 240 lb (108.863 kg)     Height 09/29/14 2353 5\' 9"  (1.753 m)     Head Cir --      Peak Flow --      Pain Score 09/29/14 2354 10     Pain Loc --      Pain Edu? --      Excl. in [place]? --      Constitutional: Alert and oriented. Well appearing and in no distress. Eyes: Conjunctivae are normal. PERRL. Normal extraocular movements. ENT   Head: Normocephalic and atraumatic.   Nose: No congestion/rhinnorhea.   Mouth/Throat: Mucous membranes are moist.   Neck: No stridor. Hematological/Lymphatic/Immunilogical: No cervical lymphadenopathy. Cardiovascular: Normal rate, regular rhythm. Normal and symmetric distal pulses are present in all extremities. No murmurs, rubs, or gallops. Respiratory: Normal respiratory effort without tachypnea nor retractions. Breath sounds are clear and equal bilaterally. No wheezes/rales/rhonchi. Gastrointestinal: Soft and nontender. No distention. There is no CVA tenderness. Genitourinary: deferred Musculoskeletal: Nontender with normal range of motion in all extremities. No joint effusions.  No lower extremity tenderness nor edema. Neurologic:  Normal speech and language. No gross focal neurologic deficits are appreciated. Speech is normal.  Skin:  Skin is warm, dry and intact. No rash noted. Psychiatric: Mood and affect are normal. Speech and behavior are normal. Patient exhibits appropriate insight and judgment.  ____________________________________________    LABS (pertinent positives/negatives)  Labs Reviewed  COMPREHENSIVE METABOLIC PANEL - Abnormal; Notable  for the following:    Glucose, Bld 102 (*)    BUN 64 (*)    Creatinine, Ser 8.38 (*)    Albumin 3.4 (*)    ALT 12 (*)    GFR calc non Af Amer 6 (*)    GFR calc Af Amer 7 (*)    All other components within normal limits  TROPONIN I - Abnormal; Notable for the following:    Troponin I 0.04 (*)    All other components within normal limits  PROTIME-INR  CBC  CREATININE, SERUM  MAGNESIUM  PHOSPHORUS  TSH  TROPONIN I  TROPONIN I  TROPONIN I  HEMOGLOBIN A8T  BASIC METABOLIC PANEL     ____________________________________________   EKG  ED ECG REPORT I, Jozlin Bently, [place], the attending physician, personally viewed and interpreted this ECG.   Date: 09/30/2014  EKG Time: 11:55 PM  Rate: 74  Rhythm: Normal sinus rhythm  Axis: None  Intervals: Normal  ST&T Change: None   ____________________________________________    RADIOLOGY   CT HEAD FINDINGS  Generalized cerebral atrophy with chronic microvascular ischemic disease present, stable from previous. Vascular calcifications noted within the carotid  siphons and distal left MCA, stable. 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  No acute large vessel territory infarct. No intracranial hemorrhage. No mass lesion, midline shift, or mass effect. Ventricular prominence related to global parenchymal volume loss present without hydrocephalus.  Scalp soft tissues within normal limits. No acute abnormality about the orbits.  Calvarium intact. Paranasal sinuses and mastoid air cells are clear.  CT CERVICAL SPINE FINDINGS  Patient is status post ACDF at the C3 through C6 levels. Hardware appears grossly intact. There appears to be complete fusion at these levels. There is 3 mm anterolisthesis of C2 on C3. Trace 2 mm anterolisthesis of C6 on C7. This is likely chronic. No fracture irregularity at the spinous process of C7 appears chronic in nature. Normal C1-2 articulations intact. Prevertebral soft tissues  normal.  Multilevel degenerative disc disease and facet arthropathy present within the cervical spine, most prevalent about the C1-2 articulation.  No acute soft tissue abnormality. Visualized lungs are clear.  IMPRESSION: CT BRAIN:  1. No acute intracranial process. 2. Advanced age-related cerebral atrophy with chronic microvascular ischemic disease, stable from prior.  CT CERVICAL SPINE:  1. No acute traumatic injury within the cervical spine. 2. Status post ACDF at C3 through C6 without complication.   Electronically Signed By: Jeannine Boga M.D. On: 09/30/2014 01:36          CT Head Wo Contrast (Final result) Result time: 09/30/14 01:36:55   Final result by Rad Results In Interface (09/30/14 01:36:55)   Narrative:   CLINICAL DATA: Initial evaluation for acute trauma, fall.  EXAM: CT HEAD WITHOUT CONTRAST  CT CERVICAL SPINE WITHOUT CONTRAST  TECHNIQUE: Multidetector CT imaging of the head and cervical spine was performed following the standard protocol without intravenous contrast. Multiplanar CT image reconstructions of the cervical spine were also generated.  COMPARISON: Prior study from 04/10/2014.  FINDINGS: CT HEAD FINDINGS  Generalized cerebral atrophy with chronic microvascular ischemic disease present, stable from previous. Vascular calcifications noted within the carotid siphons and distal left MCA, stable. 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  No acute large vessel territory infarct. No intracranial hemorrhage. No mass lesion, midline shift, or mass effect. Ventricular prominence related to global parenchymal volume loss present without hydrocephalus.  Scalp soft tissues within normal limits. No acute abnormality about the orbits.  Calvarium intact. Paranasal sinuses and mastoid air cells are clear.  CT CERVICAL SPINE FINDINGS  Patient is status post ACDF at the C3 through C6 levels. Hardware appears  grossly intact. There appears to be complete fusion at these levels. There is 3 mm anterolisthesis of C2 on C3. Trace 2 mm anterolisthesis of C6 on C7. This is likely chronic. No fracture irregularity at the spinous process of C7 appears chronic in nature. Normal C1-2 articulations intact. Prevertebral soft tissues normal.  Multilevel degenerative disc disease and facet arthropathy present within the cervical spine, most prevalent about the C1-2 articulation.  No acute soft tissue abnormality. Visualized lungs are clear.  IMPRESSION: CT BRAIN:  1. No acute intracranial process. 2. Advanced age-related cerebral atrophy with chronic microvascular ischemic disease, stable from prior.  CT CERVICAL SPINE:  1. No acute traumatic injury within the cervical spine. 2. Status post ACDF at C3 through C6 without complication.   Electronically Signed By: Jeannine Boga M.D. On: 09/30/2014 01:36          DG Shoulder Right (Final result) Result time: 09/30/14 01:00:16   Final result by Rad Results In Interface (09/30/14 01:00:16)   Narrative:  CLINICAL DATA: Acute onset of right shoulder pain. Slid out of chair due to weakness. Initial encounter.  EXAM: RIGHT SHOULDER - 2+ VIEW  COMPARISON: Right shoulder radiographs performed 04/10/2014  FINDINGS: There is no evidence of fracture or dislocation. The right humeral head is seated within the glenoid fossa. Mild sclerotic change is noted at the right glenohumeral joint. Mild degenerative change is noted at the right acromioclavicular joint. No significant soft tissue abnormalities are seen. The visualized portions of the right lung are clear.  IMPRESSION: No evidence of fracture or dislocation. Mild chronic sclerotic change at the right glenohumeral joint.   Electronically Signed By: Garald Balding M.D. On: 09/30/2014 01:00         INITIAL IMPRESSION / ASSESSMENT AND PLAN / ED COURSE  Pertinent  labs & imaging results that were available during my care of the patient were reviewed by me and considered in my medical decision making (see chart for details).  Patient's troponin elevated at 0.04 without any ST segment elevation or depression. Patient will be admitted to the hospital for continued cardiac monitoring and evaluation. Patient discussed with Dr. Jannifer Franklin for hospital admission  ____________________________________________   FINAL CLINICAL IMPRESSION(S) / ED DIAGNOSES  Final diagnoses:  Elevated troponin      Gregor Hams, MD 09/30/14 [phone]

## 2014-09-30 NOTE — Progress Notes (Signed)
Pharmacy tech notified of need for PTA medication reconciliation.

## 2014-09-30 NOTE — Progress Notes (Signed)
PRE HD   09/30/14 1215  Report  Report Received From [facility], RN  Vital Signs  Temp 98.4 F (36.9 C)  Temp Source Oral  Pulse Rate 80  Pulse Rate Source Monitor  Resp 20  BP (!) 185/72 mmHg  BP Location Left Arm  BP Method Automatic  Patient Position (if appropriate) Lying  Oxygen Therapy  SpO2 97 %  O2 Device Room Air  Pain Assessment  Pain Assessment No/denies pain  Pain Score 0  Dialysis Weight  Weight 106.3 kg (234 lb 5.6 oz)  Type of Weight Pre-Dialysis  Time-Out for Hemodialysis  What Procedure? HEMODIALYSIS  Pt Identifiers(min of two) First/Last Name;MRN/Account#  Correct Site? Yes  Correct Side? Yes  Correct Procedure? Yes  Consents Verified? Yes  Rad Studies Available? N/A  Safety Precautions Reviewed? Yes  Engineer, civil (consulting) Number [phone]  Station Number 4  UF/Alarm Test Passed  Conductivity: Meter 13.9  Conductivity: Machine  14  pH 7.4  Reverse Osmosis MAIN  Dialyzer Lot Number [id number]  Disposable Set Lot Number [id number]  Machine Temperature 98.6 F (37 C)  Musician and Audible Yes  Blood Lines Intact and Secured Yes  Pre Treatment Patient Checks  Vascular access used during treatment Fistula  Hepatitis B Surface Antigen Results Negative  Date Hepatitis B Surface Antigen Drawn 09/20/14  Hemodialysis Consent Verified Yes  Hemodialysis Standing Orders Initiated Yes  ECG (Telemetry) Monitor On Yes  Prime Ordered Normal Saline  Length of  DialysisTreatment -hour(s) 3 Hour(s)  Dialysis Treatment Comments HD TREATMENT INITITATED.  PT ALERT AND VS STABLE.  15G NEEDLES INSERTED WITHOUT DIFFICULTY.   Dialyzer Optiflux 180 NR  Dialysate (3K2.5CA)  Dialysis Anticoagulant None  Dialysate Flow Ordered 600  Blood Flow Rate Ordered 400 mL/min  Ultrafiltration Goal 1.5 Liters  Dialysis Blood Pressure Support Ordered Normal Saline

## 2014-09-30 NOTE — ED Notes (Signed)
Pt provided urinal.

## 2014-09-30 NOTE — Progress Notes (Signed)
POST HD   09/30/14 1545  Vital Signs  Temp 98.2 F (36.8 C)  Temp Source Oral  Pulse Rate Source Monitor  Resp 20  BP Location Left Arm  BP Method Automatic  Patient Position (if appropriate) Lying  During Hemodialysis Assessment  Intra-Hemodialysis Comments HD TREATMENT END.  PT ALERT AND VS STABLE.  BLOOD RETURNED AND 15G NEEDLES REMOVED PER POLICY.    Post-Hemodialysis Assessment  Rinseback Volume (mL) 250 mL  Dialyzer Clearance Lightly streaked  Duration of HD Treatment -hour(s) 3 hour(s)  Hemodialysis Intake (mL) 500 mL  UF Total -Machine (mL) 2000 mL  Net UF (mL) 1500 mL  Tolerated HD Treatment Yes  Post-Hemodialysis Comments HD TREATMENT END.  PT ALERT AND VS STABLE.  BLOOD RETURNED AND 15G NEEDLES REMOVED PER POLICY.    AVG/AVF Arterial Site Held (minutes) 7 minutes  AVG/AVF Venous Site Held (minutes) 7 minutes  Education / Care Plan  Hemodialysis Education Provided Yes  Documented Education in Clinical Pathway Yes

## 2014-09-30 NOTE — ED Notes (Signed)
Patient returns from Xray/CT.

## 2014-09-30 NOTE — H&P (Signed)
[name] NAME: Troy Gallagher    MR#:  [id number]  DATE OF BIRTH:  July 02, 1945  DATE OF ADMISSION:  09/29/2014  PRIMARY CARE PHYSICIAN: Lelon Huh, MD   REQUESTING/REFERRING PHYSICIAN: Owens Shark, M.D.  CHIEF COMPLAINT:   Chief Complaint  Patient presents with  . Weakness    HISTORY OF PRESENT ILLNESS:  Troy Gallagher  is a 69 y.o. male who presents with multiple generalized complaints. Primarily the patient states that he has had significant generalized weakness "for some time now". Patient is only able to give a complaints on history, and is not able to fill in much detail in terms of duration of symptoms. He does state, however, that he feels like his weakness is directly related to his dialysis sessions. He especially feels that when his dialysis sessions go from there now for 4 hours he feels significantly weaker.  He denies any overt infectious symptomology, and denies any chest pain, shortness of breath, nausea or vomiting. He does complain of right shoulder pain, but is unable to further characterize pain. Upon evaluation in the ED he was found to have a mildly positive troponin 0.04. He complains of a mild nonproductive cough, diarrhea without blood, dysuria and hesitancy (states that he does still make some urine), but states that all the symptoms have been ongoing for some time now. Hospitalists were called for admission for his elevated troponin.  PAST MEDICAL HISTORY:   Past Medical History  Diagnosis Date  . Complication of anesthesia   . Hypertension   . Stroke   . GERD (gastroesophageal reflux disease)   . Headache   . Kidney dialysis status [id number]  . HLD (hyperlipidemia)   . Diabetes mellitus, type II   . Seizure   . BPH (benign prostatic hyperplasia)   . COPD (chronic obstructive pulmonary disease)   . PVD (peripheral vascular disease)   . Carotid stenosis   . Adenoma of large intestine   . ESRD on  hemodialysis     MWF  . Anxiety   . RLS (restless legs syndrome)     PAST SURGICAL HISTORY:   Past Surgical History  Procedure Laterality Date  . Shoulder arthroscopy w/ rotator cuff repair Left 1999  . Neck surgery N/A 10/24/2009    Cervical Diskectomy- C3-4, 4-5, 5-6 anterior disckectomy by Dr. Arnoldo Morale at [facility]  . Av fistula placement Left 2 years  . Colonoscopy N/A 06/14/2014    Procedure: COLONOSCOPY;  Surgeon: Hulen Luster, MD;  Location: [facility] ENDOSCOPY;  Service: Gastroenterology;  Laterality: N/A;  . Carotid endarterectomy Right 06/05/2013    Dr. Delana Meyer  . Tonsillectomy and adenoidectomy  1950  . 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 Repeat in 3 years  . Carotid doppler ultrasound  04/22/2013    85-90% stenosis of the RICA, 60-70% stenosis of left cmmon carotid and origin of left subclavian  . Doppler echocardiography  12/04/2011    Bilateral stenosis 80%  . Arch aortogram  04/07/2013    85-90% Stenosis origin of right ICA. 65-70% narrowing of left CCA. 50-60% narrowing of left subclavian artery    SOCIAL HISTORY:   Social History  Substance Use Topics  . Smoking status: Former Smoker -- 2.00 packs/day for 30 years    Types: Cigarettes    Quit date: 01/29/2002  . Smokeless tobacco: Current User  . Alcohol Use: No    FAMILY  HISTORY:   Family History  Problem Relation Age of Onset  . Stroke Mother   . Prostate cancer Father   . Heart disease Sister     DRUG ALLERGIES:   Allergies  Allergen Reactions  . Sulfur Hives and Rash    MEDICATIONS AT HOME:   Prior to Admission medications   Medication Sig Start Date End Date Taking? Authorizing Provider  acetaminophen (TYLENOL) 325 MG tablet Take 650 mg by mouth every 6 (six) hours as needed for mild pain or headache.    Historical Provider, MD  allopurinol (ZYLOPRIM) 100 MG tablet Take 100 mg by mouth daily.    Historical Provider, MD   ALPRAZolam Duanne Moron) 0.5 MG tablet Take 1 tablet (0.5 mg total) by mouth 2 (two) times daily. 08/31/14   Birdie Sons, MD  amLODipine (NORVASC) 10 MG tablet Take 10 mg by mouth daily.    Historical Provider, MD  amoxicillin-clavulanate (AUGMENTIN) 875-125 MG per tablet Take 1 tablet by mouth 2 (two) times daily. 09/28/14   Birdie Sons, MD  atorvastatin (LIPITOR) 40 MG tablet Take 40 mg by mouth daily at 6 PM.    Historical Provider, MD  azelastine (ASTELIN) 0.1 % nasal spray Place 2 sprays into both nostrils daily. Use in each nostril as directed    Historical Provider, MD  clarithromycin (BIAXIN) 500 MG tablet Take 1 tablet (500 mg total) by mouth 2 (two) times daily. 09/29/14   Jerrol Banana., MD  colchicine 0.6 MG tablet Take 0.6 mg by mouth every other day.    Historical Provider, MD  cycloSPORINE (RESTASIS) 0.05 % ophthalmic emulsion Place 1 drop into both eyes 2 (two) times daily.    Historical Provider, MD  diphenhydrAMINE (BENADRYL) 25 MG tablet Take 25 mg by mouth at bedtime as needed for itching or sleep.    Historical Provider, MD  esomeprazole (NEXIUM) 40 MG capsule Take 1 capsule (40 mg total) by mouth 2 (two) times daily. 09/06/14   Birdie Sons, MD  ferrous sulfate 325 (65 FE) MG tablet Take 325 mg by mouth daily with breakfast.    Historical Provider, MD  furosemide (LASIX) 40 MG tablet Take 40 mg by mouth 2 (two) times daily. At 8am and 1400    Historical Provider, MD  Ipratropium-Albuterol (COMBIVENT IN) Inhale 2 puffs into the lungs 3 (three) times daily as needed (SOB).    Historical Provider, MD  loratadine (CLARITIN) 10 MG tablet Take 10 mg by mouth every other day.    Historical Provider, MD  losartan (COZAAR) 100 MG tablet Take 100 mg by mouth daily.    Historical Provider, MD  meclizine (ANTIVERT) 25 MG tablet Take 25 mg by mouth 2 (two) times daily as needed for dizziness.    Historical Provider, MD  Multiple Vitamin (MULTIVITAMIN WITH MINERALS) TABS tablet Take 1  tablet by mouth daily.    Historical Provider, MD  POTASSIUM PO Take 1 tablet by mouth daily.    Historical Provider, MD  rOPINIRole (REQUIP) 3 MG tablet Take 3 mg by mouth at bedtime.    Historical Provider, MD    REVIEW OF SYSTEMS:  Review of Systems  Constitutional: Positive for malaise/fatigue. Negative for fever, chills and weight loss.  HENT: Negative for ear pain, hearing loss and tinnitus.   Eyes: Negative for blurred vision, double vision, pain and redness.  Respiratory: Positive for cough. Negative for hemoptysis and shortness of breath.   Cardiovascular: Negative for chest pain, palpitations, orthopnea and  leg swelling.  Gastrointestinal: Positive for diarrhea. Negative for nausea, vomiting, abdominal pain and constipation.  Genitourinary: Negative for dysuria, frequency and hematuria.  Musculoskeletal: Negative for back pain, joint pain and neck pain.  Skin:       No acne, rash, or lesions  Neurological: Positive for weakness. Negative for dizziness, tremors and focal weakness.  Endo/Heme/Allergies: Negative for polydipsia. Does not bruise/bleed easily.  Psychiatric/Behavioral: Negative for depression. The patient is not nervous/anxious and does not have insomnia.      VITAL SIGNS:   Filed Vitals:   09/30/14 0130 09/30/14 0200 09/30/14 0230 09/30/14 0300  BP: 162/62 151/68 176/75 153/78  Pulse: 72  77 73  Temp:      TempSrc:      Resp: 19 24 17 22   Height:      Weight:      SpO2: 94%  96% 95%   Wt Readings from Last 3 Encounters:  09/29/14 108.863 kg (240 lb)  04/23/14 105.688 kg (233 lb)  06/14/14 108.863 kg (240 lb)    PHYSICAL EXAMINATION:  Physical Exam  Vitals reviewed. Constitutional: He is oriented to person, place, and time. He appears well-developed and well-nourished. No distress.  HENT:  Head: Normocephalic and atraumatic.  Mouth/Throat: Oropharynx is clear and moist.  Eyes: Conjunctivae and EOM are normal. Pupils are equal, round, and reactive  to light. No scleral icterus.  Neck: Normal range of motion. Neck supple. No JVD present. No thyromegaly present.  Cardiovascular: Normal rate, regular rhythm and intact distal pulses.  Exam reveals no gallop and no friction rub.   No murmur heard. Respiratory: Effort normal and breath sounds normal. No respiratory distress. He has no wheezes. He has no rales.  GI: Soft. Bowel sounds are normal. He exhibits no distension. There is no tenderness.  Musculoskeletal: Normal range of motion. He exhibits no edema.  No arthritis, no gout  Lymphadenopathy:    He has no cervical adenopathy.  Neurological: He is alert and oriented to person, place, and time. No cranial nerve deficit.  No dysarthria, no aphasia, strength is equal bilaterally in upper or lower extremities and 5 out of 5  Skin: Skin is warm and dry. No rash noted. No erythema.  Psychiatric: His behavior is normal. Judgment and thought content normal.  Mood/affect seems slowed.    LABORATORY PANEL:   CBC No results for input(s): WBC, HGB, HCT, PLT in the last 168 hours. ------------------------------------------------------------------------------------------------------------------  Chemistries   Recent Labs Lab 09/30/14 0005  NA 139  K 3.9  CL 101  CO2 26  GLUCOSE 102*  BUN 64*  CREATININE 8.38*  CALCIUM 9.4  AST 18  ALT 12*  ALKPHOS 66  BILITOT 0.3   ------------------------------------------------------------------------------------------------------------------  Cardiac Enzymes  Recent Labs Lab 09/30/14 0005  TROPONINI 0.04*   ------------------------------------------------------------------------------------------------------------------  RADIOLOGY:  Dg Chest 2 View  09/28/2014   CLINICAL DATA:  Productive cough and fever.  Asthma.  EXAM: CHEST  2 VIEW  COMPARISON:  04/13/2014  FINDINGS: Mild cardiomegaly remains stable. No evidence of pulmonary infiltrate or edema. No evidence of pleural effusion.  Cervical spine fusion hardware again noted.  IMPRESSION: Stable mild cardiomegaly.  No active lung disease.   Electronically Signed   By: Earle Gell M.D.   On: 09/28/2014 16:44   Dg Shoulder Right  09/30/2014   CLINICAL DATA:  Acute onset of right shoulder pain. Slid out of chair due to weakness. Initial encounter.  EXAM: RIGHT SHOULDER - 2+ VIEW  COMPARISON:  Right  shoulder radiographs performed 04/10/2014  FINDINGS: There is no evidence of fracture or dislocation. The right humeral head is seated within the glenoid fossa. Mild sclerotic change is noted at the right glenohumeral joint. Mild degenerative change is noted at the right acromioclavicular joint. No significant soft tissue abnormalities are seen. The visualized portions of the right lung are clear.  IMPRESSION: No evidence of fracture or dislocation. Mild chronic sclerotic change at the right glenohumeral joint.   Electronically Signed   By: Garald Balding M.D.   On: 09/30/2014 01:00   Ct Head Wo Contrast  09/30/2014   CLINICAL DATA:  Initial evaluation for acute trauma, fall.  EXAM: CT HEAD WITHOUT CONTRAST  CT CERVICAL SPINE WITHOUT CONTRAST  TECHNIQUE: Multidetector CT imaging of the head and cervical spine was performed following the standard protocol without intravenous contrast. Multiplanar CT image reconstructions of the cervical spine were also generated.  COMPARISON:  Prior study from 04/10/2014.  FINDINGS: CT HEAD FINDINGS  Generalized cerebral atrophy with chronic microvascular ischemic disease present, stable from previous. Vascular calcifications noted within the carotid siphons and distal left MCA, stable. 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  No acute large vessel territory infarct. No intracranial hemorrhage. No mass lesion, midline shift, or mass effect. Ventricular prominence related to global parenchymal volume loss present without hydrocephalus.  Scalp soft tissues within normal limits. No acute abnormality about  the orbits.  Calvarium intact. Paranasal sinuses and mastoid air cells are clear.  CT CERVICAL SPINE FINDINGS  Patient is status post ACDF at the C3 through C6 levels. Hardware appears grossly intact. There appears to be complete fusion at these levels. There is 3 mm anterolisthesis of C2 on C3. Trace 2 mm anterolisthesis of C6 on C7. This is likely chronic. No fracture irregularity at the spinous process of C7 appears chronic in nature. Normal C1-2 articulations intact. Prevertebral soft tissues normal.  Multilevel degenerative disc disease and facet arthropathy present within the cervical spine, most prevalent about the C1-2 articulation.  No acute soft tissue abnormality.  Visualized lungs are clear.  IMPRESSION: CT BRAIN:  1. No acute intracranial process. 2. Advanced age-related cerebral atrophy with chronic microvascular ischemic disease, stable from prior.  CT CERVICAL SPINE:  1. No acute traumatic injury within the cervical spine. 2. Status post ACDF at C3 through C6 without complication.   Electronically Signed   By: Jeannine Boga M.D.   On: 09/30/2014 01:36   Ct Cervical Spine Wo Contrast  09/30/2014   CLINICAL DATA:  Initial evaluation for acute trauma, fall.  EXAM: CT HEAD WITHOUT CONTRAST  CT CERVICAL SPINE WITHOUT CONTRAST  TECHNIQUE: Multidetector CT imaging of the head and cervical spine was performed following the standard protocol without intravenous contrast. Multiplanar CT image reconstructions of the cervical spine were also generated.  COMPARISON:  Prior study from 04/10/2014.  FINDINGS: CT HEAD FINDINGS  Generalized cerebral atrophy with chronic microvascular ischemic disease present, stable from previous. Vascular calcifications noted within the carotid siphons and distal left MCA, stable. 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  No acute large vessel territory infarct. No intracranial hemorrhage. No mass lesion, midline shift, or mass effect. Ventricular prominence  related to global parenchymal volume loss present without hydrocephalus.  Scalp soft tissues within normal limits. No acute abnormality about the orbits.  Calvarium intact. Paranasal sinuses and mastoid air cells are clear.  CT CERVICAL SPINE FINDINGS  Patient is status post ACDF at the C3 through C6 levels. Hardware appears grossly intact.  There appears to be complete fusion at these levels. There is 3 mm anterolisthesis of C2 on C3. Trace 2 mm anterolisthesis of C6 on C7. This is likely chronic. No fracture irregularity at the spinous process of C7 appears chronic in nature. Normal C1-2 articulations intact. Prevertebral soft tissues normal.  Multilevel degenerative disc disease and facet arthropathy present within the cervical spine, most prevalent about the C1-2 articulation.  No acute soft tissue abnormality.  Visualized lungs are clear.  IMPRESSION: CT BRAIN:  1. No acute intracranial process. 2. Advanced age-related cerebral atrophy with chronic microvascular ischemic disease, stable from prior.  CT CERVICAL SPINE:  1. No acute traumatic injury within the cervical spine. 2. Status post ACDF at C3 through C6 without complication.   Electronically Signed   By: Jeannine Boga M.D.   On: 09/30/2014 01:36    EKG:   Orders placed or performed during the hospital encounter of 09/29/14  . EKG 12-Lead  . EKG 12-Lead    IMPRESSION AND PLAN:  Principal Problem:   Elevated troponin - without typical ACS symptoms, chest x-ray clear, first set of enzymes barely elevated at 0.04. This may likely be seen in setting of end-stage renal disease. We'll trend his cardiac enzymes to start with, and then make further decisions based on his clinical picture at that point. Active Problems:   Weakness generalized - likely related to his dialysis sessions, unclear etiology at this time, we'll defer to nephrology's recommendations   ESRD on hemodialysis - normally Monday Wednesday Friday dialysis patient, missed his  dialysis yesterday, nephrology consult for dialysis support   HTN (hypertension) - elevated here, we'll order antihypertensives to keep his blood pressure less than 160/100   Type 2 diabetes mellitus - history of the same, last hemoglobin A1c was 5.3, not on any anti-glycemic, we will monitor his glucose and if it becomes elevated we can start sliding scale insulin, we've ordered a carb modified diet for now   Anxiety - continue home meds for this   GERD (gastroesophageal reflux disease) - equivalent Home dose PPI   HLD (hyperlipidemia) - continue home meds   RLS (restless legs syndrome) - continue home meds  All the records are reviewed and case discussed with ED provider. Management plans discussed with the patient and/or family.  DVT PROPHYLAXIS: SubQ heparin  ADMISSION STATUS: Observation  CODE STATUS: Full  TOTAL TIME TAKING CARE OF THIS PATIENT: 45 minutes.    Regina Coppolino [place] 09/30/2014, 3:28 AM  [facility]  Office  [phone]  CC: Primary care physician; Lelon Huh, MD

## 2014-09-30 NOTE — Care Management Note (Signed)
Patient is active at [facility]  MWF 1st shift.  I notified clinic of observation.  I will update them with additional records at discharge. Met with patient while he was in the dialysis center. Records show patient missed dialysis on Wednesday. [facility] Liaison  [phone]

## 2014-09-30 NOTE — ED Notes (Signed)
Patient transported to X-ray 

## 2014-09-30 NOTE — Progress Notes (Signed)
Troy Gallagher wallet was lost. Troy Gallagher was found in MRI. Wallet had $3, 1 [employer] card, [employer] card,driver license card, several ID cards. Pt consented that he wanted daughter [name] to have the wallet. Written consent was given by patient and placed in chart. Wallet was given to [employer].

## 2014-09-30 NOTE — Care Management (Signed)
Patient followed by [facility].  He missed his session on Wed because of weakness.  Atempt to speak with patient unsuccessful as he was off the unit.  Notified   Dialysis coordinator of admission.

## 2014-09-30 NOTE — Progress Notes (Signed)
[facility] [place] NAME: Troy Gallagher    MR#:  [id number]  DATE OF BIRTH:  03/29/1945  SUBJECTIVE:  CHIEF COMPLAINT:   Chief Complaint  Patient presents with  . Weakness   -Very nonspecific complaints. Admitted for weakness. Troponin barely positive. -Patient complains of significant right shoulder pain and decreased range of motion going on for a few days now. Denies any trauma -Missed dialysis yesterday. - Not a great historian.   REVIEW OF SYSTEMS:  Review of Systems  Constitutional: Negative for fever and chills.  HENT: Negative for ear discharge and ear pain.   Eyes: Negative for blurred vision.  Respiratory: Negative for cough, shortness of breath and wheezing.   Cardiovascular: Negative for chest pain and palpitations.  Gastrointestinal: Negative for heartburn, nausea, vomiting, abdominal pain, diarrhea and constipation.  Genitourinary: Negative for dysuria.  Musculoskeletal: Positive for myalgias, back pain and joint pain.  Neurological: Positive for weakness. Negative for dizziness, sensory change, speech change, seizures and headaches.    DRUG ALLERGIES:   Allergies  Allergen Reactions  . Sulfur Hives and Rash    VITALS:  Blood pressure 179/114, pulse 82, temperature 98.4 F (36.9 C), temperature source Oral, resp. rate 19, height 5\' 9"  (1.753 m), weight 106.3 kg (234 lb 5.6 oz), SpO2 97 %.  PHYSICAL EXAMINATION:  Physical Exam  GENERAL:  69 y.o.-year-old patient lying in the bed with no acute distress.  EYES: Pupils equal, round, reactive to light and accommodation. No scleral icterus. Extraocular muscles intact.  HEENT: Head atraumatic, normocephalic. Oropharynx and nasopharynx clear.  NECK:  Supple, no jugular venous distention. No thyroid enlargement, no tenderness.  LUNGS: Normal breath sounds bilaterally, no wheezing, rales,rhonchi or crepitation. No use of accessory muscles of respiration. Decreased  bibasilar breath sounds. CARDIOVASCULAR: S1, S2 normal. No rubs, or gallops. 3/6 systolic murmur. ABDOMEN: Soft, nontender, nondistended. Bowel sounds present. No organomegaly or mass.  EXTREMITIES: No pedal edema, cyanosis, or clubbing.  Significant pain of the right shoulder on movement especially in the posterior part. Adduction and internal rotation are fine especially abduction and flexion, extension are limited NEUROLOGIC: Cranial nerves II through XII are intact. Muscle strength 5/5 in all extremities except the right upper extremity, limited secondary to pain.. Sensation intact. Gait not checked.  PSYCHIATRIC: The patient is alert and oriented x 3.  SKIN: No obvious rash, lesion, or ulcer.    LABORATORY PANEL:   CBC  Recent Labs Lab 09/30/14 0516  WBC 8.8  HGB 10.1*  HCT 30.3*  PLT 319   ------------------------------------------------------------------------------------------------------------------  Chemistries   Recent Labs Lab 09/30/14 0005 09/30/14 0516  NA 139 139  K 3.9 3.6  CL 101 102  CO2 26 23  GLUCOSE 102* 92  BUN 64* 67*  CREATININE 8.38* 8.57*  8.61*  CALCIUM 9.4 9.3  MG  --  1.9  AST 18  --   ALT 12*  --   ALKPHOS 66  --   BILITOT 0.3  --    ------------------------------------------------------------------------------------------------------------------  Cardiac Enzymes  Recent Labs Lab 09/30/14 1110  TROPONINI 0.03   ------------------------------------------------------------------------------------------------------------------  RADIOLOGY:  Dg Chest 2 View  09/28/2014   CLINICAL DATA:  Productive cough and fever.  Asthma.  EXAM: CHEST  2 VIEW  COMPARISON:  04/13/2014  FINDINGS: Mild cardiomegaly remains stable. No evidence of pulmonary infiltrate or edema. No evidence of pleural effusion. Cervical spine fusion hardware again noted.  IMPRESSION: Stable mild cardiomegaly.  No active lung disease.  Electronically Signed   By: Earle Gell M.D.   On: 09/28/2014 16:44   Dg Shoulder Right  09/30/2014   CLINICAL DATA:  Acute onset of right shoulder pain. Slid out of chair due to weakness. Initial encounter.  EXAM: RIGHT SHOULDER - 2+ VIEW  COMPARISON:  Right shoulder radiographs performed 04/10/2014  FINDINGS: There is no evidence of fracture or dislocation. The right humeral head is seated within the glenoid fossa. Mild sclerotic change is noted at the right glenohumeral joint. Mild degenerative change is noted at the right acromioclavicular joint. No significant soft tissue abnormalities are seen. The visualized portions of the right lung are clear.  IMPRESSION: No evidence of fracture or dislocation. Mild chronic sclerotic change at the right glenohumeral joint.   Electronically Signed   By: Garald Balding M.D.   On: 09/30/2014 01:00   Ct Head Wo Contrast  09/30/2014   CLINICAL DATA:  Initial evaluation for acute trauma, fall.  EXAM: CT HEAD WITHOUT CONTRAST  CT CERVICAL SPINE WITHOUT CONTRAST  TECHNIQUE: Multidetector CT imaging of the head and cervical spine was performed following the standard protocol without intravenous contrast. Multiplanar CT image reconstructions of the cervical spine were also generated.  COMPARISON:  Prior study from 04/10/2014.  FINDINGS: CT HEAD FINDINGS  Generalized cerebral atrophy with chronic microvascular ischemic disease present, stable from previous. Vascular calcifications noted within the carotid siphons and distal left MCA, stable. 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  No acute large vessel territory infarct. No intracranial hemorrhage. No mass lesion, midline shift, or mass effect. Ventricular prominence related to global parenchymal volume loss present without hydrocephalus.  Scalp soft tissues within normal limits. No acute abnormality about the orbits.  Calvarium intact. Paranasal sinuses and mastoid air cells are clear.  CT CERVICAL SPINE FINDINGS  Patient is status post ACDF at the  C3 through C6 levels. Hardware appears grossly intact. There appears to be complete fusion at these levels. There is 3 mm anterolisthesis of C2 on C3. Trace 2 mm anterolisthesis of C6 on C7. This is likely chronic. No fracture irregularity at the spinous process of C7 appears chronic in nature. Normal C1-2 articulations intact. Prevertebral soft tissues normal.  Multilevel degenerative disc disease and facet arthropathy present within the cervical spine, most prevalent about the C1-2 articulation.  No acute soft tissue abnormality.  Visualized lungs are clear.  IMPRESSION: CT BRAIN:  1. No acute intracranial process. 2. Advanced age-related cerebral atrophy with chronic microvascular ischemic disease, stable from prior.  CT CERVICAL SPINE:  1. No acute traumatic injury within the cervical spine. 2. Status post ACDF at C3 through C6 without complication.   Electronically Signed   By: Jeannine Boga M.D.   On: 09/30/2014 01:36   Ct Cervical Spine Wo Contrast  09/30/2014   CLINICAL DATA:  Initial evaluation for acute trauma, fall.  EXAM: CT HEAD WITHOUT CONTRAST  CT CERVICAL SPINE WITHOUT CONTRAST  TECHNIQUE: Multidetector CT imaging of the head and cervical spine was performed following the standard protocol without intravenous contrast. Multiplanar CT image reconstructions of the cervical spine were also generated.  COMPARISON:  Prior study from 04/10/2014.  FINDINGS: CT HEAD FINDINGS  Generalized cerebral atrophy with chronic microvascular ischemic disease present, stable from previous. Vascular calcifications noted within the carotid siphons and distal left MCA, stable. 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  No acute large vessel territory infarct. No intracranial hemorrhage. No mass lesion, midline shift, or mass effect. Ventricular prominence related to  global parenchymal volume loss present without hydrocephalus.  Scalp soft tissues within normal limits. No acute abnormality about the  orbits.  Calvarium intact. Paranasal sinuses and mastoid air cells are clear.  CT CERVICAL SPINE FINDINGS  Patient is status post ACDF at the C3 through C6 levels. Hardware appears grossly intact. There appears to be complete fusion at these levels. There is 3 mm anterolisthesis of C2 on C3. Trace 2 mm anterolisthesis of C6 on C7. This is likely chronic. No fracture irregularity at the spinous process of C7 appears chronic in nature. Normal C1-2 articulations intact. Prevertebral soft tissues normal.  Multilevel degenerative disc disease and facet arthropathy present within the cervical spine, most prevalent about the C1-2 articulation.  No acute soft tissue abnormality.  Visualized lungs are clear.  IMPRESSION: CT BRAIN:  1. No acute intracranial process. 2. Advanced age-related cerebral atrophy with chronic microvascular ischemic disease, stable from prior.  CT CERVICAL SPINE:  1. No acute traumatic injury within the cervical spine. 2. Status post ACDF at C3 through C6 without complication.   Electronically Signed   By: Jeannine Boga M.D.   On: 09/30/2014 01:36    EKG:   Orders placed or performed during the hospital encounter of 09/29/14  . EKG 12-Lead  . EKG 12-Lead  . EKG 12-Lead  . EKG 12-Lead    ASSESSMENT AND PLAN:   69 year old male with past medical history significant for end-stage renal disease on Monday Wednesday Friday hemodialysis, hypertension, GERD, history of CVA, diabetes presents to the hospital with generalized weakness, missing hemodialysis.  #1 generalized weakness-no infection noted. -Physical therapy consult. -Generalized deconditioning, currently wheelchair-bound. Lives at home with his daughter.  #2 right shoulder pain and limited range of motion-denies any trauma. -Going on for a few days now. Possibly rotator cuff involvement -MRI of right shoulder. -X-rays do not show any acute dislocation or fractures.  #3 Elevated troponin- no chest pain, likely from  his ESRD - recycle  #4 ESRD on HD- Mon-wed--Fri schedule, missed yesterday - Nephrology consult, for dialysis today  #5 HTN- elevated BP, on norvasc and losartan - added PO hydralazine - on hydralazine and labetelol prn  #6 DVT prophylaxis-subcutaneous heparin.  All the records are reviewed and case discussed with Care Management/Social Workerr. Management plans discussed with the patient, family and they are in agreement.  CODE STATUS: Full code  TOTAL TIME TAKING CARE OF THIS PATIENT: 36 minutes.   POSSIBLE D/C IN 1 day, DEPENDING ON CLINICAL CONDITION.   Gladstone Lighter M.D on 09/30/2014 at 2:51 PM  Between 7am to 6pm - Pager - [phone]  After 6pm go to [email or web address] - password [email or web address] [facility]  Office  [phone]  CC: Primary care physician; Lelon Huh, MD

## 2014-10-01 DIAGNOSIS — M75101 Unspecified rotator cuff tear or rupture of right shoulder, not specified as traumatic: Secondary | ICD-10-CM | POA: Diagnosis present

## 2014-10-01 DIAGNOSIS — Z8249 Family history of ischemic heart disease and other diseases of the circulatory system: Secondary | ICD-10-CM | POA: Diagnosis not present

## 2014-10-01 DIAGNOSIS — I120 Hypertensive chronic kidney disease with stage 5 chronic kidney disease or end stage renal disease: Secondary | ICD-10-CM | POA: Diagnosis present

## 2014-10-01 DIAGNOSIS — G2581 Restless legs syndrome: Secondary | ICD-10-CM | POA: Diagnosis present

## 2014-10-01 DIAGNOSIS — D649 Anemia, unspecified: Secondary | ICD-10-CM | POA: Diagnosis present

## 2014-10-01 DIAGNOSIS — J449 Chronic obstructive pulmonary disease, unspecified: Secondary | ICD-10-CM | POA: Diagnosis present

## 2014-10-01 DIAGNOSIS — F419 Anxiety disorder, unspecified: Secondary | ICD-10-CM | POA: Diagnosis present

## 2014-10-01 DIAGNOSIS — Z823 Family history of stroke: Secondary | ICD-10-CM | POA: Diagnosis not present

## 2014-10-01 DIAGNOSIS — N400 Enlarged prostate without lower urinary tract symptoms: Secondary | ICD-10-CM | POA: Diagnosis present

## 2014-10-01 DIAGNOSIS — R531 Weakness: Secondary | ICD-10-CM

## 2014-10-01 DIAGNOSIS — R0789 Other chest pain: Secondary | ICD-10-CM | POA: Diagnosis present

## 2014-10-01 DIAGNOSIS — E785 Hyperlipidemia, unspecified: Secondary | ICD-10-CM | POA: Diagnosis present

## 2014-10-01 DIAGNOSIS — Z87891 Personal history of nicotine dependence: Secondary | ICD-10-CM | POA: Diagnosis not present

## 2014-10-01 DIAGNOSIS — Z8042 Family history of malignant neoplasm of prostate: Secondary | ICD-10-CM | POA: Diagnosis not present

## 2014-10-01 DIAGNOSIS — Z992 Dependence on renal dialysis: Secondary | ICD-10-CM | POA: Diagnosis not present

## 2014-10-01 DIAGNOSIS — M109 Gout, unspecified: Secondary | ICD-10-CM | POA: Diagnosis present

## 2014-10-01 DIAGNOSIS — E1122 Type 2 diabetes mellitus with diabetic chronic kidney disease: Secondary | ICD-10-CM | POA: Diagnosis present

## 2014-10-01 DIAGNOSIS — Z8673 Personal history of transient ischemic attack (TIA), and cerebral infarction without residual deficits: Secondary | ICD-10-CM | POA: Diagnosis not present

## 2014-10-01 DIAGNOSIS — Z993 Dependence on wheelchair: Secondary | ICD-10-CM | POA: Diagnosis not present

## 2014-10-01 DIAGNOSIS — I739 Peripheral vascular disease, unspecified: Secondary | ICD-10-CM | POA: Diagnosis present

## 2014-10-01 DIAGNOSIS — K219 Gastro-esophageal reflux disease without esophagitis: Secondary | ICD-10-CM | POA: Diagnosis present

## 2014-10-01 DIAGNOSIS — N186 End stage renal disease: Secondary | ICD-10-CM | POA: Diagnosis present

## 2014-10-01 DIAGNOSIS — N2581 Secondary hyperparathyroidism of renal origin: Secondary | ICD-10-CM | POA: Diagnosis present

## 2014-10-01 DIAGNOSIS — R748 Abnormal levels of other serum enzymes: Secondary | ICD-10-CM | POA: Diagnosis present

## 2014-10-01 LAB — PARATHYROID HORMONE, INTACT (NO CA): PTH: 449 pg/mL — ABNORMAL HIGH (ref 15–65)

## 2014-10-01 MED ORDER — EPOETIN ALFA 10000 UNIT/ML IJ SOLN: 10000.0000 [IU] | INTRAMUSCULAR | Status: DC

## 2014-10-01 MED ORDER — SODIUM CHLORIDE 0.9 % IJ SOLN: 3.0000 mL | INTRAMUSCULAR | Status: DC | PRN

## 2014-10-01 NOTE — Progress Notes (Signed)
Post HD   10/01/14 1744  Neurological  Level of Consciousness Alert  Orientation Level Oriented X4  Respiratory  Respiratory Pattern Regular;Unlabored  Chest Assessment Chest expansion symmetrical  Bilateral Breath Sounds Diminished  Cardiac  Pulse Regular  ECG Monitor Yes  Cardiac Rhythm NSR  Vascular  Edema Generalized  Generalized Edema Other (Comment) (non pitting)  RLE Edema Non-pitting  LLE Edema Non-Pitting  Integumentary  Integumentary (WDL) WDL  Musculoskeletal  Musculoskeletal (WDL) WDL  Gastrointestinal  Bowel Sounds Assessment Active  GU Assessment  Genitourinary (WDL) X  Genitourinary Symptoms Other (Comment) (HD, cath fell off)  Psychosocial  Psychosocial (WDL) WDL

## 2014-10-01 NOTE — Progress Notes (Signed)
PTA med rec not complete. Pharmacy tech notified.

## 2014-10-01 NOTE — Evaluation (Signed)
Physical Therapy Evaluation Patient Details Name: Troy Gallagher MRN: [id number] DOB: February 10, 1945 Today's Date: 10/01/2014   History of Present Illness  Pt with gradual weakness, increased falls and worsening R shoulder pain.    Clinical Impression  Pt shows general weakness, has had increased R shoulder pain recently (imaging shows chronic RTC rupture) and with increased falls seems to have aggravated this.  He has has numerous and frequent falls where EMS has needed to be called.  Pt does poorly with PT today and is unable to get to full standing despite heavy cuing and assist and ultimately shows poor overall mobility and clearly would not be safe at home.     Follow Up Recommendations SNF (pt would no be safe returning to home at this time)    Equipment Recommendations       Recommendations for Other Services       Precautions / Restrictions Precautions Precautions: Fall Precaution Comments: Pt/daughter report that recently he has been having more and more falls and has needed to call EMS ~1x/week to help him up from the floor Restrictions Weight Bearing Restrictions: No      Mobility  Bed Mobility               General bed mobility comments: pt was in recliner on exam, reports he needed signficant assist to get sitting at EOB  Transfers Overall transfer level: Needs assistance Equipment used: Rolling walker (2 wheeled) Transfers: Sit to/from Stand Sit to Stand: Total assist         General transfer comment: Despite heavy assist and much encouragement from PT and daughter pt was not able to get to full standing and though he shows effort he is uanble to straighten up or even get knees all the way straight despite very heavy assist  Ambulation/Gait Ambulation/Gait assistance:  (unable)              Stairs            Wheelchair Mobility    Modified Rankin (Stroke Patients Only)       Balance                                              Pertinent Vitals/Pain Pain Assessment:  (general pain, especially shoulders (R>L))    Home Living Family/patient expects to be discharged to:: Skilled nursing facility (pt/family unsure of where he wants/should go) Living Arrangements: Children                    Prior Function Level of Independence: Needs assistance   Gait / Transfers Assistance Needed: pt rarely walks, mostly in w/c      Comments: Pt is able to do very little around the home and has been doing even less     Hand Dominance        Extremity/Trunk Assessment   Upper Extremity Assessment: Generalized weakness (b/l shoulder elevation very limited, essentially none on R)           Lower Extremity Assessment: Generalized weakness (LEs are weak, but generally appear functional with testing)         Communication   Communication: No difficulties  Cognition Arousal/Alertness: Awake/alert Behavior During Therapy: Restless Overall Cognitive Status: Within Functional Limits for tasks assessed  General Comments      Exercises        Assessment/Plan    PT Assessment Patient needs continued PT services  PT Diagnosis Difficulty walking;Generalized weakness   PT Problem List Decreased strength;Decreased activity tolerance;Decreased balance;Decreased mobility;Decreased safety awareness;Decreased knowledge of use of DME;Pain  PT Treatment Interventions Gait training;Functional mobility training;Therapeutic activities;Therapeutic exercise;Neuromuscular re-education;Balance training;Wheelchair mobility training;DME instruction   PT Goals (Current goals can be found in the Care Plan section) Acute Rehab PT Goals Patient Stated Goal: pt wants to go home PT Goal Formulation: With patient/family Time For Goal Achievement: 10/15/14 Potential to Achieve Goals: Fair    Frequency Min 2X/week   Barriers to discharge        Co-evaluation               End  of Session Equipment Utilized During Treatment: Gait belt Activity Tolerance: Patient limited by fatigue;Patient limited by pain Patient left: with chair alarm set      Functional Assessment Tool Used:  (clinical judgement) Functional Limitation: Mobility: Walking and moving around Mobility: Walking and Moving Around Current Status (L5449): At least 80 percent but less than 100 percent impaired, limited or restricted Mobility: Walking and Moving Around Goal Status [phone]): At least 60 percent but less than 80 percent impaired, limited or restricted    Time: 1100-1122 PT Time Calculation (min) (ACUTE ONLY): 22 min   Charges:   PT Evaluation $Initial PT Evaluation Tier I: 1 Procedure     PT G Codes:   PT G-Codes **NOT FOR INPATIENT CLASS** Functional Assessment Tool Used:  (clinical judgement) Functional Limitation: Mobility: Walking and moving around Mobility: Walking and Moving Around Current Status (F1219): At least 80 percent but less than 100 percent impaired, limited or restricted Mobility: Walking and Moving Around Goal Status [phone]): At least 60 percent but less than 80 percent impaired, limited or restricted   Troy Gallagher, PT, DPT [phone]  Troy Gallagher 10/01/2014, 3:04 PM

## 2014-10-01 NOTE — Progress Notes (Signed)
POST HD   10/01/14 1745  Vital Signs  Temp 97.5 F (36.4 C)  Pulse Rate 88  Resp (!) 24  BP (!) 157/61 mmHg  Dialysis Weight  Weight 104.6 kg (230 lb 9.6 oz)  Type of Weight Post-Dialysis  During Hemodialysis Assessment  Intra-Hemodialysis Comments hd treatmenty end. goal met.  Blood returned and 15g needles removed per policy.  Pt alert and VS stable.   Post-Hemodialysis Assessment  Rinseback Volume (mL) 250 mL  Dialyzer Clearance Lightly streaked  Duration of HD Treatment -hour(s) 3 hour(s)  Hemodialysis Intake (mL) 500 mL  UF Total -Machine (mL) 1500 mL  Net UF (mL) 1000 mL  Tolerated HD Treatment Yes  Post-Hemodialysis Comments hd treatmenty end. goal met.  Blood returned and 15g needles removed per policy.  Pt alert and VS stable.   AVG/AVF Arterial Site Held (minutes) 7 minutes  AVG/AVF Venous Site Held (minutes) 7 minutes  Education / Care Plan  Hemodialysis Education Provided Yes  Documented Education in Clinical Pathway Yes  Fistula / Graft Left Upper arm Arteriovenous vein graft  No Placement Date or Time found.   Placed prior to admission: Yes  Orientation: Left  Access Location: Upper arm  Access Type: Arteriovenous vein graft  Site Condition No complications  Fistula / Graft Assessment Present;Bruit;Thrill  Status Deaccessed  Needle Size 15  Drainage Description None

## 2014-10-01 NOTE — Progress Notes (Signed)
Paged Dr. Ether Griffins with MR shoulder results. MD acknowledged and will have orthopedic surgery see patient. Dr. Roland Rack is on call today.

## 2014-10-01 NOTE — Progress Notes (Signed)
[facility]  ROUNDING NOTE   Subjective:  Pt still feeling weak. Due for HD today per usual schedule. Seems a bit more awake and alert today. Objective:  Vital signs in last 24 hours:  Temp:  [98.2 F (36.8 C)-100.1 F (37.8 C)] 99.5 F (37.5 C) (09/02 1137) Pulse Rate:  [79-87] 79 (09/02 1137) Resp:  [17-29] 18 (09/02 1137) BP: (142-194)/(55-114) 160/72 mmHg (09/02 1137) SpO2:  [95 %-99 %] 99 % (09/02 1137) Weight:  [103.42 kg (228 lb)-106.142 kg (234 lb)] 103.42 kg (228 lb) (09/01 1722)  Weight change: -2.563 kg (-5 lb 10.4 oz) Filed Weights   09/30/14 0457 09/30/14 1215 09/30/14 1722  Weight: 105.824 kg (233 lb 4.8 oz) 106.3 kg (234 lb 5.6 oz) 103.42 kg (228 lb)    Intake/Output: I/O last 3 completed shifts: In: 480 [P.O.:480] Out: 2450 [Urine:950; Other:1500]   Intake/Output this shift:  Total I/O In: 240 [P.O.:240] Out: 0   Physical Exam: General: NAD, resting in bed  Head: Normocephalic, atraumatic. Moist oral mucosal membranes  Eyes: Anicteric, PERRL  Neck: Supple, trachea midline  Lungs:  Clear to auscultation normal effort  Heart: Regular rate and rhythm  Abdomen:  Soft, nontender, BS present  Extremities:  trace peripheral edema.  Neurologic: Nonfocal, moving all four extremities  Skin: No lesions  Access: LUE AVF    Basic Metabolic Panel:  Recent Labs Lab 09/30/14 0005 09/30/14 0516 09/30/14 1633  NA 139 139 138  K 3.9 3.6 4.1  CL 101 102 101  CO2 26 23 21*  GLUCOSE 102* 92 71  BUN 64* 67* 65*  CREATININE 8.38* 8.57*  8.61* 8.56*  CALCIUM 9.4 9.3 9.6  MG  --  1.9  --   PHOS  --  6.5* 5.5*    Liver Function Tests:  Recent Labs Lab 09/30/14 0005 09/30/14 1633  AST 18  --   ALT 12*  --   ALKPHOS 66  --   BILITOT 0.3  --   PROT 7.6  --   ALBUMIN 3.4* 3.1*   No results for input(s): LIPASE, AMYLASE in the last 168 hours. No results for input(s): AMMONIA in the last 168 hours.  CBC:  Recent Labs Lab  09/30/14 0516 09/30/14 1633  WBC 8.8 8.8  HGB 10.1* 9.5*  HCT 30.3* 29.6*  MCV 94.9 95.0  PLT 319 316    Cardiac Enzymes:  Recent Labs Lab 09/30/14 0005 09/30/14 0516 09/30/14 1110  TROPONINI 0.04* 0.04* 0.03    BNP: Invalid input(s): POCBNP  CBG: No results for input(s): GLUCAP in the last 168 hours.  Microbiology: Results for orders placed or performed during the hospital encounter of 10/24/09  Surgical pcr screen     Status: None   Collection Time: 10/19/09  2:01 PM  Result Value Ref Range Status   MRSA, PCR NEGATIVE NEGATIVE Final   Staphylococcus aureus  NEGATIVE Final    NEGATIVE        The Xpert SA Assay (FDA approved for NASAL specimens only), is one component of a comprehensive surveillance program.  It is not intended to diagnose infection nor to guide or monitor treatment.    Coagulation Studies:  Recent Labs  09/30/14 0005  LABPROT 15.0  INR 1.16    Urinalysis: No results for input(s): COLORURINE, LABSPEC, PHURINE, GLUCOSEU, HGBUR, BILIRUBINUR, KETONESUR, PROTEINUR, UROBILINOGEN, NITRITE, LEUKOCYTESUR in the last 72 hours.  Invalid input(s): APPERANCEUR    Imaging: Dg Shoulder Right  09/30/2014   CLINICAL DATA:  Acute  onset of right shoulder pain. Slid out of chair due to weakness. Initial encounter.  EXAM: RIGHT SHOULDER - 2+ VIEW  COMPARISON:  Right shoulder radiographs performed 04/10/2014  FINDINGS: There is no evidence of fracture or dislocation. The right humeral head is seated within the glenoid fossa. Mild sclerotic change is noted at the right glenohumeral joint. Mild degenerative change is noted at the right acromioclavicular joint. No significant soft tissue abnormalities are seen. The visualized portions of the right lung are clear.  IMPRESSION: No evidence of fracture or dislocation. Mild chronic sclerotic change at the right glenohumeral joint.   Electronically Signed   By: Garald Balding M.D.   On: 09/30/2014 01:00   Ct Head Wo  Contrast  09/30/2014   CLINICAL DATA:  Initial evaluation for acute trauma, fall.  EXAM: CT HEAD WITHOUT CONTRAST  CT CERVICAL SPINE WITHOUT CONTRAST  TECHNIQUE: Multidetector CT imaging of the head and cervical spine was performed following the standard protocol without intravenous contrast. Multiplanar CT image reconstructions of the cervical spine were also generated.  COMPARISON:  Prior study from 04/10/2014.  FINDINGS: CT HEAD FINDINGS  Generalized cerebral atrophy with chronic microvascular ischemic disease present, stable from previous. Vascular calcifications noted within the carotid siphons and distal left MCA, stable. 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  No acute large vessel territory infarct. No intracranial hemorrhage. No mass lesion, midline shift, or mass effect. Ventricular prominence related to global parenchymal volume loss present without hydrocephalus.  Scalp soft tissues within normal limits. No acute abnormality about the orbits.  Calvarium intact. Paranasal sinuses and mastoid air cells are clear.  CT CERVICAL SPINE FINDINGS  Patient is status post ACDF at the C3 through C6 levels. Hardware appears grossly intact. There appears to be complete fusion at these levels. There is 3 mm anterolisthesis of C2 on C3. Trace 2 mm anterolisthesis of C6 on C7. This is likely chronic. No fracture irregularity at the spinous process of C7 appears chronic in nature. Normal C1-2 articulations intact. Prevertebral soft tissues normal.  Multilevel degenerative disc disease and facet arthropathy present within the cervical spine, most prevalent about the C1-2 articulation.  No acute soft tissue abnormality.  Visualized lungs are clear.  IMPRESSION: CT BRAIN:  1. No acute intracranial process. 2. Advanced age-related cerebral atrophy with chronic microvascular ischemic disease, stable from prior.  CT CERVICAL SPINE:  1. No acute traumatic injury within the cervical spine. 2. Status post ACDF at C3  through C6 without complication.   Electronically Signed   By: Jeannine Boga M.D.   On: 09/30/2014 01:36   Ct Cervical Spine Wo Contrast  09/30/2014   CLINICAL DATA:  Initial evaluation for acute trauma, fall.  EXAM: CT HEAD WITHOUT CONTRAST  CT CERVICAL SPINE WITHOUT CONTRAST  TECHNIQUE: Multidetector CT imaging of the head and cervical spine was performed following the standard protocol without intravenous contrast. Multiplanar CT image reconstructions of the cervical spine were also generated.  COMPARISON:  Prior study from 04/10/2014.  FINDINGS: CT HEAD FINDINGS  Generalized cerebral atrophy with chronic microvascular ischemic disease present, stable from previous. Vascular calcifications noted within the carotid siphons and distal left MCA, stable. 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  No acute large vessel territory infarct. No intracranial hemorrhage. No mass lesion, midline shift, or mass effect. Ventricular prominence related to global parenchymal volume loss present without hydrocephalus.  Scalp soft tissues within normal limits. No acute abnormality about the orbits.  Calvarium intact. Paranasal sinuses and mastoid  air cells are clear.  CT CERVICAL SPINE FINDINGS  Patient is status post ACDF at the C3 through C6 levels. Hardware appears grossly intact. There appears to be complete fusion at these levels. There is 3 mm anterolisthesis of C2 on C3. Trace 2 mm anterolisthesis of C6 on C7. This is likely chronic. No fracture irregularity at the spinous process of C7 appears chronic in nature. Normal C1-2 articulations intact. Prevertebral soft tissues normal.  Multilevel degenerative disc disease and facet arthropathy present within the cervical spine, most prevalent about the C1-2 articulation.  No acute soft tissue abnormality.  Visualized lungs are clear.  IMPRESSION: CT BRAIN:  1. No acute intracranial process. 2. Advanced age-related cerebral atrophy with chronic microvascular  ischemic disease, stable from prior.  CT CERVICAL SPINE:  1. No acute traumatic injury within the cervical spine. 2. Status post ACDF at C3 through C6 without complication.   Electronically Signed   By: Jeannine Boga M.D.   On: 09/30/2014 01:36   Mr Shoulder Right Wo Contrast  10/01/2014   CLINICAL DATA:  Severe right shoulder pain and decreased range of motion chronically, worsening over the past several days.  EXAM: MRI OF THE RIGHT SHOULDER WITHOUT CONTRAST  TECHNIQUE: Multiplanar, multisequence MR imaging of the shoulder was performed. No intravenous contrast was administered.  COMPARISON:  Shoulder radiographs 09/30/2014  FINDINGS: Limited examination due to patient motion.  Rotator cuff: Large full-thickness retracted rotator cuff tear. The supraspinatus and infraspinatus tendons are completely torn and the upper fibers of the subscapularis tendon are torn.  Muscles: Fatty atrophy of the supraspinatus and infraspinatus muscles.  Biceps long head:  Not visualized and likely torn and retracted.  Acromioclavicular Joint: Evidence of prior decompressive surgery with acromioplasty and distal clavicle resection.  Glenohumeral Joint: Large glenohumeral joint effusion, advanced degenerative changes and moderate synovitis.  Labrum:  Poorly visualized.  Likely degenerated and torn.  Bones:  No acute bony findings.  IMPRESSION: 1. Limited examination due to patient motion. 2. Large full-thickness retracted rotator cuff tear. 3. Torn and retracted long head biceps tendon. 4. Large joint effusion and large amount of fluid in the subacromial/subdeltoid bursa. 5. Evidence of previous surgical bony decompressive surgery. 6. Glenohumeral joint degenerative changes.   Electronically Signed   By: Marijo Sanes M.D.   On: 10/01/2014 09:16     Medications:     . ALPRAZolam  0.5 mg Oral BID  . amLODipine  10 mg Oral Daily  . atorvastatin  40 mg Oral q1800  . cycloSPORINE  1 drop Both Eyes BID  . furosemide  40  mg Oral BID  . heparin  5,000 Units Subcutaneous 3 times per day  . hydrALAZINE  50 mg Oral TID  . losartan  100 mg Oral Daily  . pantoprazole  40 mg Oral QAC breakfast  . rOPINIRole  3 mg Oral QHS  . sodium chloride  3 mL Intravenous Q12H   sodium chloride, sodium chloride, acetaminophen **OR** acetaminophen, alteplase, heparin, hydrALAZINE, HYDROcodone-acetaminophen, ipratropium-albuterol, labetalol, lidocaine (PF), lidocaine-prilocaine, ondansetron **OR** ondansetron (ZOFRAN) IV, pentafluoroprop-tetrafluoroeth, senna-docusate  Assessment/ Plan:  69 y.o. male  with end-stage renal disease on hemodialysis Monday, Wednesday, Friday, hypertension, COPD, GERD, gout, left upper extremity AV fistula, restless leg syndrome, hyperlipidemia who was admitted for generalized weakness.  1.  End-stage renal disease on hemodialysis Monday, Wednesday, Friday. The patient missed his regularly scheduled dialysis treatment yesterday secondary to weakness.   -Pt due for HD today, orders prepared, will continue to monitor during dialysis  treatments.   2. Anemia chronic kidney disease.  Hgb 9.5, will start epogen 10000 units IV with HD.   3.  Secondary hyperparathyroidism. Phos down to 5.5.  4.  Generalized weakness.  PT evaluation.   5. HTN:  BP 148/70, continue amlodipine, losartan.   LOS:  Troy Gallagher 9/2/201612:43 PM

## 2014-10-01 NOTE — Care Management (Signed)
Patient admitted under observation. Spoke with patient and his daughter- [name]  Stone.  Patient lives with his daughter [name].  He has a  walker and a wheelchair.  He uses the walker to help him transfer to a wheelchair which he uses to "get around."  He is followed by [facility] for personal care service twice weekly.  He is also seen by a physical therapist and a nurse but does not remember the name of the agency.  Found that agency is [facility] and has a recent start of care.  Patient has been experiencing pain in his left shoulder for about the past three months but today it is severe .  MRI shows extensive rotator cuff full thickness tear, bicep tear and joint effusion.  Physical therapy has recommended skilled nursing placement.  Discussed observation status vs inpatient status and how this affects medicare coverage for skilled nursing.  [name] verbalizes understanding that a skilled nursing stay at present will not be covered by medicare.  Discussed the secondary review process, also if met inpatient criteria- would have to "meet medical necessity for the three night stay'.  

## 2014-10-01 NOTE — Progress Notes (Signed)
[facility] NAME: [place]    MR#:  [id number]  DATE OF BIRTH:  05-31-1945  SUBJECTIVE:  CHIEF COMPLAINT:   Chief Complaint  Patient presents with  . Weakness   -Very nonspecific complaints. Admitted for weakness. Troponin barely positive. -Patient complains of significant right shoulder pain and decreased range of motion going on for a month, worsening over the past few days , denies any significant physical activity precipitating the pain. Denies any trauma. Cannot take care of himself. Try to use the bedpan but not able to roll in the bed due to significant pains in the shoulder and weakness -Missed dialysis day on admission. Will have hemodialysis today via AV fistula on in the left upper arm. - Not a great historian.   REVIEW OF SYSTEMS:  Review of Systems  Constitutional: Negative for fever and chills.  HENT: Negative for ear discharge and ear pain.   Eyes: Negative for blurred vision.  Respiratory: Negative for cough, shortness of breath and wheezing.   Cardiovascular: Negative for chest pain and palpitations.  Gastrointestinal: Negative for heartburn, nausea, vomiting, abdominal pain, diarrhea and constipation.  Genitourinary: Negative for dysuria.  Musculoskeletal: Positive for myalgias, back pain and joint pain.  Neurological: Positive for weakness. Negative for dizziness, sensory change, speech change, seizures and headaches.    DRUG ALLERGIES:   Allergies  Allergen Reactions  . Sulfur Hives and Rash    VITALS:  Blood pressure 160/72, pulse 79, temperature 99.5 F (37.5 C), temperature source Oral, resp. rate 18, height 5\' 9"  (1.753 m), weight 103.42 kg (228 lb), SpO2 99 %.  PHYSICAL EXAMINATION:  Physical Exam  GENERAL:  69 y.o.-year-old patient lying in the bed in moderate distress, significant pain in the right shoulder. Not able to move around in the bed, not able to use, but then. Right shoulder  pain on palpation and passive movements.  EYES: Pupils equal, round, reactive to light and accommodation. No scleral icterus. Extraocular muscles intact.  HEENT: Head atraumatic, normocephalic. Oropharynx and nasopharynx clear.  NECK:  Supple, no jugular venous distention. No thyroid enlargement, no tenderness.  LUNGS: Normal breath sounds bilaterally, no wheezing, rales,rhonchi or crepitation. No use of accessory muscles of respiration. Decreased bibasilar breath sounds. CARDIOVASCULAR: S1, S2 normal. No rubs, or gallops. 3/6 systolic murmur. ABDOMEN: Soft, nontender, nondistended. Bowel sounds present. No organomegaly or mass.  EXTREMITIES: No pedal edema, cyanosis, or clubbing.  Significant pain of the right shoulder on movement especially in the posterior part. Adduction and internal rotation are fine especially abduction and flexion, extension are limited NEUROLOGIC: Cranial nerves II through XII are intact. Muscle strength 5/5 in all extremities except the right upper extremity, limited secondary to pain.. Sensation intact. Gait not checked.  PSYCHIATRIC: The patient is alert and oriented x 3.  SKIN: No obvious rash, lesion, or ulcer.    LABORATORY PANEL:   CBC  Recent Labs Lab 09/30/14 1633  WBC 8.8  HGB 9.5*  HCT 29.6*  PLT 316   ------------------------------------------------------------------------------------------------------------------  Chemistries   Recent Labs Lab 09/30/14 0005 09/30/14 0516 09/30/14 1633  NA 139 139 138  K 3.9 3.6 4.1  CL 101 102 101  CO2 26 23 21*  GLUCOSE 102* 92 71  BUN 64* 67* 65*  CREATININE 8.38* 8.57*  8.61* 8.56*  CALCIUM 9.4 9.3 9.6  MG  --  1.9  --   AST 18  --   --   ALT 12*  --   --  ALKPHOS 66  --   --   BILITOT 0.3  --   --    ------------------------------------------------------------------------------------------------------------------  Cardiac Enzymes  Recent Labs Lab 09/30/14 1110  TROPONINI 0.03    ------------------------------------------------------------------------------------------------------------------  RADIOLOGY:  Dg Shoulder Right  09/30/2014   CLINICAL DATA:  Acute onset of right shoulder pain. Slid out of chair due to weakness. Initial encounter.  EXAM: RIGHT SHOULDER - 2+ VIEW  COMPARISON:  Right shoulder radiographs performed 04/10/2014  FINDINGS: There is no evidence of fracture or dislocation. The right humeral head is seated within the glenoid fossa. Mild sclerotic change is noted at the right glenohumeral joint. Mild degenerative change is noted at the right acromioclavicular joint. No significant soft tissue abnormalities are seen. The visualized portions of the right lung are clear.  IMPRESSION: No evidence of fracture or dislocation. Mild chronic sclerotic change at the right glenohumeral joint.   Electronically Signed   By: Garald Balding M.D.   On: 09/30/2014 01:00   Ct Head Wo Contrast  09/30/2014   CLINICAL DATA:  Initial evaluation for acute trauma, fall.  EXAM: CT HEAD WITHOUT CONTRAST  CT CERVICAL SPINE WITHOUT CONTRAST  TECHNIQUE: Multidetector CT imaging of the head and cervical spine was performed following the standard protocol without intravenous contrast. Multiplanar CT image reconstructions of the cervical spine were also generated.  COMPARISON:  Prior study from 04/10/2014.  FINDINGS: CT HEAD FINDINGS  Generalized cerebral atrophy with chronic microvascular ischemic disease present, stable from previous. Vascular calcifications noted within the carotid siphons and distal left MCA, stable. 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  No acute large vessel territory infarct. No intracranial hemorrhage. No mass lesion, midline shift, or mass effect. Ventricular prominence related to global parenchymal volume loss present without hydrocephalus.  Scalp soft tissues within normal limits. No acute abnormality about the orbits.  Calvarium intact. Paranasal sinuses  and mastoid air cells are clear.  CT CERVICAL SPINE FINDINGS  Patient is status post ACDF at the C3 through C6 levels. Hardware appears grossly intact. There appears to be complete fusion at these levels. There is 3 mm anterolisthesis of C2 on C3. Trace 2 mm anterolisthesis of C6 on C7. This is likely chronic. No fracture irregularity at the spinous process of C7 appears chronic in nature. Normal C1-2 articulations intact. Prevertebral soft tissues normal.  Multilevel degenerative disc disease and facet arthropathy present within the cervical spine, most prevalent about the C1-2 articulation.  No acute soft tissue abnormality.  Visualized lungs are clear.  IMPRESSION: CT BRAIN:  1. No acute intracranial process. 2. Advanced age-related cerebral atrophy with chronic microvascular ischemic disease, stable from prior.  CT CERVICAL SPINE:  1. No acute traumatic injury within the cervical spine. 2. Status post ACDF at C3 through C6 without complication.   Electronically Signed   By: Jeannine Boga M.D.   On: 09/30/2014 01:36   Ct Cervical Spine Wo Contrast  09/30/2014   CLINICAL DATA:  Initial evaluation for acute trauma, fall.  EXAM: CT HEAD WITHOUT CONTRAST  CT CERVICAL SPINE WITHOUT CONTRAST  TECHNIQUE: Multidetector CT imaging of the head and cervical spine was performed following the standard protocol without intravenous contrast. Multiplanar CT image reconstructions of the cervical spine were also generated.  COMPARISON:  Prior study from 04/10/2014.  FINDINGS: CT HEAD FINDINGS  Generalized cerebral atrophy with chronic microvascular ischemic disease present, stable from previous. Vascular calcifications noted within the carotid siphons and distal left MCA, stable. 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  No acute large vessel territory infarct. No intracranial hemorrhage. No mass lesion, midline shift, or mass effect. Ventricular prominence related to global parenchymal volume loss present  without hydrocephalus.  Scalp soft tissues within normal limits. No acute abnormality about the orbits.  Calvarium intact. Paranasal sinuses and mastoid air cells are clear.  CT CERVICAL SPINE FINDINGS  Patient is status post ACDF at the C3 through C6 levels. Hardware appears grossly intact. There appears to be complete fusion at these levels. There is 3 mm anterolisthesis of C2 on C3. Trace 2 mm anterolisthesis of C6 on C7. This is likely chronic. No fracture irregularity at the spinous process of C7 appears chronic in nature. Normal C1-2 articulations intact. Prevertebral soft tissues normal.  Multilevel degenerative disc disease and facet arthropathy present within the cervical spine, most prevalent about the C1-2 articulation.  No acute soft tissue abnormality.  Visualized lungs are clear.  IMPRESSION: CT BRAIN:  1. No acute intracranial process. 2. Advanced age-related cerebral atrophy with chronic microvascular ischemic disease, stable from prior.  CT CERVICAL SPINE:  1. No acute traumatic injury within the cervical spine. 2. Status post ACDF at C3 through C6 without complication.   Electronically Signed   By: Jeannine Boga M.D.   On: 09/30/2014 01:36   Mr Shoulder Right Wo Contrast  10/01/2014   CLINICAL DATA:  Severe right shoulder pain and decreased range of motion chronically, worsening over the past several days.  EXAM: MRI OF THE RIGHT SHOULDER WITHOUT CONTRAST  TECHNIQUE: Multiplanar, multisequence MR imaging of the shoulder was performed. No intravenous contrast was administered.  COMPARISON:  Shoulder radiographs 09/30/2014  FINDINGS: Limited examination due to patient motion.  Rotator cuff: Large full-thickness retracted rotator cuff tear. The supraspinatus and infraspinatus tendons are completely torn and the upper fibers of the subscapularis tendon are torn.  Muscles: Fatty atrophy of the supraspinatus and infraspinatus muscles.  Biceps long head:  Not visualized and likely torn and  retracted.  Acromioclavicular Joint: Evidence of prior decompressive surgery with acromioplasty and distal clavicle resection.  Glenohumeral Joint: Large glenohumeral joint effusion, advanced degenerative changes and moderate synovitis.  Labrum:  Poorly visualized.  Likely degenerated and torn.  Bones:  No acute bony findings.  IMPRESSION: 1. Limited examination due to patient motion. 2. Large full-thickness retracted rotator cuff tear. 3. Torn and retracted long head biceps tendon. 4. Large joint effusion and large amount of fluid in the subacromial/subdeltoid bursa. 5. Evidence of previous surgical bony decompressive surgery. 6. Glenohumeral joint degenerative changes.   Electronically Signed   By: Marijo Sanes M.D.   On: 10/01/2014 09:16    EKG:   Orders placed or performed during the hospital encounter of 09/29/14  . EKG 12-Lead  . EKG 12-Lead  . EKG 12-Lead  . EKG 12-Lead    ASSESSMENT AND PLAN:   69 year old male with past medical history significant for end-stage renal disease on Monday Wednesday Friday hemodialysis, hypertension, GERD, history of CVA, diabetes presents to the hospital with generalized weakness, missing hemodialysis.  #1 generalized weakness and right shoulder pain-no infection noted. -Physical therapy consult is ordered. However, patient has significant discomfort and pain on movements. I'm afraid that he will not do well at home and in fact may deteriorate, including possible falls. - Lives at home with his daughter. Would benefit from skilled nursing facility placement  #2 right shoulder pain and limited range of motion due to large full-thickness retracted rotator cuff tear, question recent-denies any trauma. It orthopedist surgeon consultation  for possible procedures and pain management.  -X-rays did not show any acute dislocation or fractures.  #3 Elevated troponin- no chest pain, likely from his ESRD -  get cardiologist involved for further recommendations as  patient's pain in the right shoulder could be referred  #4 ESRD on HD- Mon-wed--Fri schedule, missed D on admission hemodialysis today - Nephrology consult is appreciated  #5 HTN- elevated BP, on norvasc and losartan - added PO hydralazine, blood pressure is still high, may need to advance - on hydralazine and labetelol prn  #6 DVT prophylaxis-subcutaneous heparin.  All the records are reviewed and case discussed with Care Management/Social Workerr. Management plans discussed with the patient, family and they are in agreement.  CODE STATUS: Full code  TOTAL TIME TAKING CARE OF THIS PATIENT: 40 minutes.   Marland Kitchen   Theodoro Grist M.D on 10/01/2014 at 2:41 PM  Between 7am to 6pm - Pager - [phone]  After 6pm go to [email or web address] - password [email or web address] [facility]  Office  [phone]  CC: Primary care physician; Lelon Huh, MD

## 2014-10-01 NOTE — Progress Notes (Signed)
Alert and dependant. Incontinent. Took pills well. Dialysis patient. No s/s distress. On RA.

## 2014-10-01 NOTE — Progress Notes (Signed)
PRE HD   

## 2014-10-02 NOTE — Clinical Social Work Note (Signed)
Clinical Social Work Assessment  Patient Details  Name: Troy Gallagher MRN: [id number] Date of Birth: 02/18/1945  Date of referral:  10/02/14               Reason for consult:  Facility Placement                Permission sought to share information with:  [place] granted to share information::  Yes, Verbal Permission Granted  Name::      [place]::   [place]   Relationship::     Contact Information:     Housing/Transportation Living arrangements for the past 2 months:  [place] of Information:  Patient Patient Interpreter Needed:  None Criminal Activity/Legal Involvement Pertinent to Current Situation/Hospitalization:  No - Comment as needed Significant Relationships:  Adult Children Lives with:  Adult Children Do you feel safe going back to the place where you live?  Yes Need for family participation in patient care:  Yes (Comment)  Care giving concerns: Patient lives with his daughter [place] in [place], [place].    Social Worker assessment / plan: [place] (CSW) met with patient to discuss D/C plan. Patient changed from observation to inpatient on 10/01/14. Patient was sitting up in the chair and alert and oriented. CSW introduced self and explained role of CSW department. Patient reported that he lives with his daughter [place] in [place]. Patient reported that he goes to dialysis M,W,F at [facility] ([place] in [place] explained that PT is recommending SNF and that he will need a 3 night inpatient qualifying stay in order for his medicare to pay for rehab. Patient verbalized his understanding. Patient is agreeable to SNF search in [place]. If patient is medially ready befor Monday patient is agreeable to going home with home health. RN Case Manager aware of above.   FL2 complete and on chart.   Employment status:  Retired Forensic scientist:  [employer] PT Recommendations:  [place] / Referral to community resources:  [place]  Patient/Family's Response to care: Patient is agreeable to [place] in [place].   Patient/Family's Understanding of and Emotional Response to Diagnosis, Current Treatment, and Prognosis: Patient thanked CSW for visit.   Emotional Assessment Appearance:  Appears stated age Attitude/Demeanor/Rapport:    Affect (typically observed):  Accepting, Adaptable, Pleasant Orientation:  Oriented to Self, Oriented to Place, Oriented to  Time, Oriented to Situation Alcohol / Substance use:  Not Applicable Psych involvement (Current and /or in the community):  No (Comment)  Discharge Needs  Concerns to be addressed:  Discharge Planning Concerns Readmission within the last 30 days:  No Current discharge risk:  Dependent with Mobility Barriers to Discharge:  Continued Medical Work up   [place], LCSW 10/02/2014, 3:26 PM

## 2014-10-02 NOTE — Progress Notes (Signed)
A & O. NSR. Room air. Takes meds ok. Up to chair and tolerated it well. Tolerating diet well. HD yesterday. Condom cath. Pt has not reported any pain. Pt has no further concerns at this time.

## 2014-10-02 NOTE — Progress Notes (Signed)
[facility]  ROUNDING NOTE   Subjective:  Hemodialysis yesterday. Tolerated treatment well. UF of  1 litre Currently sitting up in chair, eating breakfast.  Less shoulder pain and weakness today  Objective:  Vital signs in last 24 hours:  Temp:  [97.5 F (36.4 C)-99.5 F (37.5 C)] 98.3 F (36.8 C) (09/03 0551) Pulse Rate:  [69-88] 69 (09/03 0713) Resp:  [16-24] 18 (09/03 0713) BP: (117-169)/(47-72) 151/57 mmHg (09/03 0713) SpO2:  [95 %-99 %] 98 % (09/03 0713) Weight:  [104.6 kg (230 lb 9.6 oz)-105.3 kg (232 lb 2.3 oz)] 104.6 kg (230 lb 9.6 oz) (09/02 1745)  Weight change: -1 kg (-2 lb 3.3 oz) Filed Weights   09/30/14 1722 10/01/14 1415 10/01/14 1745  Weight: 103.42 kg (228 lb) 105.3 kg (232 lb 2.3 oz) 104.6 kg (230 lb 9.6 oz)    Intake/Output: I/O last 3 completed shifts: In: 240 [P.O.:240] Out: 1100 [Urine:100; Other:1000]   Intake/Output this shift:     Physical Exam: General: NAD, sitting in bed  Head: Normocephalic, atraumatic. Moist oral mucosal membranes  Eyes: Anicteric, PERRL  Neck: Supple, trachea midline  Lungs:  Clear to auscultation normal effort  Heart: Regular rate and rhythm  Abdomen:  Soft, nontender, BS present  Extremities:  no peripheral edema.  Neurologic: Nonfocal, moving all four extremities  Skin: No lesions  GU Condom catheter with urine  Access: LUE AVF    Basic Metabolic Panel:  Recent Labs Lab 09/30/14 0005 09/30/14 0516 09/30/14 1633  NA 139 139 138  K 3.9 3.6 4.1  CL 101 102 101  CO2 26 23 21*  GLUCOSE 102* 92 71  BUN 64* 67* 65*  CREATININE 8.38* 8.57*  8.61* 8.56*  CALCIUM 9.4 9.3 9.6  MG  --  1.9  --   PHOS  --  6.5* 5.5*    Liver Function Tests:  Recent Labs Lab 09/30/14 0005 09/30/14 1633  AST 18  --   ALT 12*  --   ALKPHOS 66  --   BILITOT 0.3  --   PROT 7.6  --   ALBUMIN 3.4* 3.1*   No results for input(s): LIPASE, AMYLASE in the last 168 hours. No results for input(s): AMMONIA in the  last 168 hours.  CBC:  Recent Labs Lab 09/30/14 0516 09/30/14 1633  WBC 8.8 8.8  HGB 10.1* 9.5*  HCT 30.3* 29.6*  MCV 94.9 95.0  PLT 319 316    Cardiac Enzymes:  Recent Labs Lab 09/30/14 0005 09/30/14 0516 09/30/14 1110  TROPONINI 0.04* 0.04* 0.03    BNP: Invalid input(s): POCBNP  CBG: No results for input(s): GLUCAP in the last 168 hours.  Microbiology: Results for orders placed or performed during the hospital encounter of 10/24/09  Surgical pcr screen     Status: None   Collection Time: 10/19/09  2:01 PM  Result Value Ref Range Status   MRSA, PCR NEGATIVE NEGATIVE Final   Staphylococcus aureus  NEGATIVE Final    NEGATIVE        The Xpert SA Assay (FDA approved for NASAL specimens only), is one component of a comprehensive surveillance program.  It is not intended to diagnose infection nor to guide or monitor treatment.    Coagulation Studies:  Recent Labs  09/30/14 0005  LABPROT 15.0  INR 1.16    Urinalysis: No results for input(s): COLORURINE, LABSPEC, PHURINE, GLUCOSEU, HGBUR, BILIRUBINUR, KETONESUR, PROTEINUR, UROBILINOGEN, NITRITE, LEUKOCYTESUR in the last 72 hours.  Invalid input(s): APPERANCEUR  Imaging: Mr Shoulder Right Wo Contrast  10/01/2014   CLINICAL DATA:  Severe right shoulder pain and decreased range of motion chronically, worsening over the past several days.  EXAM: MRI OF THE RIGHT SHOULDER WITHOUT CONTRAST  TECHNIQUE: Multiplanar, multisequence MR imaging of the shoulder was performed. No intravenous contrast was administered.  COMPARISON:  Shoulder radiographs 09/30/2014  FINDINGS: Limited examination due to patient motion.  Rotator cuff: Large full-thickness retracted rotator cuff tear. The supraspinatus and infraspinatus tendons are completely torn and the upper fibers of the subscapularis tendon are torn.  Muscles: Fatty atrophy of the supraspinatus and infraspinatus muscles.  Biceps long head:  Not visualized and likely torn  and retracted.  Acromioclavicular Joint: Evidence of prior decompressive surgery with acromioplasty and distal clavicle resection.  Glenohumeral Joint: Large glenohumeral joint effusion, advanced degenerative changes and moderate synovitis.  Labrum:  Poorly visualized.  Likely degenerated and torn.  Bones:  No acute bony findings.  IMPRESSION: 1. Limited examination due to patient motion. 2. Large full-thickness retracted rotator cuff tear. 3. Torn and retracted long head biceps tendon. 4. Large joint effusion and large amount of fluid in the subacromial/subdeltoid bursa. 5. Evidence of previous surgical bony decompressive surgery. 6. Glenohumeral joint degenerative changes.   Electronically Signed   By: Marijo Sanes M.D.   On: 10/01/2014 09:16     Medications:     . ALPRAZolam  0.5 mg Oral BID  . amLODipine  10 mg Oral Daily  . atorvastatin  40 mg Oral q1800  . cycloSPORINE  1 drop Both Eyes BID  . [START ON 10/04/2014] epoetin (EPOGEN/PROCRIT) injection  10,000 Units Intravenous Q M,W,F-HD  . furosemide  40 mg Oral BID  . heparin  5,000 Units Subcutaneous 3 times per day  . hydrALAZINE  50 mg Oral TID  . losartan  100 mg Oral Daily  . pantoprazole  40 mg Oral QAC breakfast  . rOPINIRole  3 mg Oral QHS   sodium chloride, sodium chloride, acetaminophen **OR** acetaminophen, alteplase, heparin, hydrALAZINE, HYDROcodone-acetaminophen, ipratropium-albuterol, labetalol, lidocaine (PF), lidocaine-prilocaine, ondansetron **OR** ondansetron (ZOFRAN) IV, pentafluoroprop-tetrafluoroeth, senna-docusate, sodium chloride  Assessment/ Plan:  69 y.o. male  with end-stage renal disease on hemodialysis Monday, Wednesday, Friday, hypertension, COPD, GERD, gout, left upper extremity AV fistula, restless leg syndrome, hyperlipidemia who was admitted for generalized weakness.  1.  End-stage renal disease on hemodialysis Monday, Wednesday, Friday. Hemodialysis yesterday, tolerated well. UF of 1 litre - plan on  next treatment for Monday.   2. Anemia chronic kidney disease.  Hgb 9.5, epogen 10000 units IV with HD.   3.  Secondary hyperparathyroidism. Phos down to 5.5. - not currently on binders.   4.  HTN:  Well controlled - continue amlodipine furosemide, hydralazine, losartan.   LOSJuleen China, Lurena Nida 9/3/20169:48 AM

## 2014-10-02 NOTE — Consult Note (Signed)
[facility] Cardiology  CARDIOLOGY CONSULT NOTE  Patient ID: Troy Gallagher MRN: [id number] DOB/AGE: [birth year] 70 y.o.  Admit date: 09/29/2014 Referring Physician Theodoro Grist MD Primary Physician Lelon Huh M.D. Primary Cardiologist  Reason for Consultation elevated troponin  HPI: The patient is a 69 year old gentleman with known end-stage renal disease on chronic hemodialysis. He presented to [facility] emergency room with complaints of generalized weakness. Admission labs were notable for borderline elevated troponin of 0.04 and the absence of chest pain. EKG revealed normal sinus rhythm normal ECG. Follow-up troponin was normal at 0.03. The patient continues to deny chest pain. He feels much better today.  Review of systems complete and found to be negative unless listed above     Past Medical History  Diagnosis Date  . Complication of anesthesia   . Hypertension   . Stroke   . GERD (gastroesophageal reflux disease)   . Headache   . Kidney dialysis status [id number]  . HLD (hyperlipidemia)   . Diabetes mellitus, type II   . Seizure   . BPH (benign prostatic hyperplasia)   . COPD (chronic obstructive pulmonary disease)   . PVD (peripheral vascular disease)   . Carotid stenosis   . Adenoma of large intestine   . ESRD on hemodialysis     MWF  . Anxiety   . RLS (restless legs syndrome)     Past Surgical History  Procedure Laterality Date  . Shoulder arthroscopy w/ rotator cuff repair Left 1999  . Neck surgery N/A 10/24/2009    Cervical Diskectomy- C3-4, 4-5, 5-6 anterior disckectomy by Dr. Arnoldo Morale at [facility]  . Av fistula placement Left 2 years  . Colonoscopy N/A 06/14/2014    Procedure: COLONOSCOPY;  Surgeon: Hulen Luster, MD;  Location: [facility] ENDOSCOPY;  Service: Gastroenterology;  Laterality: N/A;  . Carotid endarterectomy Right 06/05/2013    Dr. Delana Meyer  . Tonsillectomy and adenoidectomy  1950  . 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 Repeat in 3 years  . Carotid doppler ultrasound  04/22/2013    85-90% stenosis of the RICA, 60-70% stenosis of left cmmon carotid and origin of left subclavian  . Doppler echocardiography  12/04/2011    Bilateral stenosis 80%  . Arch aortogram  04/07/2013    85-90% Stenosis origin of right ICA. 65-70% narrowing of left CCA. 50-60% narrowing of left subclavian artery    Prescriptions prior to admission  Medication Sig Dispense Refill Last Dose  . allopurinol (ZYLOPRIM) 100 MG tablet Take 100 mg by mouth daily.   Past Week at Unknown time  . ALPRAZolam (XANAX) 0.5 MG tablet Take 1 tablet (0.5 mg total) by mouth 2 (two) times daily. 180 tablet 1 Past Week at Unknown time  . amLODipine (NORVASC) 10 MG tablet Take 10 mg by mouth daily.   Past Week at Unknown time  . amoxicillin-clavulanate (AUGMENTIN) 875-125 MG per tablet Take 1 tablet by mouth 2 (two) times daily. 20 tablet 0 Past Week at Unknown time  . atorvastatin (LIPITOR) 40 MG tablet Take 40 mg by mouth daily at 6 PM.   Past Week at Unknown time  . azelastine (ASTELIN) 0.1 % nasal spray Place 2 sprays into both nostrils daily. Use in each nostril as directed   Past Week at Unknown time  . clarithromycin (BIAXIN) 500 MG tablet Take 1 tablet (500 mg total) by mouth 2 (two) times daily. 20 tablet 0 Past Week at Unknown time  .  esomeprazole (NEXIUM) 40 MG capsule Take 1 capsule (40 mg total) by mouth 2 (two) times daily. 60 capsule 6 Past Week at Unknown time  . furosemide (LASIX) 40 MG tablet Take 80 mg by mouth daily. On dialysis days   Past Week at Unknown time  . indomethacin (INDOCIN) 50 MG capsule Take 50 mg by mouth 2 (two) times daily with a meal.   Past Week at Unknown time  . lidocaine-prilocaine (EMLA) cream Apply 1 application topically as needed (before dialysis).   Past Week at Unknown time  . loratadine (CLARITIN) 10 MG tablet Take 10 mg by mouth every other day.   Past Week at Unknown time  . losartan  (COZAAR) 100 MG tablet Take 100 mg by mouth daily.   Past Week at Unknown time  . meclizine (ANTIVERT) 25 MG tablet Take 25 mg by mouth 2 (two) times daily as needed for dizziness.   Past Week at Unknown time  . Multiple Vitamin (MULTIVITAMIN WITH MINERALS) TABS tablet Take 1 tablet by mouth daily.   Past Week at Unknown time  . ranitidine (ZANTAC) 150 MG tablet Take 150 mg by mouth at bedtime.   Past Week at Unknown time  . rOPINIRole (REQUIP) 1 MG tablet Take 1 mg by mouth at bedtime.   Past Week at Unknown time  . acetaminophen (TYLENOL) 325 MG tablet Take 650 mg by mouth every 6 (six) hours as needed for mild pain or headache.   prn at prn  . diphenhydrAMINE (BENADRYL) 25 MG tablet Take 25 mg by mouth at bedtime as needed for itching or sleep.   prn at prn  . Ipratropium-Albuterol (COMBIVENT IN) Inhale 2 puffs into the lungs 3 (three) times daily as needed (SOB).   prn at prn  . [DISCONTINUED] cycloSPORINE (RESTASIS) 0.05 % ophthalmic emulsion Place 1 drop into both eyes 2 (two) times daily.   Taking   Social History   Social History  . Marital Status: Widowed    Spouse Name: N/A  . Number of Children: N/A  . Years of Education: N/A   Occupational History  . Disabled    Social History Main Topics  . Smoking status: Former Smoker -- 2.00 packs/day for 30 years    Types: Cigarettes    Quit date: 01/29/2002  . Smokeless tobacco: Current User  . Alcohol Use: No  . Drug Use: No  . Sexual Activity: Not on file   Other Topics Concern  . Not on file   Social History Narrative    Family History  Problem Relation Age of Onset  . Stroke Mother   . Prostate cancer Father   . Heart disease Sister       Review of systems complete and found to be negative unless listed above      PHYSICAL EXAM  General: Well developed, well nourished, in no acute distress HEENT:  Normocephalic and atramatic Neck:  No JVD.  Lungs: Clear bilaterally to auscultation and percussion. Heart: HRRR  . Normal S1 and S2 without gallops or murmurs.  Abdomen: Bowel sounds are positive, abdomen soft and non-tender  Msk:  Back normal, normal gait. Normal strength and tone for age. Extremities: No clubbing, cyanosis or edema.   Neuro: Alert and oriented X 3. Psych:  Good affect, responds appropriately  Labs:   Lab Results  Component Value Date   WBC 8.8 09/30/2014   HGB 9.5* 09/30/2014   HCT 29.6* 09/30/2014   MCV 95.0 09/30/2014   PLT 316  09/30/2014    Recent Labs Lab 09/30/14 0005  09/30/14 1633  NA 139  < > 138  K 3.9  < > 4.1  CL 101  < > 101  CO2 26  < > 21*  BUN 64*  < > 65*  CREATININE 8.38*  < > 8.56*  CALCIUM 9.4  < > 9.6  PROT 7.6  --   --   BILITOT 0.3  --   --   ALKPHOS 66  --   --   ALT 12*  --   --   AST 18  --   --   GLUCOSE 102*  < > 71  < > = values in this interval not displayed. Lab Results  Component Value Date   TROPONINI 0.03 09/30/2014    Lab Results  Component Value Date   CHOL 151 12/04/2011   Lab Results  Component Value Date   HDL 31* 12/04/2011   Lab Results  Component Value Date   LDLCALC 76 12/04/2011   Lab Results  Component Value Date   TRIG 219* 12/04/2011   No results found for: CHOLHDL No results found for: LDLDIRECT    Radiology: Dg Chest 2 View  09/28/2014   CLINICAL DATA:  Productive cough and fever.  Asthma.  EXAM: CHEST  2 VIEW  COMPARISON:  04/13/2014  FINDINGS: Mild cardiomegaly remains stable. No evidence of pulmonary infiltrate or edema. No evidence of pleural effusion. Cervical spine fusion hardware again noted.  IMPRESSION: Stable mild cardiomegaly.  No active lung disease.   Electronically Signed   By: Earle Gell M.D.   On: 09/28/2014 16:44   Dg Shoulder Right  09/30/2014   CLINICAL DATA:  Acute onset of right shoulder pain. Slid out of chair due to weakness. Initial encounter.  EXAM: RIGHT SHOULDER - 2+ VIEW  COMPARISON:  Right shoulder radiographs performed 04/10/2014  FINDINGS: There is no evidence of  fracture or dislocation. The right humeral head is seated within the glenoid fossa. Mild sclerotic change is noted at the right glenohumeral joint. Mild degenerative change is noted at the right acromioclavicular joint. No significant soft tissue abnormalities are seen. The visualized portions of the right lung are clear.  IMPRESSION: No evidence of fracture or dislocation. Mild chronic sclerotic change at the right glenohumeral joint.   Electronically Signed   By: Garald Balding M.D.   On: 09/30/2014 01:00   Ct Head Wo Contrast  09/30/2014   CLINICAL DATA:  Initial evaluation for acute trauma, fall.  EXAM: CT HEAD WITHOUT CONTRAST  CT CERVICAL SPINE WITHOUT CONTRAST  TECHNIQUE: Multidetector CT imaging of the head and cervical spine was performed following the standard protocol without intravenous contrast. Multiplanar CT image reconstructions of the cervical spine were also generated.  COMPARISON:  Prior study from 04/10/2014.  FINDINGS: CT HEAD FINDINGS  Generalized cerebral atrophy with chronic microvascular ischemic disease present, stable from previous. Vascular calcifications noted within the carotid siphons and distal left MCA, stable. 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  No acute large vessel territory infarct. No intracranial hemorrhage. No mass lesion, midline shift, or mass effect. Ventricular prominence related to global parenchymal volume loss present without hydrocephalus.  Scalp soft tissues within normal limits. No acute abnormality about the orbits.  Calvarium intact. Paranasal sinuses and mastoid air cells are clear.  CT CERVICAL SPINE FINDINGS  Patient is status post ACDF at the C3 through C6 levels. Hardware appears grossly intact. There appears to be complete fusion at these  levels. There is 3 mm anterolisthesis of C2 on C3. Trace 2 mm anterolisthesis of C6 on C7. This is likely chronic. No fracture irregularity at the spinous process of C7 appears chronic in nature. Normal  C1-2 articulations intact. Prevertebral soft tissues normal.  Multilevel degenerative disc disease and facet arthropathy present within the cervical spine, most prevalent about the C1-2 articulation.  No acute soft tissue abnormality.  Visualized lungs are clear.  IMPRESSION: CT BRAIN:  1. No acute intracranial process. 2. Advanced age-related cerebral atrophy with chronic microvascular ischemic disease, stable from prior.  CT CERVICAL SPINE:  1. No acute traumatic injury within the cervical spine. 2. Status post ACDF at C3 through C6 without complication.   Electronically Signed   By: Jeannine Boga M.D.   On: 09/30/2014 01:36   Ct Cervical Spine Wo Contrast  09/30/2014   CLINICAL DATA:  Initial evaluation for acute trauma, fall.  EXAM: CT HEAD WITHOUT CONTRAST  CT CERVICAL SPINE WITHOUT CONTRAST  TECHNIQUE: Multidetector CT imaging of the head and cervical spine was performed following the standard protocol without intravenous contrast. Multiplanar CT image reconstructions of the cervical spine were also generated.  COMPARISON:  Prior study from 04/10/2014.  FINDINGS: CT HEAD FINDINGS  Generalized cerebral atrophy with chronic microvascular ischemic disease present, stable from previous. Vascular calcifications noted within the carotid siphons and distal left MCA, stable. 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  No acute large vessel territory infarct. No intracranial hemorrhage. No mass lesion, midline shift, or mass effect. Ventricular prominence related to global parenchymal volume loss present without hydrocephalus.  Scalp soft tissues within normal limits. No acute abnormality about the orbits.  Calvarium intact. Paranasal sinuses and mastoid air cells are clear.  CT CERVICAL SPINE FINDINGS  Patient is status post ACDF at the C3 through C6 levels. Hardware appears grossly intact. There appears to be complete fusion at these levels. There is 3 mm anterolisthesis of C2 on C3. Trace 2 mm  anterolisthesis of C6 on C7. This is likely chronic. No fracture irregularity at the spinous process of C7 appears chronic in nature. Normal C1-2 articulations intact. Prevertebral soft tissues normal.  Multilevel degenerative disc disease and facet arthropathy present within the cervical spine, most prevalent about the C1-2 articulation.  No acute soft tissue abnormality.  Visualized lungs are clear.  IMPRESSION: CT BRAIN:  1. No acute intracranial process. 2. Advanced age-related cerebral atrophy with chronic microvascular ischemic disease, stable from prior.  CT CERVICAL SPINE:  1. No acute traumatic injury within the cervical spine. 2. Status post ACDF at C3 through C6 without complication.   Electronically Signed   By: Jeannine Boga M.D.   On: 09/30/2014 01:36   Mr Shoulder Right Wo Contrast  10/01/2014   CLINICAL DATA:  Severe right shoulder pain and decreased range of motion chronically, worsening over the past several days.  EXAM: MRI OF THE RIGHT SHOULDER WITHOUT CONTRAST  TECHNIQUE: Multiplanar, multisequence MR imaging of the shoulder was performed. No intravenous contrast was administered.  COMPARISON:  Shoulder radiographs 09/30/2014  FINDINGS: Limited examination due to patient motion.  Rotator cuff: Large full-thickness retracted rotator cuff tear. The supraspinatus and infraspinatus tendons are completely torn and the upper fibers of the subscapularis tendon are torn.  Muscles: Fatty atrophy of the supraspinatus and infraspinatus muscles.  Biceps long head:  Not visualized and likely torn and retracted.  Acromioclavicular Joint: Evidence of prior decompressive surgery with acromioplasty and distal clavicle resection.  Glenohumeral Joint: Large glenohumeral  joint effusion, advanced degenerative changes and moderate synovitis.  Labrum:  Poorly visualized.  Likely degenerated and torn.  Bones:  No acute bony findings.  IMPRESSION: 1. Limited examination due to patient motion. 2. Large  full-thickness retracted rotator cuff tear. 3. Torn and retracted long head biceps tendon. 4. Large joint effusion and large amount of fluid in the subacromial/subdeltoid bursa. 5. Evidence of previous surgical bony decompressive surgery. 6. Glenohumeral joint degenerative changes.   Electronically Signed   By: Marijo Sanes M.D.   On: 10/01/2014 09:16    EKG: Normal sinus rhythm normal ECG  ASSESSMENT AND PLAN:   1. Borderline elevated troponin, without peak or trough, now normal, and the absence of chest pain, abnormal ECG, likely due to ESRD 2. ESRD on chronic hemodialysis  Recommendations  1. Agree with current therapy 2. Defer full dose anticoagulation 3. Defer further cardiac diagnostics at this time 4. Patient may be discharged home from a cardiovascular perspective  Signed: Breyanna Valera MD,PhD, [facility] 10/02/2014, 9:40 AM

## 2014-10-02 NOTE — Consult Note (Signed)
ORTHOPAEDIC CONSULTATION  REQUESTING PHYSICIAN: Theodoro Grist, MD  Chief Complaint:   Right shoulder pain and weakness.  History of Present Illness: Troy Gallagher is a 69 y.o. male with multiple medical problems who was admitted for generalized weakness apparently was complaining of right shoulder pain and weakness, prompting the hospitalist to order an MRI scan. Based on the MRI scan results, he has asked for an orthopedic consultation. The patient notes that he has had difficulty with his right shoulder for "years". His symptoms will wax and wane, but in general are aggravated by lifting objects, as well as with any activities at or above shoulder level. The patient denies any specific injury to the shoulder either recently or remotely. He has no shoulder pain at night and is able to sleep. He denies any numbness or paresthesias to his right hand, denies any neck pain.  Past Medical History  Diagnosis Date  . Complication of anesthesia   . Hypertension   . Stroke   . GERD (gastroesophageal reflux disease)   . Headache   . Kidney dialysis status [id number]  . HLD (hyperlipidemia)   . Diabetes mellitus, type II   . Seizure   . BPH (benign prostatic hyperplasia)   . COPD (chronic obstructive pulmonary disease)   . PVD (peripheral vascular disease)   . Carotid stenosis   . Adenoma of large intestine   . ESRD on hemodialysis     MWF  . Anxiety   . RLS (restless legs syndrome)    Past Surgical History  Procedure Laterality Date  . Shoulder arthroscopy w/ rotator cuff repair Left 1999  . Neck surgery N/A 10/24/2009    Cervical Diskectomy- C3-4, 4-5, 5-6 anterior disckectomy by Dr. Arnoldo Morale at [facility]  . Av fistula placement Left 2 years  . Colonoscopy N/A 06/14/2014    Procedure: COLONOSCOPY;  Surgeon: Hulen Luster, MD;  Location: [facility] ENDOSCOPY;  Service: Gastroenterology;  Laterality: N/A;  . Carotid  endarterectomy Right 06/05/2013    Dr. Delana Meyer  . Tonsillectomy and adenoidectomy  1950  . 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 Repeat in 3 years  . Carotid doppler ultrasound  04/22/2013    85-90% stenosis of the RICA, 60-70% stenosis of left cmmon carotid and origin of left subclavian  . Doppler echocardiography  12/04/2011    Bilateral stenosis 80%  . Arch aortogram  04/07/2013    85-90% Stenosis origin of right ICA. 65-70% narrowing of left CCA. 50-60% narrowing of left subclavian artery   Social History   Social History  . Marital Status: Widowed    Spouse Name: N/A  . Number of Children: N/A  . Years of Education: N/A   Occupational History  . Disabled    Social History Main Topics  . Smoking status: Former Smoker -- 2.00 packs/day for 30 years    Types: Cigarettes    Quit date: 01/29/2002  . Smokeless tobacco: Current User  . Alcohol Use: No  . Drug Use: No  . Sexual Activity: Not Asked   Other Topics Concern  . None   Social History Narrative   Family History  Problem Relation Age of Onset  . Stroke Mother   . Prostate cancer Father   . Heart disease Sister    Allergies  Allergen Reactions  . Sulfur Hives and Rash   Prior to Admission medications   Medication Sig Start Date End Date Taking? Authorizing Provider  allopurinol (  ZYLOPRIM) 100 MG tablet Take 100 mg by mouth daily.   Yes Historical Provider, MD  ALPRAZolam Duanne Moron) 0.5 MG tablet Take 1 tablet (0.5 mg total) by mouth 2 (two) times daily. 08/31/14  Yes Birdie Sons, MD  amLODipine (NORVASC) 10 MG tablet Take 10 mg by mouth daily.   Yes Historical Provider, MD  amoxicillin-clavulanate (AUGMENTIN) 875-125 MG per tablet Take 1 tablet by mouth 2 (two) times daily. 09/28/14  Yes Birdie Sons, MD  atorvastatin (LIPITOR) 40 MG tablet Take 40 mg by mouth daily at 6 PM.   Yes Historical Provider, MD  azelastine (ASTELIN) 0.1 % nasal spray Place 2  sprays into both nostrils daily. Use in each nostril as directed   Yes Historical Provider, MD  clarithromycin (BIAXIN) 500 MG tablet Take 1 tablet (500 mg total) by mouth 2 (two) times daily. 09/29/14  Yes Richard Maceo Pro., MD  esomeprazole (NEXIUM) 40 MG capsule Take 1 capsule (40 mg total) by mouth 2 (two) times daily. 09/06/14  Yes Birdie Sons, MD  furosemide (LASIX) 40 MG tablet Take 80 mg by mouth daily. On dialysis days   Yes Historical Provider, MD  indomethacin (INDOCIN) 50 MG capsule Take 50 mg by mouth 2 (two) times daily with a meal.   Yes Historical Provider, MD  lidocaine-prilocaine (EMLA) cream Apply 1 application topically as needed (before dialysis).   Yes Historical Provider, MD  loratadine (CLARITIN) 10 MG tablet Take 10 mg by mouth every other day.   Yes Historical Provider, MD  losartan (COZAAR) 100 MG tablet Take 100 mg by mouth daily.   Yes Historical Provider, MD  meclizine (ANTIVERT) 25 MG tablet Take 25 mg by mouth 2 (two) times daily as needed for dizziness.   Yes Historical Provider, MD  Multiple Vitamin (MULTIVITAMIN WITH MINERALS) TABS tablet Take 1 tablet by mouth daily.   Yes Historical Provider, MD  ranitidine (ZANTAC) 150 MG tablet Take 150 mg by mouth at bedtime.   Yes Historical Provider, MD  rOPINIRole (REQUIP) 1 MG tablet Take 1 mg by mouth at bedtime.   Yes Historical Provider, MD  acetaminophen (TYLENOL) 325 MG tablet Take 650 mg by mouth every 6 (six) hours as needed for mild pain or headache.    Historical Provider, MD  diphenhydrAMINE (BENADRYL) 25 MG tablet Take 25 mg by mouth at bedtime as needed for itching or sleep.    Historical Provider, MD  Ipratropium-Albuterol (COMBIVENT IN) Inhale 2 puffs into the lungs 3 (three) times daily as needed (SOB).    Historical Provider, MD   Mr Shoulder Right Wo Contrast  10/01/2014   CLINICAL DATA:  Severe right shoulder pain and decreased range of motion chronically, worsening over the past several days.  EXAM:  MRI OF THE RIGHT SHOULDER WITHOUT CONTRAST  TECHNIQUE: Multiplanar, multisequence MR imaging of the shoulder was performed. No intravenous contrast was administered.  COMPARISON:  Shoulder radiographs 09/30/2014  FINDINGS: Limited examination due to patient motion.  Rotator cuff: Large full-thickness retracted rotator cuff tear. The supraspinatus and infraspinatus tendons are completely torn and the upper fibers of the subscapularis tendon are torn.  Muscles: Fatty atrophy of the supraspinatus and infraspinatus muscles.  Biceps long head:  Not visualized and likely torn and retracted.  Acromioclavicular Joint: Evidence of prior decompressive surgery with acromioplasty and distal clavicle resection.  Glenohumeral Joint: Large glenohumeral joint effusion, advanced degenerative changes and moderate synovitis.  Labrum:  Poorly visualized.  Likely degenerated and torn.  Bones:  No acute bony findings.  IMPRESSION: 1. Limited examination due to patient motion. 2. Large full-thickness retracted rotator cuff tear. 3. Torn and retracted long head biceps tendon. 4. Large joint effusion and large amount of fluid in the subacromial/subdeltoid bursa. 5. Evidence of previous surgical bony decompressive surgery. 6. Glenohumeral joint degenerative changes.   Electronically Signed   By: Marijo Sanes M.D.   On: 10/01/2014 09:16    Positive ROS: All other systems have been reviewed and were otherwise negative with the exception of those mentioned in the HPI and as above.  Physical Exam: General:  Alert, no acute distress Psychiatric:  Patient is competent for consent with normal mood and affect   Cardiovascular:  No pedal edema Respiratory:  No wheezing, non-labored breathing GI:  Abdomen is soft and non-tender Skin:  No lesions in the area of chief complaint Neurologic:  Sensation intact distally Lymphatic:  No axillary or cervical lymphadenopathy  Orthopedic Exam:  Orthopedic examination is limited to his right  shoulder. Skin inspection around the shoulders unremarkable. He has no tenderness to palpation around the shoulder. Active range of motion is limited as he can forward flex to 40, abduct to 30, and internally rotate to his right buttock. He can also reach across to his opposite shoulder and reach the back of his neck, but has difficulty even reaching the top of his head. Passively, he can be abducted beyond 90 and forward flexed beyond 110, although with some discomfort. He has 4/5 strength with resisted internal rotation and 2+-3/5 strength with resisted external rotation. He has 2-2+/5 strength with resisted abduction and resisted forward flexion. He is neurovascularly intact to the right upper extremity.  X-rays:  A recent MRI scan of the right shoulder has been obtained and is available for review. The report, the MRI scan demonstrates a massive rotator cuff tear with complete involvement of the supraspinatus and infraspinatus tendons with retraction back to the glenoid and significant atrophy of both muscles. There also is partial tearing of the subscapularis tendon superiorly. The long head of the biceps tendon has ruptured and retracted distally. This has allowed the humeral head to migrate superiorly to abut the undersurface of the acromion. Early degenerative changes also are noted. Both the films and report were reviewed by myself and discussed with the patient.  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  Plan: The treatment options were discussed with the patient. The patient is adamant about avoiding surgical intervention, which would be his most reliable treatment option, specifically a reverse right total shoulder arthroplasty. The patient also was offered but declined a steroid injection, stating that he has had one in the past which did not provide any benefit, "in fact it made it worse". We also discussed doing nothing, simply taking  medications and living with it, or physical therapy. The patient would be open to a course of physical therapy for his shoulder after discharge "if they'll pay for it". This could be arranged by either his primary care provider or myself once he is stabilized medically and discharged.  Thank you for asking up despite in the care of this most pleasant unfortunate man. Please have him set up an appointment to see me in the office if he would like to pursue further treatment for his right shoulder symptoms.   Pascal Lux, MD  Beeper #:  [id number]  10/02/2014 10:38 AM

## 2014-10-02 NOTE — Clinical Social Work Placement (Signed)
   CLINICAL SOCIAL WORK PLACEMENT  NOTE  Date:  10/02/2014  Patient Details  Name: Troy Gallagher MRN: [id number] Date of Birth: December 05, 1945  Clinical Social Work is seeking post-discharge placement for this patient at the [place] level of care (*CSW will initial, date and re-position this form in  chart as items are completed):  Yes   Patient/family provided with [place] Work Department's list of facilities offering this level of care within the geographic area requested by the patient (or if unable, by the patient's family).  Yes   Patient/family informed of their freedom to choose among providers that offer the needed level of care, that participate in Medicare, Medicaid or managed care program needed by the patient, have an available bed and are willing to accept the patient.  Yes   Patient/family informed of [facility]'s ownership interest in [facility] and [facility], as well as of the fact that they are under no obligation to receive care at these facilities.  PASRR submitted to EDS on 10/02/14     PASRR number received on 10/02/14     Existing PASRR number confirmed on       FL2 transmitted to all facilities in geographic area requested by pt/family on 10/02/14     FL2 transmitted to all facilities within larger geographic area on       Patient informed that his/her managed care company has contracts with or will negotiate with certain facilities, including the following:            Patient/family informed of bed offers received.  Patient chooses bed at       Physician recommends and patient chooses bed at      Patient to be transferred to   on  .  Patient to be transferred to facility by       Patient family notified on   of transfer.  Name of family member notified:        PHYSICIAN Please sign FL2     Additional Comment:    _______________________________________________ Loralyn Freshwater, LCSW 10/02/2014, 3:25  PM

## 2014-10-02 NOTE — Progress Notes (Signed)
[facility] [place] NAME: Troy Gallagher    MR#:  [id number]  DATE OF BIRTH:  02/26/1945  SUBJECTIVE:  CHIEF COMPLAINT:   Chief Complaint  Patient presents with  . Weakness   -Very nonspecific complaints. Admitted for weakness. Troponin barely positive. -Patient complains of significant right shoulder pain and decreased range of motion going on for a month, worsening over the past few days , denies any significant physical activity precipitating the pain. Denies any trauma. Cannot take care of himself. Try to use the bedpan but not able to roll in the bed due to significant pains in the shoulder and weakness -Missed dialysis day on admission. Will have hemodialysis today via AV fistula on in the left upper arm. - Not a great historian.  Feels a little bit more comfortable today, less pain in the right shoulder. Refuses surgical intervention for rotator cuff injury in the right shoulder, refused injections. Cardiologist did not feel any further intervention is needed with conservative management. Physical therapist is to see patient today  For Discharge planning REVIEW OF SYSTEMS:  Review of Systems  Constitutional: Negative for fever and chills.  HENT: Negative for ear discharge and ear pain.   Eyes: Negative for blurred vision.  Respiratory: Negative for cough, shortness of breath and wheezing.   Cardiovascular: Negative for chest pain and palpitations.  Gastrointestinal: Negative for heartburn, nausea, vomiting, abdominal pain, diarrhea and constipation.  Genitourinary: Negative for dysuria.  Musculoskeletal: Positive for myalgias, back pain and joint pain.  Neurological: Positive for weakness. Negative for dizziness, sensory change, speech change, seizures and headaches.    DRUG ALLERGIES:   Allergies  Allergen Reactions  . Sulfur Hives and Rash    VITALS:  Blood pressure 131/55, pulse 66, temperature 98.3 F (36.8 C),  temperature source Oral, resp. rate 16, height 5\' 9"  (1.753 m), weight 104.6 kg (230 lb 9.6 oz), SpO2 95 %.  PHYSICAL EXAMINATION:  Physical Exam  GENERAL:  69 y.o.-year-old patient lying in the bed in moderate distress, significant pain in the right shoulder. Not able to move around in the bed, not able to use, but then. Right shoulder pain on palpation and passive movements.  EYES: Pupils equal, round, reactive to light and accommodation. No scleral icterus. Extraocular muscles intact.  HEENT: Head atraumatic, normocephalic. Oropharynx and nasopharynx clear.  NECK:  Supple, no jugular venous distention. No thyroid enlargement, no tenderness.  LUNGS: Normal breath sounds bilaterally, no wheezing, rales,rhonchi or crepitation. No use of accessory muscles of respiration. Decreased bibasilar breath sounds. CARDIOVASCULAR: S1, S2 normal. No rubs, or gallops. 3/6 systolic murmur. ABDOMEN: Soft, nontender, nondistended. Bowel sounds present. No organomegaly or mass.  EXTREMITIES: No pedal edema, cyanosis, or clubbing.  Significant pain of the right shoulder on movement especially in the posterior part. Adduction and internal rotation are fine especially abduction and flexion, extension are limited NEUROLOGIC: Cranial nerves II through XII are intact. Muscle strength 5/5 in all extremities except the right upper extremity, limited secondary to pain.. Sensation intact. Gait not checked.  PSYCHIATRIC: The patient is alert and oriented x 3.  SKIN: No obvious rash, lesion, or ulcer.    LABORATORY PANEL:   CBC  Recent Labs Lab 09/30/14 1633  WBC 8.8  HGB 9.5*  HCT 29.6*  PLT 316   ------------------------------------------------------------------------------------------------------------------  Chemistries   Recent Labs Lab 09/30/14 0005 09/30/14 0516 09/30/14 1633  NA 139 139 138  K 3.9 3.6 4.1  CL 101 102  101  CO2 26 23 21*  GLUCOSE 102* 92 71  BUN 64* 67* 65*  CREATININE 8.38*  8.57*  8.61* 8.56*  CALCIUM 9.4 9.3 9.6  MG  --  1.9  --   AST 18  --   --   ALT 12*  --   --   ALKPHOS 66  --   --   BILITOT 0.3  --   --    ------------------------------------------------------------------------------------------------------------------  Cardiac Enzymes  Recent Labs Lab 09/30/14 1110  TROPONINI 0.03   ------------------------------------------------------------------------------------------------------------------  RADIOLOGY:  Mr Shoulder Right Wo Contrast  10/01/2014   CLINICAL DATA:  Severe right shoulder pain and decreased range of motion chronically, worsening over the past several days.  EXAM: MRI OF THE RIGHT SHOULDER WITHOUT CONTRAST  TECHNIQUE: Multiplanar, multisequence MR imaging of the shoulder was performed. No intravenous contrast was administered.  COMPARISON:  Shoulder radiographs 09/30/2014  FINDINGS: Limited examination due to patient motion.  Rotator cuff: Large full-thickness retracted rotator cuff tear. The supraspinatus and infraspinatus tendons are completely torn and the upper fibers of the subscapularis tendon are torn.  Muscles: Fatty atrophy of the supraspinatus and infraspinatus muscles.  Biceps long head:  Not visualized and likely torn and retracted.  Acromioclavicular Joint: Evidence of prior decompressive surgery with acromioplasty and distal clavicle resection.  Glenohumeral Joint: Large glenohumeral joint effusion, advanced degenerative changes and moderate synovitis.  Labrum:  Poorly visualized.  Likely degenerated and torn.  Bones:  No acute bony findings.  IMPRESSION: 1. Limited examination due to patient motion. 2. Large full-thickness retracted rotator cuff tear. 3. Torn and retracted long head biceps tendon. 4. Large joint effusion and large amount of fluid in the subacromial/subdeltoid bursa. 5. Evidence of previous surgical bony decompressive surgery. 6. Glenohumeral joint degenerative changes.   Electronically Signed   By: Marijo Sanes M.D.   On: 10/01/2014 09:16    EKG:   Orders placed or performed during the hospital encounter of 09/29/14  . EKG 12-Lead  . EKG 12-Lead  . EKG 12-Lead  . EKG 12-Lead    ASSESSMENT AND PLAN:   69 year old male with past medical history significant for end-stage renal disease on Monday Wednesday Friday hemodialysis, hypertension, GERD, history of CVA, diabetes presents to the hospital with generalized weakness, missing hemodialysis.  #1 generalized weakness and right shoulder pain-no infection noted. -Physical therapy consult is ordered. However, patient has significant discomfort and pain on movements. I'm afraid that he will not do well at home and in fact may deteriorate, including possible falls. - Lives at home with his daughter. Would benefit from skilled nursing facility placement. Physical therapist is to reevaluate patient daily to make decisions about placement versus home health physical therapy.   #2 right shoulder pain and limited range of motion due to large full-thickness retracted rotator cuff tear, question recent-denies any trauma. Appreciate orthopedist surgeon consultation, unfortunately, patient refused any surgical procedures , continue pain management and physical therapy  -X-rays did not show any acute dislocation or fractures.  #3 Elevated troponin- no chest pain, likely from his ESRD -  . Appreciate cardiologist input. No further intervention was recommended  #4 ESRD on HD- Mon-wed--Fri schedule, missed hemodialysis on admission, that is supposed to hemodialysis actions in the hospital with improvement of overall condition. Suspect partially uremic reasons of patient's generalized weakness - Nephrology consult is appreciated  #5 HTN- elevated BP, on norvasc and losartan - added PO hydralazine, blood pressure has improved significantly - on hydralazine and labetelol prn  #  6 DVT prophylaxis-subcutaneous heparin.  All the records are reviewed and  case discussed with Care Management/Social Workerr. Management plans discussed with the patient, family and they are in agreement.  CODE STATUS: Full code  TOTAL TIME TAKING CARE OF THIS PATIENT: 40 minutes.   . Discussed with Dr. Richardson Chiquito M.D on 10/02/2014 at 1:02 PM  Between 7am to 6pm - Pager - [phone]  After 6pm go to [email or web address] - password [email or web address] [facility]  Office  [phone]  CC: Primary care physician; Lelon Huh, MD

## 2014-10-03 DIAGNOSIS — M12811 Other specific arthropathies, not elsewhere classified, right shoulder: Secondary | ICD-10-CM

## 2014-10-03 DIAGNOSIS — M75101 Unspecified rotator cuff tear or rupture of right shoulder, not specified as traumatic: Secondary | ICD-10-CM

## 2014-10-03 MED ORDER — SEVELAMER CARBONATE 800 MG PO TABS: 1600.0000 mg | ORAL_TABLET | Freq: Three times a day (TID) | ORAL | Status: AC

## 2014-10-03 MED ORDER — TRAMADOL-ACETAMINOPHEN 37.5-325 MG PO TABS: 1.0000 | ORAL_TABLET | Freq: Four times a day (QID) | ORAL | Status: DC | PRN

## 2014-10-03 MED ORDER — CYCLOSPORINE 0.05 % OP EMUL: 1.0000 [drp] | Freq: Two times a day (BID) | OPHTHALMIC | Status: AC

## 2014-10-03 MED ORDER — LABETALOL HCL 100 MG PO TABS: 100.0000 mg | ORAL_TABLET | Freq: Two times a day (BID) | ORAL | Status: DC

## 2014-10-03 NOTE — Progress Notes (Signed)
[facility]  ROUNDING NOTE   Subjective:   MRI of shoulder shows rotator cuff tear - patient not amendable to surgical intervention.   Objective:  Vital signs in last 24 hours:  Temp:  [98.3 F (36.8 C)-98.9 F (37.2 C)] 98.9 F (37.2 C) (09/04 0436) Pulse Rate:  [66-73] 73 (09/04 0436) Resp:  [16-19] 19 (09/04 0436) BP: (131-177)/(55-62) 156/60 mmHg (09/04 0436) SpO2:  [90 %-96 %] 96 % (09/04 0436)  Weight change:  Filed Weights   09/30/14 1722 10/01/14 1415 10/01/14 1745  Weight: 103.42 kg (228 lb) 105.3 kg (232 lb 2.3 oz) 104.6 kg (230 lb 9.6 oz)    Intake/Output: I/O last 3 completed shifts: In: 120 [P.O.:120] Out: 250 [Urine:250]   Intake/Output this shift:  Total I/O In: 240 [P.O.:240] Out: -   Physical Exam: General: NAD, laying in bed  Head: Normocephalic, atraumatic. Moist oral mucosal membranes  Eyes: Anicteric, PERRL  Neck: Supple, trachea midline  Lungs:  Clear to auscultation normal effort  Heart: Regular rate and rhythm  Abdomen:  Soft, nontender, BS present  Extremities:  no peripheral edema.  Neurologic: Nonfocal, moving all four extremities  Skin: No lesions  GU Condom catheter with urine  Access: LUE AVF    Basic Metabolic Panel:  Recent Labs Lab 09/30/14 0005 09/30/14 0516 09/30/14 1633  NA 139 139 138  K 3.9 3.6 4.1  CL 101 102 101  CO2 26 23 21*  GLUCOSE 102* 92 71  BUN 64* 67* 65*  CREATININE 8.38* 8.57*  8.61* 8.56*  CALCIUM 9.4 9.3 9.6  MG  --  1.9  --   PHOS  --  6.5* 5.5*    Liver Function Tests:  Recent Labs Lab 09/30/14 0005 09/30/14 1633  AST 18  --   ALT 12*  --   ALKPHOS 66  --   BILITOT 0.3  --   PROT 7.6  --   ALBUMIN 3.4* 3.1*   No results for input(s): LIPASE, AMYLASE in the last 168 hours. No results for input(s): AMMONIA in the last 168 hours.  CBC:  Recent Labs Lab 09/30/14 0516 09/30/14 1633  WBC 8.8 8.8  HGB 10.1* 9.5*  HCT 30.3* 29.6*  MCV 94.9 95.0  PLT 319 316     Cardiac Enzymes:  Recent Labs Lab 09/30/14 0005 09/30/14 0516 09/30/14 1110  TROPONINI 0.04* 0.04* 0.03    BNP: Invalid input(s): POCBNP  CBG: No results for input(s): GLUCAP in the last 168 hours.  Microbiology: Results for orders placed or performed during the hospital encounter of 10/24/09  Surgical pcr screen     Status: None   Collection Time: 10/19/09  2:01 PM  Result Value Ref Range Status   MRSA, PCR NEGATIVE NEGATIVE Final   Staphylococcus aureus  NEGATIVE Final    NEGATIVE        The Xpert SA Assay (FDA approved for NASAL specimens only), is one component of a comprehensive surveillance program.  It is not intended to diagnose infection nor to guide or monitor treatment.    Coagulation Studies: No results for input(s): LABPROT, INR in the last 72 hours.  Urinalysis: No results for input(s): COLORURINE, LABSPEC, PHURINE, GLUCOSEU, HGBUR, BILIRUBINUR, KETONESUR, PROTEINUR, UROBILINOGEN, NITRITE, LEUKOCYTESUR in the last 72 hours.  Invalid input(s): APPERANCEUR    Imaging: No results found.   Medications:     . ALPRAZolam  0.5 mg Oral BID  . amLODipine  10 mg Oral Daily  . atorvastatin  40 mg  Oral q1800  . cycloSPORINE  1 drop Both Eyes BID  . [START ON 10/04/2014] epoetin (EPOGEN/PROCRIT) injection  10,000 Units Intravenous Q M,W,F-HD  . furosemide  40 mg Oral BID  . heparin  5,000 Units Subcutaneous 3 times per day  . hydrALAZINE  50 mg Oral TID  . losartan  100 mg Oral Daily  . pantoprazole  40 mg Oral QAC breakfast  . rOPINIRole  3 mg Oral QHS   sodium chloride, sodium chloride, acetaminophen **OR** acetaminophen, alteplase, heparin, hydrALAZINE, HYDROcodone-acetaminophen, ipratropium-albuterol, labetalol, lidocaine (PF), lidocaine-prilocaine, ondansetron **OR** ondansetron (ZOFRAN) IV, pentafluoroprop-tetrafluoroeth, senna-docusate, sodium chloride  Assessment/ Plan:  69 y.o. male  with end-stage renal disease on hemodialysis Monday,  Wednesday, Friday, hypertension, COPD, GERD, gout, left upper extremity AV fistula, restless leg syndrome, hyperlipidemia who was admitted for generalized weakness.  1.  End-stage renal disease on hemodialysis Monday, Wednesday, Friday. Hemodialysis Friday, tolerated well. UF of 1 litre - plan on next treatment for Monday.   2. Anemia chronic kidney disease.  Hgb 9.5, epogen 10000 units IV with HD.   3.  Secondary hyperparathyroidism. Phos down to 5.5. PTH as outpatient of 439.  - not currently on binders. On renvela with meals as outpatient - gets sensipar  4.  HTN:  Not well controlled - continue amlodipine, furosemide, losartan. - starting labetolol    LOS:  Mekenzie Modeste 9/4/201610:10 AM

## 2014-10-03 NOTE — Progress Notes (Signed)
Pt discharged home, IV site discontinued, bleeding controlled, condom cath already off, pt bathed before DC, tele monitor turned in, all DC instructions given, understanding verbalized, trmamdol scrip given to pt,  Pt left hospital in car with his family

## 2014-10-03 NOTE — Discharge Summary (Addendum)
[facility] [place] NAME: Troy Gallagher    MR#:  [id number]  DATE OF BIRTH:  1945/06/09  DATE OF ADMISSION:  09/29/2014 ADMITTING PHYSICIAN: Lance Coon, MD  DATE OF DISCHARGE: No discharge date for patient encounter.  PRIMARY CARE PHYSICIAN: Lelon Huh, MD     ADMISSION DIAGNOSIS:  Weakness  DISCHARGE DIAGNOSIS:  Principal Problem:   Weakness generalized Active Problems:   Generalized weakness   Rotator cuff tear arthropathy of right shoulder   Elevated troponin   ESRD on hemodialysis   GERD (gastroesophageal reflux disease)   HTN (hypertension)   HLD (hyperlipidemia)   Type 2 diabetes mellitus   Anxiety   RLS (restless legs syndrome)   SECONDARY DIAGNOSIS:   Past Medical History  Diagnosis Date  . Complication of anesthesia   . Hypertension   . Stroke   . GERD (gastroesophageal reflux disease)   . Headache   . Kidney dialysis status [id number]  . HLD (hyperlipidemia)   . Diabetes mellitus, type II   . Seizure   . BPH (benign prostatic hyperplasia)   . COPD (chronic obstructive pulmonary disease)   . PVD (peripheral vascular disease)   . Carotid stenosis   . Adenoma of large intestine   . ESRD on hemodialysis     MWF  . Anxiety   . RLS (restless legs syndrome)     .pro HOSPITAL COURSE:    the patient is 69 year old Caucasian male with past medical history significant for history of end-stage renal disease on hemodialysis who presents to the hospital with generalized weakness and was noted to have elevated troponin to 0.04 2.  There were no chest pains associated with generalized weakness.  Patient's EKG was unremarkable issues. Lab studies otherwise were also unremarkable.  Patient was admitted to the hospital for further evaluation.  On admission he was complaining of right shoulder pain.  X-ray of right shoulder done on 09/30/2014 showed no evidence of fracture or dislocation.   CT of head as well as  cervical spine revealed no acute traumatic injury status post a ACDF of at  H9-Q2 without complication. MRI of the shoulder was performed  And it revealed large full-thickness retracted rotator cuff tear torn and retracted long head of biceps tendon large joint effusion and large amount of fluid in subacromial subdeltoid bursa.  Patient was consulted by orthopedist surgeon however, refused all interventions,  including injections or surgery.  Physical therapy was recommended and he was agreeable.  Patient was seen by physical therapist and recommended rehabilitation placement, which he also refused.  We discussed this patient extensively risks of returning back home however, he was adamant and we  Are discharging him home with home health physical therapy as well as nurse.  Of note,  in regards to end-stage renal disease. Patient was seen by nephrologist and continued hemodialysis while he was in the hospital.  Patient was noted to have significant hypertension while in the hospital requiring addition of new medications.   discussion by problem #1 generalized weakness and right shoulder pain-no infection noted. -Physical therapy consult is ordered.  Physical therapist recommended rehabilitation placement, which patient denied we will be discharging him home with home health physical therapy.  Discussed this patient and family as well as Management extensively - Lives at home with his daughter. .   #2 right shoulder pain and limited range of motion due to large full-thickness retracted rotator cuff tear, denies any trauma. Appreciate orthopedist surgeon  consultation, unfortunately, patient refused any surgical procedures , continue pain management and physical therapy  At home  -X-rays did not show any acute dislocation or fractures.  #3 Elevated troponin- no chest pain, likely from his ESRD - . Appreciate cardiologist input. No further intervention was recommended  #4 ESRD on HD- Mon-wed--Fri schedule,  missed hemodialysis  beforeadmission,  The patient did have few hemodialysis  Sessions whilein the hospital with improvement of overall condition. Suspect partially uremic reasons of patient's generalized weakness - Nephrology consult is appreciated  #5 HTN-  Poorly controlled despite  norvasc and losartan,  Adding labetalol discussed with Dr. Juleen China   DISCHARGE CONDITIONS:    stable  CONSULTS OBTAINED:  Treatment Team:  Anthonette Legato, MD Corky Mull, MD Isaias Cowman, MD  DRUG ALLERGIES:   Allergies  Allergen Reactions  . Sulfur Hives and Rash    DISCHARGE MEDICATIONS:   Current Discharge Medication List    START taking these medications   Details  labetalol (NORMODYNE) 100 MG tablet Take 1 tablet (100 mg total) by mouth 2 (two) times daily. Qty: 60 tablet, Refills: 6    sevelamer carbonate (RENVELA) 800 MG tablet Take 2 tablets (1,600 mg total) by mouth 3 (three) times daily with meals. Qty: 90 tablet, Refills: 6    traMADol-acetaminophen (ULTRACET) 37.5-325 MG per tablet Take 1 tablet by mouth every 6 (six) hours as needed. Qty: 40 tablet, Refills: 0      CONTINUE these medications which have CHANGED   Details  cycloSPORINE (RESTASIS) 0.05 % ophthalmic emulsion Place 1 drop into both eyes 2 (two) times daily. Qty: 0.4 mL, Refills: 3      CONTINUE these medications which have NOT CHANGED   Details  allopurinol (ZYLOPRIM) 100 MG tablet Take 100 mg by mouth daily.    ALPRAZolam (XANAX) 0.5 MG tablet Take 1 tablet (0.5 mg total) by mouth 2 (two) times daily. Qty: 180 tablet, Refills: 1    amLODipine (NORVASC) 10 MG tablet Take 10 mg by mouth daily.    atorvastatin (LIPITOR) 40 MG tablet Take 40 mg by mouth daily at 6 PM.    azelastine (ASTELIN) 0.1 % nasal spray Place 2 sprays into both nostrils daily. Use in each nostril as directed    esomeprazole (NEXIUM) 40 MG capsule Take 1 capsule (40 mg total) by mouth 2 (two) times daily. Qty: 60 capsule,  Refills: 6    furosemide (LASIX) 40 MG tablet Take 80 mg by mouth daily. On dialysis days    indomethacin (INDOCIN) 50 MG capsule Take 50 mg by mouth 2 (two) times daily with a meal.    lidocaine-prilocaine (EMLA) cream Apply 1 application topically as needed (before dialysis).    loratadine (CLARITIN) 10 MG tablet Take 10 mg by mouth every other day.    losartan (COZAAR) 100 MG tablet Take 100 mg by mouth daily.    meclizine (ANTIVERT) 25 MG tablet Take 25 mg by mouth 2 (two) times daily as needed for dizziness.    Multiple Vitamin (MULTIVITAMIN WITH MINERALS) TABS tablet Take 1 tablet by mouth daily.    ranitidine (ZANTAC) 150 MG tablet Take 150 mg by mouth at bedtime.    rOPINIRole (REQUIP) 1 MG tablet Take 1 mg by mouth at bedtime.    acetaminophen (TYLENOL) 325 MG tablet Take 650 mg by mouth every 6 (six) hours as needed for mild pain or headache.    diphenhydrAMINE (BENADRYL) 25 MG tablet Take 25 mg by mouth at bedtime as  needed for itching or sleep.    Ipratropium-Albuterol (COMBIVENT IN) Inhale 2 puffs into the lungs 3 (three) times daily as needed (SOB).      STOP taking these medications     amoxicillin-clavulanate (AUGMENTIN) 875-125 MG per tablet      clarithromycin (BIAXIN) 500 MG tablet          DISCHARGE INSTRUCTIONS:     patient is to follow-up with Dr. Caryn Section  In the next few days after discharge, hemodialysis as previously scheduled he is to be followed by home health physical therapy and RN  If you experience worsening of your admission symptoms, develop shortness of breath, life threatening emergency, suicidal or homicidal thoughts you must seek medical attention immediately by calling 911 or calling your MD immediately  if symptoms less severe.  You Must read complete instructions/literature along with all the possible adverse reactions/side effects for all the Medicines you take and that have been prescribed to you. Take any new Medicines after you  have completely understood and accept all the possible adverse reactions/side effects.   Please note  You were cared for by a hospitalist during your hospital stay. If you have any questions about your discharge medications or the care you received while you were in the hospital after you are discharged, you can call the unit and asked to speak with the hospitalist on call if the hospitalist that took care of you is not available. Once you are discharged, your primary care physician will handle any further medical issues. Please note that NO REFILLS for any discharge medications will be authorized once you are discharged, as it is imperative that you return to your primary care physician (or establish a relationship with a primary care physician if you do not have one) for your aftercare needs so that they can reassess your need for medications and monitor your lab values.    Today   CHIEF COMPLAINT:   Chief Complaint  Patient presents with  . Weakness    HISTORY OF PRESENT ILLNESS:  Cartier Washko  is a 69 y.o. male with a known history of end-stage renal disease on hemodialysis who presents to the hospital with generalized weakness and was noted to have elevated troponin to 0.04 2.  There were no chest pains associated with generalized weakness.  Patient's EKG was unremarkable issues. Lab studies otherwise were also unremarkable.  Patient was admitted to the hospital for further evaluation.  On admission he was complaining of right shoulder pain.  X-ray of right shoulder done on 09/30/2014 showed no evidence of fracture or dislocation.   CT of head as well as cervical spine revealed no acute traumatic injury status post a ACDF of at  Z3-G6 without complication. MRI of the shoulder was performed  And it revealed large full-thickness retracted rotator cuff tear torn and retracted long head of biceps tendon large joint effusion and large amount of fluid in subacromial subdeltoid bursa.  Patient was  consulted by orthopedist surgeon however, refused all interventions,  including injections or surgery.  Physical therapy was recommended and he was agreeable.  Patient was seen by physical therapist and recommended rehabilitation placement, which he also refused.  We discussed this patient extensively risks of returning back home however, he was adamant and we  Are discharging him home with home health physical therapy as well as nurse.  Of note,  in regards to end-stage renal disease. Patient was seen by nephrologist and continued hemodialysis while he was in the hospital.  Patient was noted to have significant hypertension while in the hospital requiring addition of new medications.   discussion by problem #1 generalized weakness and right shoulder pain-no infection noted. -Physical therapy consult is ordered.  Physical therapist recommended rehabilitation placement, which patient denied we will be discharging him home with home health physical therapy.  Discussed this patient and family as well as Management extensively - Lives at home with his daughter. .   #2 right shoulder pain and limited range of motion due to large full-thickness retracted rotator cuff tear, denies any trauma. Appreciate orthopedist surgeon consultation, unfortunately, patient refused any surgical procedures , continue pain management and physical therapy  At home  -X-rays did not show any acute dislocation or fractures.  #3 Elevated troponin- no chest pain, likely from his ESRD - . Appreciate cardiologist input. No further intervention was recommended  #4 ESRD on HD- Mon-wed--Fri schedule, missed hemodialysis  beforeadmission,  The patient did have few hemodialysis  Sessions whilein the hospital with improvement of overall condition. Suspect partially uremic reasons of patient's generalized weakness - Nephrology consult is appreciated  #5 HTN-  Poorly controlled despite  norvasc and losartan,  Adding labetalol discussed  with Dr. Juleen China     VITAL SIGNS:  Blood pressure 161/57, pulse 72, temperature 97.8 F (36.6 C), temperature source Oral, resp. rate 16, height 5\' 9"  (1.753 m), weight 104.6 kg (230 lb 9.6 oz), SpO2 100 %.  I/O:    Intake/Output Summary (Last 24 hours) at 10/03/14 1216 Last data filed at 10/03/14 0953  Gross per 24 hour  Intake    360 ml  Output    150 ml  Net    210 ml    PHYSICAL EXAMINATION:  GENERAL:  69 y.o.-year-old patient lying in the bed with no acute distress.  EYES: Pupils equal, round, reactive to light and accommodation. No scleral icterus. Extraocular muscles intact.  HEENT: Head atraumatic, normocephalic. Oropharynx and nasopharynx clear.  NECK:  Supple, no jugular venous distention. No thyroid enlargement, no tenderness.  LUNGS: Normal breath sounds bilaterally, no wheezing, rales,rhonchi or crepitation. No use of accessory muscles of respiration.  CARDIOVASCULAR: S1, S2 normal. No murmurs, rubs, or gallops.  ABDOMEN: Soft, non-tender, non-distended. Bowel sounds present. No organomegaly or mass.  EXTREMITIES: No pedal edema, cyanosis, or clubbing.  NEUROLOGIC: Cranial nerves II through XII are intact. Muscle strength 5/5 in all extremities. Sensation intact. Gait not checked.  PSYCHIATRIC: The patient is alert and oriented x 3.  SKIN: No obvious rash, lesion, or ulcer.   DATA REVIEW:   CBC  Recent Labs Lab 09/30/14 1633  WBC 8.8  HGB 9.5*  HCT 29.6*  PLT 316    Chemistries   Recent Labs Lab 09/30/14 0005 09/30/14 0516 09/30/14 1633  NA 139 139 138  K 3.9 3.6 4.1  CL 101 102 101  CO2 26 23 21*  GLUCOSE 102* 92 71  BUN 64* 67* 65*  CREATININE 8.38* 8.57*  8.61* 8.56*  CALCIUM 9.4 9.3 9.6  MG  --  1.9  --   AST 18  --   --   ALT 12*  --   --   ALKPHOS 66  --   --   BILITOT 0.3  --   --     Cardiac Enzymes  Recent Labs Lab 09/30/14 1110  TROPONINI 0.03    Microbiology Results  Results for orders placed or performed during the  hospital encounter of 10/24/09  Surgical pcr screen  Status: None   Collection Time: 10/19/09  2:01 PM  Result Value Ref Range Status   MRSA, PCR NEGATIVE NEGATIVE Final   Staphylococcus aureus  NEGATIVE Final    NEGATIVE        The Xpert SA Assay (FDA approved for NASAL specimens only), is one component of a comprehensive surveillance program.  It is not intended to diagnose infection nor to guide or monitor treatment.    RADIOLOGY:  No results found.  EKG:   Orders placed or performed during the hospital encounter of 09/29/14  . EKG 12-Lead  . EKG 12-Lead  . EKG 12-Lead  . EKG 12-Lead      Management plans discussed with the patient, family and they are in agreement.  CODE STATUS:     Code Status Orders        Start     Ordered   09/30/14 0455  Full code   Continuous     09/30/14 0454      TOTAL TIME TAKING CARE OF THIS PATIENT:  40 minutes.    Theodoro Grist M.D on 10/03/2014 at 12:16 PM  Between 7am to 6pm - Pager - [phone]  After 6pm go to [email or web address] - password [email or web address] [facility]  Office  [phone]  CC: Primary care physician; Lelon Huh, MD

## 2014-10-03 NOTE — Care Management Note (Addendum)
Case Management Note  Patient Details  Name: Troy Gallagher MRN: [id number] Date of Birth: March 07, 1945  Subjective/Objective:   Discussed discharge planning with Dr Ether Griffins this morning. [name] is being discharged home with home health RN and PT which he was already receiving through [facility] prior to this admission. Discharge information and MD orders for home health were faxed to [facility]. .                 Action/Plan:   Expected Discharge Date:                  Expected Discharge Plan:     In-House Referral:     Discharge planning Services     Post Acute Care Choice:    Choice offered to:     DME Arranged:    DME Agency:     HH Arranged:    [place] Agency:     Status of Service:     Medicare Important Message Given:    Date Medicare IM Given:    Medicare IM give by:    Date Additional Medicare IM Given:    Additional Medicare Important Message give by:     If discussed at [place] of Stay Meetings, dates discussed:    Additional Comments:  Ceira Hoeschen A, RN 10/03/2014, 10:45 AM

## 2014-10-04 ENCOUNTER — Emergency Department: Payer: Medicare Other

## 2014-10-04 ENCOUNTER — Inpatient Hospital Stay
Admission: EM | Admit: 2014-10-04 | Discharge: 2014-10-07 | DRG: 064 | Disposition: A | Discharge status: Skilled Nursing Facility | Payer: Medicare Other | Attending: Internal Medicine | Admitting: Internal Medicine

## 2014-10-04 ENCOUNTER — Other Ambulatory Visit: Payer: Self-pay

## 2014-10-04 ENCOUNTER — Encounter: Payer: Self-pay | Admitting: Intensive Care

## 2014-10-04 DIAGNOSIS — D649 Anemia, unspecified: Secondary | ICD-10-CM | POA: Diagnosis present

## 2014-10-04 DIAGNOSIS — N186 End stage renal disease: Secondary | ICD-10-CM | POA: Diagnosis present

## 2014-10-04 DIAGNOSIS — Z8249 Family history of ischemic heart disease and other diseases of the circulatory system: Secondary | ICD-10-CM | POA: Diagnosis not present

## 2014-10-04 DIAGNOSIS — F17210 Nicotine dependence, cigarettes, uncomplicated: Secondary | ICD-10-CM | POA: Diagnosis present

## 2014-10-04 DIAGNOSIS — M109 Gout, unspecified: Secondary | ICD-10-CM | POA: Diagnosis present

## 2014-10-04 DIAGNOSIS — Z882 Allergy status to sulfonamides status: Secondary | ICD-10-CM

## 2014-10-04 DIAGNOSIS — J449 Chronic obstructive pulmonary disease, unspecified: Secondary | ICD-10-CM | POA: Diagnosis present

## 2014-10-04 DIAGNOSIS — E785 Hyperlipidemia, unspecified: Secondary | ICD-10-CM | POA: Diagnosis present

## 2014-10-04 DIAGNOSIS — I63512 Cerebral infarction due to unspecified occlusion or stenosis of left middle cerebral artery: Secondary | ICD-10-CM | POA: Diagnosis present

## 2014-10-04 DIAGNOSIS — M129 Arthropathy, unspecified: Secondary | ICD-10-CM | POA: Diagnosis present

## 2014-10-04 DIAGNOSIS — E119 Type 2 diabetes mellitus without complications: Secondary | ICD-10-CM | POA: Diagnosis present

## 2014-10-04 DIAGNOSIS — F419 Anxiety disorder, unspecified: Secondary | ICD-10-CM | POA: Diagnosis present

## 2014-10-04 DIAGNOSIS — Z8042 Family history of malignant neoplasm of prostate: Secondary | ICD-10-CM | POA: Diagnosis not present

## 2014-10-04 DIAGNOSIS — Z9889 Other specified postprocedural states: Secondary | ICD-10-CM

## 2014-10-04 DIAGNOSIS — K219 Gastro-esophageal reflux disease without esophagitis: Secondary | ICD-10-CM | POA: Diagnosis present

## 2014-10-04 DIAGNOSIS — G2581 Restless legs syndrome: Secondary | ICD-10-CM | POA: Diagnosis present

## 2014-10-04 DIAGNOSIS — G451 Carotid artery syndrome (hemispheric): Secondary | ICD-10-CM | POA: Diagnosis not present

## 2014-10-04 DIAGNOSIS — Z79899 Other long term (current) drug therapy: Secondary | ICD-10-CM

## 2014-10-04 DIAGNOSIS — I739 Peripheral vascular disease, unspecified: Secondary | ICD-10-CM | POA: Diagnosis present

## 2014-10-04 DIAGNOSIS — I639 Cerebral infarction, unspecified: Secondary | ICD-10-CM | POA: Diagnosis not present

## 2014-10-04 DIAGNOSIS — Z992 Dependence on renal dialysis: Secondary | ICD-10-CM | POA: Diagnosis not present

## 2014-10-04 DIAGNOSIS — N2581 Secondary hyperparathyroidism of renal origin: Secondary | ICD-10-CM | POA: Diagnosis present

## 2014-10-04 DIAGNOSIS — Z823 Family history of stroke: Secondary | ICD-10-CM | POA: Diagnosis not present

## 2014-10-04 DIAGNOSIS — G459 Transient cerebral ischemic attack, unspecified: Secondary | ICD-10-CM | POA: Diagnosis present

## 2014-10-04 DIAGNOSIS — I771 Stricture of artery: Secondary | ICD-10-CM | POA: Diagnosis present

## 2014-10-04 DIAGNOSIS — I120 Hypertensive chronic kidney disease with stage 5 chronic kidney disease or end stage renal disease: Secondary | ICD-10-CM | POA: Diagnosis present

## 2014-10-04 DIAGNOSIS — M12811 Other specific arthropathies, not elsewhere classified, right shoulder: Secondary | ICD-10-CM | POA: Diagnosis present

## 2014-10-04 DIAGNOSIS — M75101 Unspecified rotator cuff tear or rupture of right shoulder, not specified as traumatic: Secondary | ICD-10-CM

## 2014-10-04 DIAGNOSIS — I10 Essential (primary) hypertension: Secondary | ICD-10-CM | POA: Diagnosis present

## 2014-10-04 DIAGNOSIS — I6522 Occlusion and stenosis of left carotid artery: Secondary | ICD-10-CM

## 2014-10-04 LAB — CBC WITH DIFFERENTIAL/PLATELET
Basophils Absolute: 0.1 10*3/uL (ref 0–0.1)
Basophils Relative: 1 %
EOS PCT: 1 %
Eosinophils Absolute: 0.1 10*3/uL (ref 0–0.7)
HCT: 30.3 % — ABNORMAL LOW (ref 40.0–52.0)
Hemoglobin: 9.9 g/dL — ABNORMAL LOW (ref 13.0–18.0)
LYMPHS ABS: 0.8 10*3/uL — AB (ref 1.0–3.6)
LYMPHS PCT: 9 %
MCH: 31.2 pg (ref 26.0–34.0)
MCHC: 32.7 g/dL (ref 32.0–36.0)
MCV: 95.4 fL (ref 80.0–100.0)
MONO ABS: 0.5 10*3/uL (ref 0.2–1.0)
MONOS PCT: 6 %
Neutro Abs: 7.9 10*3/uL — ABNORMAL HIGH (ref 1.4–6.5)
Neutrophils Relative %: 85 %
PLATELETS: 345 10*3/uL (ref 150–440)
RBC: 3.17 MIL/uL — ABNORMAL LOW (ref 4.40–5.90)
RDW: 15 % — AB (ref 11.5–14.5)
WBC: 9.4 10*3/uL (ref 3.8–10.6)

## 2014-10-04 LAB — COMPREHENSIVE METABOLIC PANEL
ALK PHOS: 65 U/L (ref 38–126)
ALT: 18 U/L (ref 17–63)
ANION GAP: 13 (ref 5–15)
AST: 20 U/L (ref 15–41)
Albumin: 3.3 g/dL — ABNORMAL LOW (ref 3.5–5.0)
BILIRUBIN TOTAL: 0.4 mg/dL (ref 0.3–1.2)
BUN: 69 mg/dL — ABNORMAL HIGH (ref 6–20)
CALCIUM: 9.7 mg/dL (ref 8.9–10.3)
CO2: 23 mmol/L (ref 22–32)
Chloride: 97 mmol/L — ABNORMAL LOW (ref 101–111)
Creatinine, Ser: 7.89 mg/dL — ABNORMAL HIGH (ref 0.61–1.24)
GFR calc non Af Amer: 6 mL/min — ABNORMAL LOW (ref >60)
GFR, EST AFRICAN AMERICAN: 7 mL/min — AB (ref >60)
Glucose, Bld: 112 mg/dL — ABNORMAL HIGH (ref 65–99)
Potassium: 4.3 mmol/L (ref 3.5–5.1)
SODIUM: 133 mmol/L — AB (ref 135–145)
TOTAL PROTEIN: 7.6 g/dL (ref 6.5–8.1)

## 2014-10-04 LAB — ETHANOL: Alcohol, Ethyl (B): <5 mg/dL (ref <5)

## 2014-10-04 LAB — URINALYSIS COMPLETE WITH MICROSCOPIC (ARMC ONLY)
BILIRUBIN URINE: NEGATIVE
Bacteria, UA: NONE SEEN
Glucose, UA: 50 mg/dL — AB
Hgb urine dipstick: NEGATIVE
KETONES UR: NEGATIVE mg/dL
Leukocytes, UA: NEGATIVE
NITRITE: NEGATIVE
PH: 7 (ref 5.0–8.0)
SPECIFIC GRAVITY, URINE: 1.015 (ref 1.005–1.030)
Squamous Epithelial / LPF: NONE SEEN

## 2014-10-04 LAB — URINE DRUG SCREEN, QUALITATIVE (ARMC ONLY)
AMPHETAMINES, UR SCREEN: NOT DETECTED
BENZODIAZEPINE, UR SCRN: POSITIVE — AB
Barbiturates, Ur Screen: NOT DETECTED
Cannabinoid 50 Ng, Ur ~~LOC~~: NOT DETECTED
Cocaine Metabolite,Ur ~~LOC~~: NOT DETECTED
MDMA (ECSTASY) UR SCREEN: NOT DETECTED
Methadone Scn, Ur: NOT DETECTED
Opiate, Ur Screen: POSITIVE — AB
Phencyclidine (PCP) Ur S: NOT DETECTED
TRICYCLIC, UR SCREEN: NOT DETECTED

## 2014-10-04 LAB — PROTIME-INR
INR: 1.06
Prothrombin Time: 14 seconds (ref 11.4–15.0)

## 2014-10-04 LAB — TYPE AND SCREEN
ABO/RH(D): A NEG
ANTIBODY SCREEN: NEGATIVE

## 2014-10-04 LAB — ABO/RH: ABO/RH(D): A NEG

## 2014-10-04 LAB — TROPONIN I: Troponin I: 0.03 ng/mL (ref <0.031)

## 2014-10-04 LAB — GLUCOSE, CAPILLARY: Glucose-Capillary: 136 mg/dL — ABNORMAL HIGH (ref 65–99)

## 2014-10-04 MED ORDER — STROKE: EARLY STAGES OF RECOVERY BOOK
Freq: Once | Status: AC
  Administered 2014-10-05: 01:00:00

## 2014-10-04 MED ORDER — IPRATROPIUM-ALBUTEROL 0.5-2.5 (3) MG/3ML IN SOLN: 3.0000 mL | RESPIRATORY_TRACT | Status: DC | PRN

## 2014-10-04 MED ORDER — PANTOPRAZOLE SODIUM 40 MG PO TBEC
40.0000 mg | DELAYED_RELEASE_TABLET | Freq: Two times a day (BID) | ORAL | Status: DC
  Administered 2014-10-05 – 2014-10-07 (×5): 40 mg via ORAL
  Filled 2014-10-04 (×5): qty 1

## 2014-10-04 MED ORDER — SEVELAMER CARBONATE 800 MG PO TABS
1600.0000 mg | ORAL_TABLET | Freq: Three times a day (TID) | ORAL | Status: DC
  Administered 2014-10-05 – 2014-10-07 (×8): 1600 mg via ORAL
  Filled 2014-10-04 (×8): qty 2

## 2014-10-04 MED ORDER — ATORVASTATIN CALCIUM 20 MG PO TABS
40.0000 mg | ORAL_TABLET | Freq: Every evening | ORAL | Status: DC
  Administered 2014-10-05 – 2014-10-06 (×2): 40 mg via ORAL
  Filled 2014-10-04 (×2): qty 2

## 2014-10-04 MED ORDER — SODIUM CHLORIDE 0.9 % IV SOLN
INTRAVENOUS | Status: DC
  Administered 2014-10-05: via INTRAVENOUS

## 2014-10-04 MED ORDER — CINACALCET HCL 30 MG PO TABS
30.0000 mg | ORAL_TABLET | Freq: Every evening | ORAL | Status: DC
  Administered 2014-10-05 – 2014-10-06 (×2): 30 mg via ORAL
  Filled 2014-10-04 (×3): qty 1

## 2014-10-04 MED ORDER — ALPRAZOLAM 0.5 MG PO TABS
0.5000 mg | ORAL_TABLET | Freq: Every day | ORAL | Status: DC
  Administered 2014-10-05 – 2014-10-06 (×3): 0.5 mg via ORAL
  Filled 2014-10-04 (×3): qty 1

## 2014-10-04 MED ORDER — ONDANSETRON HCL 4 MG/2ML IJ SOLN: 4.0000 mg | Freq: Four times a day (QID) | INTRAMUSCULAR | Status: DC | PRN

## 2014-10-04 MED ORDER — ACETAMINOPHEN 325 MG PO TABS
650.0000 mg | ORAL_TABLET | Freq: Four times a day (QID) | ORAL | Status: DC | PRN
  Administered 2014-10-06 – 2014-10-07 (×2): 650 mg via ORAL
  Filled 2014-10-04 (×2): qty 2

## 2014-10-04 MED ORDER — ASPIRIN 81 MG PO CHEW
324.0000 mg | CHEWABLE_TABLET | Freq: Once | ORAL | Status: AC
  Administered 2014-10-04: 324 mg via ORAL
  Filled 2014-10-04: qty 4

## 2014-10-04 MED ORDER — ACETAMINOPHEN 650 MG RE SUPP: 650.0000 mg | Freq: Four times a day (QID) | RECTAL | Status: DC | PRN

## 2014-10-04 MED ORDER — HEPARIN SODIUM (PORCINE) 5000 UNIT/ML IJ SOLN
5000.0000 [IU] | Freq: Three times a day (TID) | INTRAMUSCULAR | Status: DC
  Administered 2014-10-05 – 2014-10-06 (×6): 5000 [IU] via SUBCUTANEOUS
  Filled 2014-10-04 (×6): qty 1

## 2014-10-04 MED ORDER — CYCLOSPORINE 0.05 % OP EMUL
1.0000 [drp] | Freq: Two times a day (BID) | OPHTHALMIC | Status: DC
  Administered 2014-10-05 – 2014-10-07 (×6): 1 [drp] via OPHTHALMIC
  Filled 2014-10-04 (×7): qty 1

## 2014-10-04 MED ORDER — IPRATROPIUM-ALBUTEROL 18-103 MCG/ACT IN AERO: 2.0000 | INHALATION_SPRAY | RESPIRATORY_TRACT | Status: DC | PRN

## 2014-10-04 MED ORDER — ONDANSETRON HCL 4 MG PO TABS
4.0000 mg | ORAL_TABLET | Freq: Four times a day (QID) | ORAL | Status: DC | PRN
Start: 2014-10-04 — End: 2014-10-07

## 2014-10-04 MED ORDER — AMLODIPINE BESYLATE 10 MG PO TABS: 10.0000 mg | ORAL_TABLET | Freq: Every day | ORAL | Status: DC

## 2014-10-04 MED ORDER — INSULIN ASPART 100 UNIT/ML ~~LOC~~ SOLN: 0.0000 [IU] | Freq: Three times a day (TID) | SUBCUTANEOUS | Status: DC

## 2014-10-04 MED ORDER — ROPINIROLE HCL 1 MG PO TABS
2.0000 mg | ORAL_TABLET | Freq: Every day | ORAL | Status: DC
  Administered 2014-10-05 – 2014-10-06 (×3): 2 mg via ORAL
  Filled 2014-10-04 (×4): qty 2

## 2014-10-04 MED ORDER — LOSARTAN POTASSIUM 25 MG PO TABS: 25.0000 mg | ORAL_TABLET | Freq: Every evening | ORAL | Status: DC

## 2014-10-04 NOTE — H&P (Signed)
[facility] [place] NAME: Troy Gallagher    MR#:  [id number]  DATE OF BIRTH:  February 22, 1945  DATE OF ADMISSION:  10/04/2014  PRIMARY CARE PHYSICIAN: Lelon Huh, MD   REQUESTING/REFERRING PHYSICIAN: Karma Greaser, MD  CHIEF COMPLAINT:   Chief Complaint  Patient presents with  . Cerebrovascular Accident    HISTORY OF PRESENT ILLNESS:  Troy Gallagher  is a 69 y.o. male who presents with complaint of right hemiparesis. Patient was just discharged from the hospital where he had been admitted for elevated troponin and generalized weakness. 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 Patient's daughter states that he developed an alteration from his baseline mood and functioning during the 24 hours prior to admission. She states that "he just wasn't acting right". Upon clarification she states that he was acting somewhat more confused, and that he was slower to respond, did not verbalize things that he needed or wanted this well, and then the afternoon prior to admission he started to develop right upper extremity and more so right lower extremity focal weakness as well as some right facial droop for some period of time. The symptoms persisted through to his evaluation in the ED by ED physician, but resolved during his time in the ED. In the ED largely benign including CT head with no acute abnormality. Hospitalists were called for admission for TIA.  PAST MEDICAL HISTORY:   Past Medical History  Diagnosis Date  . Complication of anesthesia   . Hypertension   . Stroke   . GERD (gastroesophageal reflux disease)   . Headache   . Kidney dialysis status [id number]  . HLD (hyperlipidemia)   . Diabetes mellitus, type II   . Seizure   . BPH (benign prostatic hyperplasia)   . COPD (chronic obstructive pulmonary disease)   . PVD (peripheral vascular disease)   . Carotid stenosis   . Adenoma of large intestine    . ESRD on hemodialysis     MWF  . Anxiety   . RLS (restless legs syndrome)     PAST SURGICAL HISTORY:   Past Surgical History  Procedure Laterality Date  . Shoulder arthroscopy w/ rotator cuff repair Left 1999  . Neck surgery N/A 10/24/2009    Cervical Diskectomy- C3-4, 4-5, 5-6 anterior disckectomy by Dr. Arnoldo Morale at [facility]  . Av fistula placement Left 2 years  . Colonoscopy N/A 06/14/2014    Procedure: COLONOSCOPY;  Surgeon: Hulen Luster, MD;  Location: [facility] ENDOSCOPY;  Service: Gastroenterology;  Laterality: N/A;  . Carotid endarterectomy Right 06/05/2013    Dr. Delana Meyer  . Tonsillectomy and adenoidectomy  1950  . 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 Repeat in 3 years  . Carotid doppler ultrasound  04/22/2013    85-90% stenosis of the RICA, 60-70% stenosis of left cmmon carotid and origin of left subclavian  . Doppler echocardiography  12/04/2011    Bilateral stenosis 80%  . Arch aortogram  04/07/2013    85-90% Stenosis origin of right ICA. 65-70% narrowing of left CCA. 50-60% narrowing of left subclavian artery    SOCIAL HISTORY:   Social History  Substance Use Topics  . Smoking status: Former Smoker -- 2.00 packs/day for 30 years    Types: Cigarettes    Quit date: 01/29/2002  . Smokeless tobacco: Current User  . Alcohol Use: No  FAMILY HISTORY:   Family History  Problem Relation Age of Onset  . Stroke Mother   . Prostate cancer Father   . Heart disease Sister     DRUG ALLERGIES:   Allergies  Allergen Reactions  . Sulfur Hives    MEDICATIONS AT HOME:   Prior to Admission medications   Medication Sig Start Date End Date Taking? Authorizing Provider  acetaminophen (TYLENOL) 325 MG tablet Take 650 mg by mouth every 6 (six) hours as needed for mild pain or headache.   Yes Historical Provider, MD  albuterol-ipratropium (COMBIVENT) 18-103 MCG/ACT inhaler Inhale 2 puffs into the lungs every 4  (four) hours as needed for wheezing or shortness of breath.   Yes Historical Provider, MD  allopurinol (ZYLOPRIM) 100 MG tablet Take 100 mg by mouth every evening.    Yes Historical Provider, MD  ALPRAZolam Duanne Moron) 0.5 MG tablet Take 1 tablet (0.5 mg total) by mouth 2 (two) times daily. Patient taking differently: Take 0.5 mg by mouth at bedtime.  08/31/14  Yes Birdie Sons, MD  amLODipine (NORVASC) 10 MG tablet Take 10 mg by mouth daily. Pt does not take on dialysis days.   Yes Historical Provider, MD  amoxicillin-clavulanate (AUGMENTIN) 875-125 MG per tablet Take 1 tablet by mouth 2 (two) times daily. 09/28/14 10/08/14 Yes Historical Provider, MD  atorvastatin (LIPITOR) 40 MG tablet Take 40 mg by mouth every evening.    Yes Historical Provider, MD  azelastine (ASTELIN) 0.1 % nasal spray Place 2 sprays into both nostrils daily as needed for rhinitis.    Yes Historical Provider, MD  cinacalcet (SENSIPAR) 30 MG tablet Take 30 mg by mouth every evening.   Yes Historical Provider, MD  cycloSPORINE (RESTASIS) 0.05 % ophthalmic emulsion Place 1 drop into both eyes 2 (two) times daily. 10/03/14  Yes Theodoro Grist, MD  diphenhydrAMINE (BENADRYL) 25 MG tablet Take 25 mg by mouth at bedtime as needed for itching or sleep.   Yes Historical Provider, MD  esomeprazole (NEXIUM) 40 MG capsule Take 1 capsule (40 mg total) by mouth 2 (two) times daily. 09/06/14  Yes Birdie Sons, MD  furosemide (LASIX) 80 MG tablet Take 80 mg by mouth daily. Pt does not take on dialysis days.   Yes Historical Provider, MD  indomethacin (INDOCIN) 50 MG capsule Take 50 mg by mouth 2 (two) times daily as needed for mild pain.    Yes Historical Provider, MD  lidocaine-prilocaine (EMLA) cream Apply 1 application topically as needed (before port access).    Yes Historical Provider, MD  loratadine (CLARITIN) 10 MG tablet Take 10 mg by mouth daily as needed for allergies.    Yes Historical Provider, MD  losartan (COZAAR) 25 MG tablet Take 25  mg by mouth every evening.   Yes Historical Provider, MD  meclizine (ANTIVERT) 25 MG tablet Take 25 mg by mouth 2 (two) times daily as needed for dizziness.   Yes Historical Provider, MD  potassium chloride SA (K-DUR,KLOR-CON) 20 MEQ tablet Take 20 mEq by mouth daily.   Yes Historical Provider, MD  rOPINIRole (REQUIP) 2 MG tablet Take 2 mg by mouth at bedtime.   Yes Historical Provider, MD  labetalol (NORMODYNE) 100 MG tablet Take 1 tablet (100 mg total) by mouth 2 (two) times daily. 10/03/14   Theodoro Grist, MD  sevelamer carbonate (RENVELA) 800 MG tablet Take 2 tablets (1,600 mg total) by mouth 3 (three) times daily with meals. 10/03/14   Theodoro Grist, MD  traMADol-acetaminophen (ULTRACET) 37.5-325  MG per tablet Take 1 tablet by mouth every 6 (six) hours as needed. Patient taking differently: Take 1 tablet by mouth every 6 (six) hours as needed for moderate pain.  10/03/14   Theodoro Grist, MD    REVIEW OF SYSTEMS:  Review of Systems  Constitutional: Positive for malaise/fatigue. Negative for fever, chills and weight loss.  HENT: Negative for ear pain, hearing loss and tinnitus.   Eyes: Negative for blurred vision, double vision, pain and redness.  Respiratory: Negative for cough, hemoptysis and shortness of breath.   Cardiovascular: Negative for chest pain, palpitations, orthopnea and leg swelling.  Gastrointestinal: Negative for nausea, vomiting, abdominal pain, diarrhea and constipation.  Genitourinary: Negative for dysuria, frequency and hematuria.  Musculoskeletal: Negative for back pain, joint pain and neck pain.  Skin:       No acne, rash, or lesions  Neurological: Positive for focal weakness and weakness. Negative for dizziness, tremors, sensory change, speech change and loss of consciousness.  Endo/Heme/Allergies: Negative for polydipsia. Does not bruise/bleed easily.  Psychiatric/Behavioral: Negative for depression. The patient is not nervous/anxious and does not have insomnia.       VITAL SIGNS:   Filed Vitals:   10/04/14 1828 10/04/14 1830 10/04/14 1905 10/04/14 2200  BP:  155/58  158/60  Pulse:  80  85  Temp:  98.7 F (37.1 C) 98.7 F (37.1 C)   TempSrc:  Oral    Resp:  17  20  Height: 5\' 9"  (1.753 m)     Weight: 106.142 kg (234 lb)     SpO2:  96%  99%   Wt Readings from Last 3 Encounters:  10/04/14 106.142 kg (234 lb)  10/01/14 104.6 kg (230 lb 9.6 oz)  09/30/14 106.142 kg (234 lb)    PHYSICAL EXAMINATION:  Physical Exam  Vitals reviewed. Constitutional: He is oriented to person, place, and time. He appears well-developed and well-nourished. No distress.  HENT:  Head: Normocephalic and atraumatic.  Mouth/Throat: Oropharynx is clear and moist.  Eyes: Conjunctivae and EOM are normal. Pupils are equal, round, and reactive to light. No scleral icterus.  Neck: Normal range of motion. Neck supple. No JVD present. No thyromegaly present.  Cardiovascular: Normal rate, regular rhythm and intact distal pulses.  Exam reveals no gallop and no friction rub.   No murmur heard. Respiratory: Effort normal and breath sounds normal. No respiratory distress. He has no wheezes. He has no rales.  GI: Soft. Bowel sounds are normal. He exhibits no distension. There is no tenderness.  Musculoskeletal: Normal range of motion. He exhibits no edema.  No arthritis, no gout  Lymphadenopathy:    He has no cervical adenopathy.  Neurological: He is alert and oriented to person, place, and time.  Neurologic: Cranial nerves II-XII intact, Sensation intact to light touch/pinprick, 5/5 strength in all extremities, no dysarthria, no aphasia, no dysphagia, memory intact, finger to nose testing showed no abnormality on the left, he had difficulty with finger-to-nose testing on the right side due to right shoulder pain, no pronator drift, DTR intact, Babinski sign not present.  Skin: Skin is warm and dry. No rash noted. No erythema.  Psychiatric: He has a normal mood and affect. His  behavior is normal. Judgment and thought content normal.    LABORATORY PANEL:   CBC  Recent Labs Lab 10/04/14 1924  WBC 9.4  HGB 9.9*  HCT 30.3*  PLT 345   ------------------------------------------------------------------------------------------------------------------  Chemistries   Recent Labs Lab 09/30/14 0516  10/04/14 1924  NA  139  < > 133*  K 3.6  < > 4.3  CL 102  < > 97*  CO2 23  < > 23  GLUCOSE 92  < > 112*  BUN 67*  < > 69*  CREATININE 8.57*  8.61*  < > 7.89*  CALCIUM 9.3  < > 9.7  MG 1.9  --   --   AST  --   --  20  ALT  --   --  18  ALKPHOS  --   --  65  BILITOT  --   --  0.4  < > = values in this interval not displayed. ------------------------------------------------------------------------------------------------------------------  Cardiac Enzymes  Recent Labs Lab 10/04/14 1924  TROPONINI 0.03   ------------------------------------------------------------------------------------------------------------------  RADIOLOGY:  Ct Head Wo Contrast  10/04/2014   CLINICAL DATA:  Right-sided facial droop.  Alert and oriented  EXAM: CT HEAD WITHOUT CONTRAST  TECHNIQUE: Contiguous axial images were obtained from the base of the skull through the vertex without intravenous contrast.  COMPARISON:  09/30/2014  FINDINGS: There is no evidence of mass effect, midline shift, or extra-axial fluid collections. There is no evidence of a space-occupying lesion or intracranial hemorrhage. There is no evidence of a cortical-based area of acute infarction. There is generalized cerebral atrophy. There is periventricular white matter low attenuation likely secondary to microangiopathy.  The ventricles and sulci are appropriate for the patient's age. The basal cisterns are patent.  Visualized portions of the orbits are unremarkable. The visualized portions of the paranasal sinuses and mastoid air cells are unremarkable. Cerebrovascular atherosclerotic calcifications are noted.   The osseous structures are unremarkable.  IMPRESSION: 1. No acute intracranial pathology. 2. Chronic microvascular disease and cerebral atrophy.   Electronically Signed   By: Kathreen Devoid   On: 10/04/2014 19:11    EKG:   Orders placed or performed during the hospital encounter of 10/04/14  . EKG 12-Lead  . EKG 12-Lead    IMPRESSION AND PLAN:  Principal Problem:   TIA (transient ischemic attack) - patient has a prior history of stroke, he presented today with symptoms of right hemiparesis which resolved while he was in the ED. Patient was recently taken off of antiplatelet medications due to GI bleed as well as significant bleeding from his fistula during dialysis sessions. We will admit him for TIA/CVA workup. We'll get a MRI/MRA brain/head as well as ultrasound carotid Dopplers. He has a known history of some carotid stenosis. We'll get a neurology consult. Patient was given full strength aspirin in the ED. Active Problems:   ESRD on hemodialysis - consult nephrology for hemodialysis support.   HTN (hypertension) - stable to mildly elevated at this time, continue home meds for this   Type 2 diabetes mellitus - sliding scale insulin with appropriate fingerstick glucose checks every 6 hours while nothing by mouth for now   Anxiety - continue home meds   GERD (gastroesophageal reflux disease) - home meds for this   HLD (hyperlipidemia) - home dose statin   RLS (restless legs syndrome) - continue home meds   Rotator cuff tear arthropathy of right shoulder - contributing to physical exam finding of right upper extremity weakness which is due to pain in his shoulder rather than true neurologic or muscular weakness.  All the records are reviewed and case discussed with ED provider. Management plans discussed with the patient and/or family.  DVT PROPHYLAXIS: SubQ heparin  ADMISSION STATUS: Inpatient  CODE STATUS: Full  TOTAL TIME TAKING CARE OF THIS  PATIENT: 45 minutes.    Kiyoko Mcguirt  FIELDING 10/04/2014, 10:28 PM  [facility]  Office  [phone]  CC: Primary care physician; Lelon Huh, MD

## 2014-10-04 NOTE — ED Provider Notes (Signed)
[facility] Emergency Department Provider Note  ____________________________________________  Time seen: Approximately 7:03 PM  I have reviewed the triage vital signs and the nursing notes.   HISTORY  Chief Complaint Cerebrovascular Accident  Altered Mental Status limited the history  HPI Troy Gallagher is a 69 y.o. male with an extensive past medical history who presents with right-sided facial droop and right arm and leg weakness.  According to the family this started yesterday evening but it was mild.  When I saw him this morning, it was much more pronounced with obvious facial droop and he could not raise his right arm or right leg.  It is since improved significantly but he still has weakness in the right arm and leg.  Additionally,he remains altered from his baseline, not as conversant or able to converse as usual.  He is more confused than normal.  His family is worried about this.  He is a dialysis patient and last had dialysis 3 days ago.  He also had a stroke at some point in the past reportedly due to a carotid occlusion but according to his daughters he is not taking any anticoagulation at this time.  He had a recent hospitalization after a fall and some pain in his right shoulder.  Based on the description of the hospital course he ever different status at that time.  Currently he is "out of it" and able to answer questions but does not offer anything that is not able to provide much history.   Past Medical History  Diagnosis Date  . Complication of anesthesia   . Hypertension   . Stroke   . GERD (gastroesophageal reflux disease)   . Headache   . Kidney dialysis status [id number]  . HLD (hyperlipidemia)   . Diabetes mellitus, type II   . Seizure   . BPH (benign prostatic hyperplasia)   . COPD (chronic obstructive pulmonary disease)   . PVD (peripheral vascular disease)   . Carotid stenosis   . Adenoma of large intestine   . ESRD on  hemodialysis     MWF  . Anxiety   . RLS (restless legs syndrome)     Patient Active Problem List   Diagnosis Date Noted  . Rotator cuff tear arthropathy of right shoulder 10/03/2014  . Generalized weakness 10/01/2014  . Elevated troponin 09/30/2014  . Weakness generalized 09/30/2014  . ESRD on hemodialysis 09/30/2014  . GERD (gastroesophageal reflux disease) 09/30/2014  . HTN (hypertension) 09/30/2014  . HLD (hyperlipidemia) 09/30/2014  . Type 2 diabetes mellitus 09/30/2014  . Anxiety 09/30/2014  . RLS (restless legs syndrome) 09/30/2014  . History of CVA (cerebrovascular accident) 09/28/2014  . Symptoms, musculoskeletal, limb NEC 09/28/2014  . Anemia 09/28/2014  . Hyperlipidemia, mixed 09/28/2014  . Bilateral carotid artery stenosis 12/04/2011  . Depressive disorder 01/29/1998    Past Surgical History  Procedure Laterality Date  . Shoulder arthroscopy w/ rotator cuff repair Left 1999  . Neck surgery N/A 10/24/2009    Cervical Diskectomy- C3-4, 4-5, 5-6 anterior disckectomy by Dr. Arnoldo Morale at [facility]  . Av fistula placement Left 2 years  . Colonoscopy N/A 06/14/2014    Procedure: COLONOSCOPY;  Surgeon: Hulen Luster, MD;  Location: [facility] ENDOSCOPY;  Service: Gastroenterology;  Laterality: N/A;  . Carotid endarterectomy Right 06/05/2013    Dr. Delana Meyer  . Tonsillectomy and adenoidectomy  1950  . 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 Repeat in  3 years  . Carotid doppler ultrasound  04/22/2013    85-90% stenosis of the RICA, 60-70% stenosis of left cmmon carotid and origin of left subclavian  . Doppler echocardiography  12/04/2011    Bilateral stenosis 80%  . Arch aortogram  04/07/2013    85-90% Stenosis origin of right ICA. 65-70% narrowing of left CCA. 50-60% narrowing of left subclavian artery    Current Outpatient Rx  Name  Route  Sig  Dispense  Refill  . acetaminophen (TYLENOL) 325 MG tablet   Oral    Take 650 mg by mouth every 6 (six) hours as needed for mild pain or headache.         . allopurinol (ZYLOPRIM) 100 MG tablet   Oral   Take 100 mg by mouth every evening.          Marland Kitchen ALPRAZolam (XANAX) 0.5 MG tablet   Oral   Take 1 tablet (0.5 mg total) by mouth 2 (two) times daily. Patient taking differently: Take 0.5 mg by mouth at bedtime.    180 tablet   1   . amLODipine (NORVASC) 10 MG tablet   Oral   Take 10 mg by mouth daily.         Marland Kitchen atorvastatin (LIPITOR) 40 MG tablet   Oral   Take 40 mg by mouth every evening.          Marland Kitchen azelastine (ASTELIN) 0.1 % nasal spray   Each Nare   Place 2 sprays into both nostrils daily as needed for rhinitis.          . cycloSPORINE (RESTASIS) 0.05 % ophthalmic emulsion   Both Eyes   Place 1 drop into both eyes 2 (two) times daily.   0.4 mL   3   . esomeprazole (NEXIUM) 40 MG capsule   Oral   Take 1 capsule (40 mg total) by mouth 2 (two) times daily.   60 capsule   6   . meclizine (ANTIVERT) 25 MG tablet   Oral   Take 25 mg by mouth 2 (two) times daily as needed for dizziness.         . diphenhydrAMINE (BENADRYL) 25 MG tablet   Oral   Take 25 mg by mouth at bedtime as needed for itching or sleep.         . furosemide (LASIX) 40 MG tablet   Oral   Take 80 mg by mouth daily. On dialysis days         . indomethacin (INDOCIN) 50 MG capsule   Oral   Take 50 mg by mouth 2 (two) times daily with a meal.         . Ipratropium-Albuterol (COMBIVENT IN)   Inhalation   Inhale 2 puffs into the lungs 3 (three) times daily as needed (SOB).         Marland Kitchen labetalol (NORMODYNE) 100 MG tablet   Oral   Take 1 tablet (100 mg total) by mouth 2 (two) times daily.   60 tablet   6   . lidocaine-prilocaine (EMLA) cream   Topical   Apply 1 application topically as needed (before dialysis).         Marland Kitchen loratadine (CLARITIN) 10 MG tablet   Oral   Take 10 mg by mouth every other day.         . losartan (COZAAR) 100 MG  tablet   Oral   Take 100 mg by mouth daily.         Marland Kitchen  Multiple Vitamin (MULTIVITAMIN WITH MINERALS) TABS tablet   Oral   Take 1 tablet by mouth daily.         . ranitidine (ZANTAC) 150 MG tablet   Oral   Take 150 mg by mouth at bedtime.         Marland Kitchen rOPINIRole (REQUIP) 1 MG tablet   Oral   Take 1 mg by mouth at bedtime.         . sevelamer carbonate (RENVELA) 800 MG tablet   Oral   Take 2 tablets (1,600 mg total) by mouth 3 (three) times daily with meals.   90 tablet   6   . traMADol-acetaminophen (ULTRACET) 37.5-325 MG per tablet   Oral   Take 1 tablet by mouth every 6 (six) hours as needed. Patient taking differently: Take 1 tablet by mouth every 6 (six) hours as needed for moderate pain.    40 tablet   0     Allergies Sulfur  Family History  Problem Relation Age of Onset  . Stroke Mother   . Prostate cancer Father   . Heart disease Sister     Social History Social History  Substance Use Topics  . Smoking status: Former Smoker -- 2.00 packs/day for 30 years    Types: Cigarettes    Quit date: 01/29/2002  . Smokeless tobacco: Current User  . Alcohol Use: No    Review of Systems Unable to obtain reliable review of systems from patient due to altered mental status, but he currently has no complaints.  When specifically asked, he does admit that his right side feels weaker than his left. ____________________________________________   PHYSICAL EXAM:  VITAL SIGNS: ED Triage Vitals  Enc Vitals Group     BP 10/04/14 1830 155/58 mmHg     Pulse Rate 10/04/14 1830 80     Resp 10/04/14 1830 17     Temp 10/04/14 1830 98.7 F (37.1 C)     Temp Source 10/04/14 1830 Oral     SpO2 10/04/14 1830 96 %     Weight 10/04/14 1828 234 lb (106.142 kg)     Height 10/04/14 1828 5\' 9"  (1.753 m)     Head Cir --      Peak Flow --      Pain Score --      Pain Loc --      Pain Edu? --      Excl. in [place]? --     Constitutional: Alert.  Has the appearance of chronic  illness.  No acute distress. Eyes: Conjunctivae are normal. PERRL. EOMI. no nystagmus Head: Atraumatic. Nose: No congestion/rhinnorhea. Mouth/Throat: Mucous membranes are moist.  Oropharynx non-erythematous. Neck: No stridor.  No cervical spine tenderness to palpation. Cardiovascular: Normal rate, regular rhythm. Grossly normal heart sounds.  Good peripheral circulation. Respiratory: Normal respiratory effort.  No retractions. Lungs CTAB. Gastrointestinal: Soft and nontender. No distention. No abdominal bruits. No CVA tenderness. Musculoskeletal: No lower extremity tenderness nor edema.  No joint effusions. Neurologic:  See below for NIH stroke scale.  The patient has mild right-sided facial droop when smiling.  Otherwise he has no obvious cranial nerve deficits.  He has occasional twitches and jerks apparently new for him.  His right arm is slightly weak against gravity but his right leg is significantly weak against gravity with immediate drift when he is raising it. Skin:  Skin is warm, dry and intact. No rash noted. Psychiatric: Mood and affect are flat. Speech and behavior are normal.  ____________________________________________   LABS (all labs ordered are listed, but only abnormal results are displayed)  Labs Reviewed  COMPREHENSIVE METABOLIC PANEL - Abnormal; Notable for the following:    Sodium 133 (*)    Chloride 97 (*)    Glucose, Bld 112 (*)    BUN 69 (*)    Creatinine, Ser 7.89 (*)    Albumin 3.3 (*)    GFR calc non Af Amer 6 (*)    GFR calc Af Amer 7 (*)    All other components within normal limits  CBC WITH DIFFERENTIAL/PLATELET - Abnormal; Notable for the following:    RBC 3.17 (*)    Hemoglobin 9.9 (*)    HCT 30.3 (*)    RDW 15.0 (*)    Neutro Abs 7.9 (*)    Lymphs Abs 0.8 (*)    All other components within normal limits  URINE DRUG SCREEN, QUALITATIVE ([facility] ONLY) - Abnormal; Notable for the following:    Opiate, Ur Screen POSITIVE (*)    Benzodiazepine, Ur  Scrn POSITIVE (*)    All other components within normal limits  URINALYSIS COMPLETEWITH MICROSCOPIC ([facility] ONLY) - Abnormal; Notable for the following:    Color, Urine YELLOW (*)    APPearance CLEAR (*)    Glucose, UA 50 (*)    Protein, ur >500 (*)    All other components within normal limits  ETHANOL  TROPONIN I  PROTIME-INR  TYPE AND SCREEN  ABO/RH   ____________________________________________  EKG  ED ECG REPORT I, Elajah Kunsman, the attending physician, personally viewed and interpreted this ECG.  Date: 10/04/2014 EKG Time: 18:00 Rate: 79 Rhythm: normal sinus rhythm QRS Axis: normal Intervals: normal ST/T Wave abnormalities: normal Conduction Disutrbances: none Narrative Interpretation: Tall T waves in lead V2, otherwise unremarkable  ____________________________________________  RADIOLOGY   Ct Head Wo Contrast  10/04/2014   CLINICAL DATA:  Right-sided facial droop.  Alert and oriented  EXAM: CT HEAD WITHOUT CONTRAST  TECHNIQUE: Contiguous axial images were obtained from the base of the skull through the vertex without intravenous contrast.  COMPARISON:  09/30/2014  FINDINGS: There is no evidence of mass effect, midline shift, or extra-axial fluid collections. There is no evidence of a space-occupying lesion or intracranial hemorrhage. There is no evidence of a cortical-based area of acute infarction. There is generalized cerebral atrophy. There is periventricular white matter low attenuation likely secondary to microangiopathy.  The ventricles and sulci are appropriate for the patient's age. The basal cisterns are patent.  Visualized portions of the orbits are unremarkable. The visualized portions of the paranasal sinuses and mastoid air cells are unremarkable. Cerebrovascular atherosclerotic calcifications are noted.  The osseous structures are unremarkable.  IMPRESSION: 1. No acute intracranial pathology. 2. Chronic microvascular disease and cerebral atrophy.    Electronically Signed   By: Kathreen Devoid   On: 10/04/2014 19:11    ____________________________________________   PROCEDURES  Procedure(s) performed: None  Critical Care performed: No  NIH Stroke Scale  Interval: Baseline Time: 7:03 PM Person Administering Scale: Mattilynn Forrer  Administer stroke scale items in the order listed. Record performance in each category after each subscale exam. Do not go back and change scores. Follow directions provided for each exam technique. Scores should reflect what the patient does, not what the clinician thinks the patient can do. The clinician should record answers while administering the exam and work quickly. Except where indicated, the patient should not be coached (i.e., repeated requests to patient to make a special effort).  1a  Level of consciousness: 1=not alert but arousable by minor stimulation to obey, answer or respond  1b. LOC questions:  0=Performs both tasks correctly  1c. LOC commands: 0=Performs both tasks correctly  2.  Best Gaze: 0=normal  3.  Visual: 0=No visual loss  4. Facial Palsy: 1=Minor paralysis (flattened nasolabial fold, asymmetric on smiling)  5a.  Motor left arm: 0=No drift, limb holds 90 (or 45) degrees for full 10 seconds  5b.  Motor right arm: 1=Drift, limb holds 90 (or 45) degrees but drifts down before full 10 seconds: does not hit bed  6a. motor left leg: 0=No drift, limb holds 90 (or 45) degrees for full 10 seconds  6b  Motor right leg:  2=Some effort against gravity, limb cannot get to or maintain (if cured) 90 (or 45) degrees, drifts down to bed, but has some effort against gravity  7. Limb Ataxia: 0=Absent  8.  Sensory: 0=Normal; no sensory loss  9. Best Language:  0=No aphasia, normal  10. Dysarthria: 0=Normal  11. Extinction and Inattention: 0=No abnormality  12. Distal motor function: 0=Normal   Total:   5   THE PATIENT IS NOT A CANDIDATE FOR TPA GIVEN UNKNOWN TIME OF ONSET OF SYMPTOMS (OUTSIDE  THE SAFE WINDOW FOR TPA).  ____________________________________________   INITIAL IMPRESSION / ASSESSMENT AND PLAN / ED COURSE  Pertinent labs & imaging results that were available during my care of the patient were reviewed by me and considered in my medical decision making (see chart for details).  Workup generally unremarkable, head CT non-acute.  However, symptoms are significant and new as of yesterday evening.  I will continue to monitor and await labs.  ----------------------------------------- 9:15 PM on 10/04/2014 -----------------------------------------  Symptoms have continued to improve with improvement of facial droop and weakness of right arm - now only right leg seems persistently weak.  Patient is still altered from baseline.  I will admit for probable TIA.  Discussed with the family, discussed with the hospitalist.  ____________________________________________  FINAL CLINICAL IMPRESSION(S) / ED DIAGNOSES  Final diagnoses:  Transient cerebral ischemia, unspecified transient cerebral ischemia type      NEW MEDICATIONS STARTED DURING THIS VISIT:  New Prescriptions   No medications on file     Hinda Kehr, MD 10/04/14 2116

## 2014-10-04 NOTE — ED Notes (Signed)
Patient arrived by ems from home. Patient was d/c from hospital yesterday around 3pm alert and oriented per family and family noticed symptoms of favoring right side and confusion around 5pm. Family reports right facial droop today. Patient received dialysis M/W/F and did not go to dialysis yesterday due to not being able to get up. FSBS with EMS 137

## 2014-10-05 ENCOUNTER — Inpatient Hospital Stay: Payer: Medicare Other

## 2014-10-05 ENCOUNTER — Inpatient Hospital Stay
Admit: 2014-10-05 | Discharge: 2014-10-05 | Disposition: A | Discharge status: Home / Self Care | Payer: Medicare Other | Attending: Internal Medicine | Admitting: Internal Medicine

## 2014-10-05 DIAGNOSIS — I639 Cerebral infarction, unspecified: Secondary | ICD-10-CM

## 2014-10-05 LAB — BASIC METABOLIC PANEL
Anion gap: 11 (ref 5–15)
BUN: 75 mg/dL — ABNORMAL HIGH (ref 6–20)
CHLORIDE: 105 mmol/L (ref 101–111)
CO2: 23 mmol/L (ref 22–32)
CREATININE: 8.19 mg/dL — AB (ref 0.61–1.24)
Calcium: 9.2 mg/dL (ref 8.9–10.3)
GFR calc non Af Amer: 6 mL/min — ABNORMAL LOW (ref >60)
GFR, EST AFRICAN AMERICAN: 7 mL/min — AB (ref >60)
Glucose, Bld: 93 mg/dL (ref 65–99)
Potassium: 4.2 mmol/L (ref 3.5–5.1)
Sodium: 139 mmol/L (ref 135–145)

## 2014-10-05 LAB — CBC
HCT: 27 % — ABNORMAL LOW (ref 40.0–52.0)
Hemoglobin: 8.8 g/dL — ABNORMAL LOW (ref 13.0–18.0)
MCH: 31 pg (ref 26.0–34.0)
MCHC: 32.6 g/dL (ref 32.0–36.0)
MCV: 95.2 fL (ref 80.0–100.0)
PLATELETS: 311 10*3/uL (ref 150–440)
RBC: 2.84 MIL/uL — AB (ref 4.40–5.90)
RDW: 14.8 % — ABNORMAL HIGH (ref 11.5–14.5)
WBC: 6.5 10*3/uL (ref 3.8–10.6)

## 2014-10-05 LAB — GLUCOSE, CAPILLARY
GLUCOSE-CAPILLARY: 89 mg/dL (ref 65–99)
GLUCOSE-CAPILLARY: 83 mg/dL (ref 65–99)
Glucose-Capillary: 99 mg/dL (ref 65–99)
Glucose-Capillary: 88 mg/dL (ref 65–99)

## 2014-10-05 MED ORDER — INSULIN ASPART 100 UNIT/ML ~~LOC~~ SOLN
0.0000 [IU] | Freq: Four times a day (QID) | SUBCUTANEOUS | Status: DC
  Administered 2014-10-05 – 2014-10-07 (×2): 1 [IU] via SUBCUTANEOUS
  Filled 2014-10-05 (×2): qty 1

## 2014-10-05 MED ORDER — ASPIRIN 325 MG PO TABS
325.0000 mg | ORAL_TABLET | Freq: Every day | ORAL | Status: DC
  Administered 2014-10-05 – 2014-10-07 (×2): 325 mg via ORAL
  Filled 2014-10-05 (×2): qty 1

## 2014-10-05 MED ORDER — DIPHENHYDRAMINE HCL 50 MG/ML IJ SOLN
12.5000 mg | Freq: Once | INTRAMUSCULAR | Status: AC
  Administered 2014-10-05: 12.5 mg via INTRAVENOUS
  Filled 2014-10-05: qty 1

## 2014-10-05 MED ORDER — IOHEXOL 350 MG/ML SOLN
75.0000 mL | Freq: Once | INTRAVENOUS | Status: AC | PRN
  Administered 2014-10-05: 16:00:00 75 mL via INTRAVENOUS

## 2014-10-05 NOTE — Progress Notes (Addendum)
[facility] NAME: Troy Gallagher    MR#:  [id number]  DATE OF BIRTH:  1945/04/17  SUBJECTIVE:  Came in with right UE weaknss  REVIEW OF SYSTEMS:   Review of Systems  Constitutional: Negative for fever, chills and weight loss.  HENT: Negative for ear discharge, ear pain and nosebleeds.   Eyes: Negative for blurred vision, pain and discharge.  Respiratory: Negative for sputum production, shortness of breath, wheezing and stridor.   Cardiovascular: Negative for chest pain, palpitations, orthopnea and PND.  Gastrointestinal: Negative for nausea, vomiting, abdominal pain and diarrhea.  Genitourinary: Negative for urgency and frequency.  Musculoskeletal: Negative for back pain and joint pain.  Neurological: Positive for focal weakness and weakness. Negative for sensory change and speech change.  Psychiatric/Behavioral: Negative for depression. The patient is not nervous/anxious.   All other systems reviewed and are negative.  Tolerating Diet:yes Tolerating PT: rec rehab  DRUG ALLERGIES:   Allergies  Allergen Reactions  . Sulfur Hives    VITALS:  Blood pressure 154/56, pulse 72, temperature 98.3 F (36.8 C), temperature source Oral, resp. rate 20, height 5\' 9"  (1.753 m), weight 107.82 kg (237 lb 11.2 oz), SpO2 96 %.  PHYSICAL EXAMINATION:   Physical Exam  GENERAL:  69 y.o.-year-old patient lying in the bed with no acute distress.  EYES: Pupils equal, round, reactive to light and accommodation. No scleral icterus. Extraocular muscles intact.  HEENT: Head atraumatic, normocephalic. Oropharynx and nasopharynx clear.  NECK:  Supple, no jugular venous distention. No thyroid enlargement, no tenderness.  LUNGS: Normal breath sounds bilaterally, no wheezing, rales, rhonchi. No use of accessory muscles of respiration.  CARDIOVASCULAR: S1, S2 normal. No murmurs, rubs, or gallops.  ABDOMEN: Soft, nontender, nondistended. Bowel  sounds present. No organomegaly or mass.  EXTREMITIES: No cyanosis, clubbing or edema b/l.    NEUROLOGIC: Cranial nerves II through XII are intact. Right UE 3/5 weakness+   PSYCHIATRIC: The patient is alert and oriented x 3.  SKIN: No obvious rash, lesion, or ulcer.    LABORATORY PANEL:   CBC  Recent Labs Lab 10/05/14 0542  WBC 6.5  HGB 8.8*  HCT 27.0*  PLT 311    Chemistries   Recent Labs Lab 09/30/14 0516  10/04/14 1924 10/05/14 0542  NA 139  < > 133* 139  K 3.6  < > 4.3 4.2  CL 102  < > 97* 105  CO2 23  < > 23 23  GLUCOSE 92  < > 112* 93  BUN 67*  < > 69* 75*  CREATININE 8.57*  8.61*  < > 7.89* 8.19*  CALCIUM 9.3  < > 9.7 9.2  MG 1.9  --   --   --   AST  --   --  20  --   ALT  --   --  18  --   ALKPHOS  --   --  65  --   BILITOT  --   --  0.4  --   < > = values in this interval not displayed.  Cardiac Enzymes  Recent Labs Lab 10/04/14 1924  TROPONINI 0.03    RADIOLOGY:  Ct Head Wo Contrast  10/04/2014   CLINICAL DATA:  Right-sided facial droop.  Alert and oriented  EXAM: CT HEAD WITHOUT CONTRAST  TECHNIQUE: Contiguous axial images were obtained from the base of the skull through the vertex without intravenous contrast.  COMPARISON:  09/30/2014  FINDINGS: There is  no evidence of mass effect, midline shift, or extra-axial fluid collections. There is no evidence of a space-occupying lesion or intracranial hemorrhage. There is no evidence of a cortical-based area of acute infarction. There is generalized cerebral atrophy. There is periventricular white matter low attenuation likely secondary to microangiopathy.  The ventricles and sulci are appropriate for the patient's age. The basal cisterns are patent.  Visualized portions of the orbits are unremarkable. The visualized portions of the paranasal sinuses and mastoid air cells are unremarkable. Cerebrovascular atherosclerotic calcifications are noted.  The osseous structures are unremarkable.  IMPRESSION: 1. No  acute intracranial pathology. 2. Chronic microvascular disease and cerebral atrophy.   Electronically Signed   By: Kathreen Devoid   On: 10/04/2014 19:11   Mr Brain Wo Contrast  10/05/2014   CLINICAL DATA:  Right hemiparesis of new onset. 24 hours duration. Mental status changes with confusion.  EXAM: MRI HEAD WITHOUT CONTRAST  MRA HEAD WITHOUT CONTRAST  TECHNIQUE: Multiplanar, multiecho pulse sequences of the brain and surrounding structures were obtained without intravenous contrast. Angiographic images of the head were obtained using MRA technique without contrast.  COMPARISON:  Head CT 10/04/2014  FINDINGS: MRI HEAD FINDINGS  The study suffers from motion degradation. Diffusion imaging shows acute infarction in the left frontal lobe and insular region. Underlying deep white matter also shows involvement. No other areas of acute infarction.  The brainstem and cerebellum are unremarkable. The cerebral hemispheres elsewhere show generalized atrophy with moderate chronic small-vessel ischemic changes throughout the white matter. No evidence of mass lesion, hemorrhage, hydrocephalus or extra-axial collection. No inflammatory sinus disease.  MRA HEAD FINDINGS  Both internal carotid arteries are widely patent into the brain. The anterior and middle cerebral vessels are patent. Because of motion degradation, proximal anterior and middle cerebral vessels are not well evaluated for focal stenosis.  Both vertebral arteries are patent with the right being dominant. No basilar stenosis. Flow is present in both posterior cerebral arteries.  IMPRESSION: Background pattern of brain atrophy and chronic small vessel ischemic change.  Acute infarction in the left frontal lobe, insular region and subinsular white matter.  No major vessel occlusion. Motion degradation precludes evaluation for subtle stenotic disease. No proximal stenosis strongly suspected at this time.   Electronically Signed   By: Nelson Chimes M.D.   On: 10/05/2014  15:32   US Carotid Bilateral  10/05/2014   CLINICAL DATA:  TIA, hypertension, syncope, previous tobacco abuse  EXAM: BILATERAL CAROTID DUPLEX ULTRASOUND  TECHNIQUE: Pearline Cables scale imaging, color Doppler and duplex ultrasound was performed of bilateral carotid and vertebral arteries in the neck.  COMPARISON:  01/19/2014  REVIEW OF SYSTEMS: Quantification of carotid stenosis is based on velocity parameters that correlate the residual internal carotid diameter with NASCET-based stenosis levels, using the diameter of the distal internal carotid lumen as the denominator for stenosis measurement.  The following velocity measurements were obtained:  PEAK SYSTOLIC/END DIASTOLIC  RIGHT  ICA:                     138/21cm/sec  CCA:                     818/2XH/BZJ  SYSTOLIC ICA/CCA RATIO:  0.7  DIASTOLIC ICA/CCA RATIO: 2.5  ECA:                     255cm/sec  LEFT  ICA:  255/18cm/sec  CCA:                     659/93TT/SVX  SYSTOLIC ICA/CCA RATIO:  7.93  DIASTOLIC ICA/CCA RATIO: .3  ECA:                     277cm/sec  FINDINGS: RIGHT CAROTID ARTERY: Circumferential partially calcified plaque in the mid and distal common carotid carotid artery and bulb without high-grade stenosis. Plaque involves the carotid bifurcation and proximal internal and external carotid arteries. No high-grade stenosis. Elevated peak systolic velocities in the proximal ECA. No focal aliasing on color Doppler interrogation.  RIGHT VERTEBRAL ARTERY:  Normal flow direction and waveform.  LEFT CAROTID ARTERY: Eccentric partially calcified plaque in the distal common carotid artery and bulb. Plaque at the origin of the external carotid artery with elevated peak systolic velocities distally. Irregular partially calcified plaque in the proximal ICA with elevated peak systolic velocities and some focal aliasing on color Doppler interrogation. Waveforms in the distal IC unremarkable.  LEFT VERTEBRAL ARTERY: Normal flow direction and waveform.   IMPRESSION: 1. Bilateral carotid bifurcation and proximal ICA plaque, resulting in 50-69% diameter stenosis on the right, 70-99% diameter stenosis on the left. 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 Consider vascular surgical or neurointerventional radiology consultation.   Electronically Signed   By: Lucrezia Europe M.D.   On: 10/05/2014 10:09   Mr Lovenia Kim  10/05/2014   CLINICAL DATA:  Right hemiparesis of new onset. 24 hours duration. Mental status changes with confusion.  EXAM: MRI HEAD WITHOUT CONTRAST  MRA HEAD WITHOUT CONTRAST  TECHNIQUE: Multiplanar, multiecho pulse sequences of the brain and surrounding structures were obtained without intravenous contrast. Angiographic images of the head were obtained using MRA technique without contrast.  COMPARISON:  Head CT 10/04/2014  FINDINGS: MRI HEAD FINDINGS  The study suffers from motion degradation. Diffusion imaging shows acute infarction in the left frontal lobe and insular region. Underlying deep white matter also shows involvement. No other areas of acute infarction.  The brainstem and cerebellum are unremarkable. The cerebral hemispheres elsewhere show generalized atrophy with moderate chronic small-vessel ischemic changes throughout the white matter. No evidence of mass lesion, hemorrhage, hydrocephalus or extra-axial collection. No inflammatory sinus disease.  MRA HEAD FINDINGS  Both internal carotid arteries are widely patent into the brain. The anterior and middle cerebral vessels are patent. Because of motion degradation, proximal anterior and middle cerebral vessels are not well evaluated for focal stenosis.  Both vertebral arteries are patent with the right being dominant. No basilar stenosis. Flow is present in both posterior cerebral arteries.  IMPRESSION: Background pattern of brain atrophy and chronic small vessel ischemic change.  Acute infarction in the left frontal lobe, insular region and subinsular white matter.  No major vessel  occlusion. Motion degradation precludes evaluation for subtle stenotic disease. No proximal stenosis strongly suspected at this time.   Electronically Signed   By: Nelson Chimes M.D.   On: 10/05/2014 15:32   ASSESSMENT AND PLAN:   * Acute CVA -- patient has a prior history of stroke, he presented today with symptoms of right hemiparesis -Patient was recently taken off of antiplatelet medications due to GI bleed as well as significant bleeding from his fistula during dialysis sessions. -continue full strength aspirin  Per Neuro recommendation -MRI brain shows Acute infarction in the left frontal lobe, insular region and subinsular white matter. 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 t underwnet right CEA in the past -CTA neck ordered after d/w dr schnier  * ESRD on hemodialysis - consult nephrology for hemodialysis support.  * HTN (hypertension) - stable to mildly elevated at this time, continue home meds for this -allow permissive HTN  * Type 2 diabetes mellitus - sliding scale insulin with appropriate fingerstick glucose checks every 6 hours while nothing by mouth for now  * Anxiety - continue home meds  * GERD (gastroesophageal reflux disease) - home meds for this  * HLD (hyperlipidemia) - home dose statin  *RLS (restless legs syndrome) - continue home meds  * Rotator cuff tear arthropathy of right shoulder - contributing to physical exam finding of right upper extremity weakness which is due to pain in his shoulder rather than true neurologic or muscular weakness.  Case discussed with Care Management/Social Worker. Management plans discussed with the patient, family and they are in agreement.  CODE STATUS: FULL  DVT Prophylaxis: heparin  TOTAL TIME TAKING CARE OF THIS PATIENT: 35 minutes.  >50% time spent on counselling and coordination of care with pt and Dr schnier  POSSIBLE D/C IN 1-2 DAYS, DEPENDING ON CLINICAL CONDITION.   Meyer Dockery M.D  on 10/05/2014 at 4:30 PM  Between 7am to 6pm - Pager - [phone]  After 6pm go to [email or web address] - password [email or web address] [facility]  Office  [phone]  CC: Primary care physician; Lelon Huh, MD

## 2014-10-05 NOTE — Evaluation (Signed)
Occupational Therapy Evaluation Patient Details Name: DASCHEL ROUGHTON MRN: [id number] DOB: 07/05/1945 Today's Date: 10/05/2014    History of Present Illness This patient  is a 69 year old male who came to [facility]  with complaint of right sided weakness. Patient was just discharged from the hospital where he had been admitted for elevated troponin and generalized weakness. He is an end-stage renal disease patient on hemodialysis.  He has a history of prior stroke. Patient's daughter states that he developed Altered mental status  from his baseline. She states that "he just wasn't acting right" as per chart.  CT of the head with no acute abnormality. Hospitalists were called for admission for TIA.   Clinical Impression   This patient is a 69  year old male who came to [facility] with R sided weakness. He lives in a one story home with a ramp to enter. He had been decreasing ADL and functional mobility.        Follow Up Recommendations  SNF    Equipment Recommendations       Recommendations for Other Services       Precautions / Restrictions Precautions Precautions: Fall Precaution Comments: Pt/daughter report that recently he has been having more and more falls and has needed to call EMS ~1x/week to help him up from the floor Restrictions Weight Bearing Restrictions: No      Mobility Bed Mobility               General bed mobility comments: minimal assist supine to sit and sit to supine  Transfers                       Balance                                            ADL                                         General ADL Comments: Had been declining recently with ADL and spends most time in wheel chair. He will need assist for dressing, bathing and toileting.     Vision     Perception     Praxis      Pertinent Vitals/Pain       Hand Dominance      Extremity/Trunk Assessment Upper Extremity Assessment Upper Extremity Assessment: RUE deficits/detail;LUE deficits/detail RUE Deficits / Details: Shoulder flexion limited to 5 degrees, shoulder and elbow flexion strenght not tested, elbow extension 4/5 forearm and wrist 5/5 grip 41 lbs.  RUE Sensation:  (light touch, sharp, temp appears to be intact.) LUE Deficits / Details: Shoulder flexion 3+/5 distal motions are 5/5 sensation intact, 35 lbs.   Lower Extremity Assessment Lower Extremity Assessment: Defer to PT evaluation       Communication Communication Communication:  (Sleepy, slow speech)   Cognition Arousal/Alertness:  (sleepy)   Overall Cognitive Status: Impaired/Different from baseline                     General Comments       Exercises       Shoulder Instructions      Home Living Family/patient expects to be discharged to:: Private residence Living  Arrangements: Children   Type of Home: House Home Access: Ramped entrance     Home Layout: One level     Bathroom Shower/Tub: Walk-in shower                    Prior Functioning/Environment Level of Independence: Needs assistance  Gait / Transfers Assistance Needed: pt rarely walks, mostly in w/c           OT Diagnosis: Generalized weakness   OT Problem List: Decreased strength;Decreased range of motion;Decreased activity tolerance;Impaired balance (sitting and/or standing);Impaired UE functional use   OT Treatment/Interventions: Self-care/ADL training    OT Goals(Current goals can be found in the care plan section) Acute Rehab OT Goals Patient Stated Goal: Patient wants to to go home  OT Goal Formulation: With patient Time For Goal Achievement: 10/19/14 Potential to Achieve Goals: Good  OT Frequency: Min 1X/week   Barriers to D/C:            Co-evaluation              End of Session Equipment Utilized During Treatment: Gait belt;Rolling walker (stroke test  kit)  Activity Tolerance: Patient limited by fatigue Patient left: in bed;with call bell/phone within reach;with bed alarm set   Time: 1000-1033 OT Time Calculation (min): 33 min Charges:  OT General Charges $OT Visit: 1 Procedure OT Evaluation $Initial OT Evaluation Tier I: 1 Procedure G-Codes:    Myrene Galas, MS/OTR/L  10/05/2014, 10:57 AM

## 2014-10-05 NOTE — Plan of Care (Signed)
Problem: Discharge/Transitional Outcomes Goal: Barriers To Progression Addressed/Resolved Outcome: Progressing Pt is from home with daughter Recently discharged on 9/4 from [facility]. Hx of Diabetes, HLD, restless leg syndrome, DM II, R rotator cuff tear, stroke. Continue on home medications Hx of multiple falls Goal: Other Discharge Outcomes/Goals Outcome: Progressing Pt is alert to self and location, disoriented to time and situation. No c/o pain at this time. Benadryl given for itchiness. NIH scale of 8, c/o right sided weakness and blurriness in right eye. Pt is unable to read. Patient has slurred speech as well. C/o urgency and incontinence of urine at times.

## 2014-10-05 NOTE — Progress Notes (Signed)
Speech Therapy Note: received order and consulted NSG then met w/ pt and family. Pt was eating his dinner meal w/ no obvious or reported s/s of aspiration noted during the time he was feeding himself. Dtr present and stated she had not noted any swallowing problems. Pt responded verbally to Dtr and NSG/SLP but Dtr stated she has noticed he has been saying less when addressed. She indicated pt had expressive language deficits when he had his last CVA. Rec. F/u tomorrow w/ language evaluation to determined need for f/u at discharge. Rec. General aspiration precautions w/ current diet as ordered. Dtr and pt agreed. 

## 2014-10-05 NOTE — Progress Notes (Signed)
PT Cancellation Note  Patient Details Name: Troy Gallagher MRN: [id number] DOB: 21-Jan-1946   Cancelled Treatment:    Reason Eval/Treat Not Completed: Patient at procedure or test/unavailable (Consult received and chart reviewed.  Evaluation attempted. Patient currently off unit for diagnostic testing; will re-attempt at later time/date as medically appropriate and available.)   [name] H. Owens Shark, PT, DPT, NCS 10/05/2014, 9:08 AM [phone]

## 2014-10-05 NOTE — Progress Notes (Signed)
[facility]  ROUNDING NOTE   Subjective:   Readmitted for weakness and altered mental status. CT negative for CVA.  Now eating lunch.   Objective:  Vital signs in last 24 hours:  Temp:  [98.3 F (36.8 C)-98.7 F (37.1 C)] 98.3 F (36.8 C) (09/06 0506) Pulse Rate:  [67-85] 72 (09/06 0506) Resp:  [17-20] 20 (09/06 0506) BP: (133-158)/(53-60) 154/56 mmHg (09/06 0506) SpO2:  [96 %-99 %] 96 % (09/06 0506) Weight:  [106.142 kg (234 lb)-107.82 kg (237 lb 11.2 oz)] 107.82 kg (237 lb 11.2 oz) (09/05 2348)  Weight change:  Filed Weights   10/04/14 1828 10/04/14 2348  Weight: 106.142 kg (234 lb) 107.82 kg (237 lb 11.2 oz)    Intake/Output: I/O last 3 completed shifts: In: -  Out: 200 [Urine:200]   Intake/Output this shift:     Physical Exam: General: NAD, laying in bed  Head: Normocephalic, atraumatic. Moist oral mucosal membranes  Eyes: Anicteric, PERRL  Neck: Supple, trachea midline  Lungs:  Clear to auscultation normal effort  Heart: Regular rate and rhythm  Abdomen:  Soft, nontender, BS present  Extremities:  1+ peripheral edema.  Neurologic: Nonfocal, moving all four extremities  Skin: No lesions     Access: LUE AVF    Basic Metabolic Panel:  Recent Labs Lab 09/30/14 0005 09/30/14 0516 09/30/14 1633 10/04/14 1924 10/05/14 0542  NA 139 139 138 133* 139  K 3.9 3.6 4.1 4.3 4.2  CL 101 102 101 97* 105  CO2 26 23 21* 23 23  GLUCOSE 102* 92 71 112* 93  BUN 64* 67* 65* 69* 75*  CREATININE 8.38* 8.57*  8.61* 8.56* 7.89* 8.19*  CALCIUM 9.4 9.3 9.6 9.7 9.2  MG  --  1.9  --   --   --   PHOS  --  6.5* 5.5*  --   --     Liver Function Tests:  Recent Labs Lab 09/30/14 0005 09/30/14 1633 10/04/14 1924  AST 18  --  20  ALT 12*  --  18  ALKPHOS 66  --  65  BILITOT 0.3  --  0.4  PROT 7.6  --  7.6  ALBUMIN 3.4* 3.1* 3.3*   No results for input(s): LIPASE, AMYLASE in the last 168 hours. No results for input(s): AMMONIA in the last 168  hours.  CBC:  Recent Labs Lab 09/30/14 0516 09/30/14 1633 10/04/14 1924 10/05/14 0542  WBC 8.8 8.8 9.4 6.5  NEUTROABS  --   --  7.9*  --   HGB 10.1* 9.5* 9.9* 8.8*  HCT 30.3* 29.6* 30.3* 27.0*  MCV 94.9 95.0 95.4 95.2  PLT 319 316 345 311    Cardiac Enzymes:  Recent Labs Lab 09/30/14 0005 09/30/14 0516 09/30/14 1110 10/04/14 1924  TROPONINI 0.04* 0.04* 0.03 0.03    BNP: Invalid input(s): POCBNP  CBG:  Recent Labs Lab 10/04/14 2348 10/05/14 0539 10/05/14 0733 10/05/14 1122  GLUCAP 136* 99 88 49    Microbiology: Results for orders placed or performed during the hospital encounter of 10/24/09  Surgical pcr screen     Status: None   Collection Time: 10/19/09  2:01 PM  Result Value Ref Range Status   MRSA, PCR NEGATIVE NEGATIVE Final   Staphylococcus aureus  NEGATIVE Final    NEGATIVE        The Xpert SA Assay (FDA approved for NASAL specimens only), is one component of a comprehensive surveillance program.  It is not intended to diagnose infection nor  to guide or monitor treatment.    Coagulation Studies:  Recent Labs  10/04/14 1924  LABPROT 14.0  INR 1.06    Urinalysis:  Recent Labs  10/04/14 1925  COLORURINE YELLOW*  LABSPEC 1.015  PHURINE 7.0  GLUCOSEU 50*  HGBUR NEGATIVE  BILIRUBINUR NEGATIVE  KETONESUR NEGATIVE  PROTEINUR >500*  NITRITE NEGATIVE  LEUKOCYTESUR NEGATIVE      Imaging: Ct Head Wo Contrast  10/04/2014   CLINICAL DATA:  Right-sided facial droop.  Alert and oriented  EXAM: CT HEAD WITHOUT CONTRAST  TECHNIQUE: Contiguous axial images were obtained from the base of the skull through the vertex without intravenous contrast.  COMPARISON:  09/30/2014  FINDINGS: There is no evidence of mass effect, midline shift, or extra-axial fluid collections. There is no evidence of a space-occupying lesion or intracranial hemorrhage. There is no evidence of a cortical-based area of acute infarction. There is generalized cerebral  atrophy. There is periventricular white matter low attenuation likely secondary to microangiopathy.  The ventricles and sulci are appropriate for the patient's age. The basal cisterns are patent.  Visualized portions of the orbits are unremarkable. The visualized portions of the paranasal sinuses and mastoid air cells are unremarkable. Cerebrovascular atherosclerotic calcifications are noted.  The osseous structures are unremarkable.  IMPRESSION: 1. No acute intracranial pathology. 2. Chronic microvascular disease and cerebral atrophy.   Electronically Signed   By: Kathreen Devoid   On: 10/04/2014 19:11   US Carotid Bilateral  10/05/2014   CLINICAL DATA:  TIA, hypertension, syncope, previous tobacco abuse  EXAM: BILATERAL CAROTID DUPLEX ULTRASOUND  TECHNIQUE: Pearline Cables scale imaging, color Doppler and duplex ultrasound was performed of bilateral carotid and vertebral arteries in the neck.  COMPARISON:  01/19/2014  REVIEW OF SYSTEMS: Quantification of carotid stenosis is based on velocity parameters that correlate the residual internal carotid diameter with NASCET-based stenosis levels, using the diameter of the distal internal carotid lumen as the denominator for stenosis measurement.  The following velocity measurements were obtained:  PEAK SYSTOLIC/END DIASTOLIC  RIGHT  ICA:                     138/21cm/sec  CCA:                     322/0UR/KYH  SYSTOLIC ICA/CCA RATIO:  0.7  DIASTOLIC ICA/CCA RATIO: 2.5  ECA:                     255cm/sec  LEFT  ICA:                     255/18cm/sec  CCA:                     062/37SE/GBT  SYSTOLIC ICA/CCA RATIO:  5.17  DIASTOLIC ICA/CCA RATIO: .3  ECA:                     277cm/sec  FINDINGS: RIGHT CAROTID ARTERY: Circumferential partially calcified plaque in the mid and distal common carotid carotid artery and bulb without high-grade stenosis. Plaque involves the carotid bifurcation and proximal internal and external carotid arteries. No high-grade stenosis. Elevated peak systolic  velocities in the proximal ECA. No focal aliasing on color Doppler interrogation.  RIGHT VERTEBRAL ARTERY:  Normal flow direction and waveform.  LEFT CAROTID ARTERY: Eccentric partially calcified plaque in the distal common carotid artery and bulb. Plaque at the origin of the external carotid artery with elevated  peak systolic velocities distally. Irregular partially calcified plaque in the proximal ICA with elevated peak systolic velocities and some focal aliasing on color Doppler interrogation. Waveforms in the distal IC unremarkable.  LEFT VERTEBRAL ARTERY: Normal flow direction and waveform.  IMPRESSION: 1. Bilateral carotid bifurcation and proximal ICA plaque, resulting in 50-69% diameter stenosis on the right, 70-99% diameter stenosis on the left. 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 Consider vascular surgical or neurointerventional radiology consultation.   Electronically Signed   By: Lucrezia Europe M.D.   On: 10/05/2014 10:09     Medications:     . ALPRAZolam  0.5 mg Oral QHS  . aspirin  325 mg Oral Daily  . atorvastatin  40 mg Oral QPM  . cinacalcet  30 mg Oral QPM  . cycloSPORINE  1 drop Both Eyes BID  . heparin  5,000 Units Subcutaneous 3 times per day  . insulin aspart  0-9 Units Subcutaneous Q6H  . pantoprazole  40 mg Oral BID  . rOPINIRole  2 mg Oral QHS  . sevelamer carbonate  1,600 mg Oral TID WC   acetaminophen **OR** acetaminophen, ipratropium-albuterol, ondansetron **OR** ondansetron (ZOFRAN) IV  Assessment/ Plan:  69 y.o. male  with end-stage renal disease on hemodialysis Monday, Wednesday, Friday, hypertension, COPD, GERD, gout, left upper extremity AV fistula, restless leg syndrome, hyperlipidemia who was admitted for generalized weakness.  1.  End-stage renal disease on hemodialysis Monday, Wednesday, Friday. - next treatment for tomorrow. Orders prepared.   2. Anemia chronic kidney disease.  Hgb 8.8, epogen 10000 units IV with HD.   3.  Secondary  hyperparathyroidism. Phos 5.5. PTH as outpatient of 439.  - sevelamer with meals.  - Continue cinacalcet   4.  Hypertension:  Decent control.  - not on oral hypertensive agents currently.    LOS:  Lavonia Dana 9/6/201612:39 PM

## 2014-10-05 NOTE — Consult Note (Signed)
CC: confusion and R sided weakness.   HPI: Troy Gallagher is an 69 y.o. male  who presents with complaint of right hemiparesis. Patient was just discharged from the hospital where he had been admitted for elevated troponin and generalized weakness. 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 Patient's daughter states that he developed an alteration from his baseline mood and functioning.   Today still confusion and evident R sided weakness is present. Couldn't tell me location date/time.   Past Medical History  Diagnosis Date  . Complication of anesthesia   . Hypertension   . Stroke   . GERD (gastroesophageal reflux disease)   . Headache   . Kidney dialysis status [id number]  . HLD (hyperlipidemia)   . Diabetes mellitus, type II   . Seizure   . BPH (benign prostatic hyperplasia)   . COPD (chronic obstructive pulmonary disease)   . PVD (peripheral vascular disease)   . Carotid stenosis   . Adenoma of large intestine   . ESRD on hemodialysis     MWF  . Anxiety   . RLS (restless legs syndrome)     Past Surgical History  Procedure Laterality Date  . Shoulder arthroscopy w/ rotator cuff repair Left 1999  . Neck surgery N/A 10/24/2009    Cervical Diskectomy- C3-4, 4-5, 5-6 anterior disckectomy by Dr. Arnoldo Morale at [facility]  . Av fistula placement Left 2 years  . Colonoscopy N/A 06/14/2014    Procedure: COLONOSCOPY;  Surgeon: Hulen Luster, MD;  Location: [facility] ENDOSCOPY;  Service: Gastroenterology;  Laterality: N/A;  . Carotid endarterectomy Right 06/05/2013    Dr. Delana Meyer  . Tonsillectomy and adenoidectomy  1950  . 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 Repeat in 3 years  . Carotid doppler ultrasound  04/22/2013    85-90% stenosis of the RICA, 60-70% stenosis of left cmmon carotid and origin of left subclavian  . Doppler echocardiography  12/04/2011    Bilateral stenosis  80%  . Arch aortogram  04/07/2013    85-90% Stenosis origin of right ICA. 65-70% narrowing of left CCA. 50-60% narrowing of left subclavian artery    Family History  Problem Relation Age of Onset  . Stroke Mother   . Prostate cancer Father   . Heart disease Sister     Social History:  reports that he quit smoking about 12 years ago. His smoking use included Cigarettes. He has a 60 pack-year smoking history. He uses smokeless tobacco. He reports that he does not drink alcohol or use illicit drugs.  Allergies  Allergen Reactions  . Sulfur Hives    Medications: I have reviewed the patient's current medications.  ROS: Not able to obtain due to confusion   Physical Examination: Blood pressure 154/56, pulse 72, temperature 98.3 F (36.8 C), temperature source Oral, resp. rate 20, height 5\' 9"  (1.753 m), weight 107.82 kg (237 lb 11.2 oz), SpO2 96 %.   Neurological Examination Mental Status: Alert and oriented to person only. Speech dysarthric.  Cranial Nerves: II: Discs flat bilaterally; Visual fields grossly normal, pupils equal, round, reactive to light and accommodation III,IV, VI: ptosis not present, extra-ocular motions intact bilaterally V,VII: smile symmetric, facial light touch sensation normal bilaterally VIII: hearing normal bilaterally IX,X: gag reflex present XI: bilateral shoulder shrug XII: midline tongue extension Motor: Right : Upper extremity   2/5    Left:  Upper extremity   5/5  Lower extremity   2/5     Lower extremity   5/5 Tone and bulk:normal tone throughout; no atrophy noted Sensory: Pinprick and light touch intact throughout, bilaterally Deep Tendon Reflexes: 21 and symmetric throughout Plantars: Right: downgoing   Left: downgoing Cerebellar: unable to test on R side Gait: not tested.       Laboratory Studies:   Basic Metabolic Panel:  Recent Labs Lab 09/30/14 0005 09/30/14 0516 09/30/14 1633 10/04/14 1924 10/05/14 0542  NA 139 139  138 133* 139  K 3.9 3.6 4.1 4.3 4.2  CL 101 102 101 97* 105  CO2 26 23 21* 23 23  GLUCOSE 102* 92 71 112* 93  BUN 64* 67* 65* 69* 75*  CREATININE 8.38* 8.57*  8.61* 8.56* 7.89* 8.19*  CALCIUM 9.4 9.3 9.6 9.7 9.2  MG  --  1.9  --   --   --   PHOS  --  6.5* 5.5*  --   --     Liver Function Tests:  Recent Labs Lab 09/30/14 0005 09/30/14 1633 10/04/14 1924  AST 18  --  20  ALT 12*  --  18  ALKPHOS 66  --  65  BILITOT 0.3  --  0.4  PROT 7.6  --  7.6  ALBUMIN 3.4* 3.1* 3.3*   No results for input(s): LIPASE, AMYLASE in the last 168 hours. No results for input(s): AMMONIA in the last 168 hours.  CBC:  Recent Labs Lab 09/30/14 0516 09/30/14 1633 10/04/14 1924 10/05/14 0542  WBC 8.8 8.8 9.4 6.5  NEUTROABS  --   --  7.9*  --   HGB 10.1* 9.5* 9.9* 8.8*  HCT 30.3* 29.6* 30.3* 27.0*  MCV 94.9 95.0 95.4 95.2  PLT 319 316 345 311    Cardiac Enzymes:  Recent Labs Lab 09/30/14 0005 09/30/14 0516 09/30/14 1110 10/04/14 1924  TROPONINI 0.04* 0.04* 0.03 0.03    BNP: Invalid input(s): POCBNP  CBG:  Recent Labs Lab 10/04/14 2348 10/05/14 0539 10/05/14 0733  GLUCAP 136* 99 67    Microbiology: Results for orders placed or performed during the hospital encounter of 10/24/09  Surgical pcr screen     Status: None   Collection Time: 10/19/09  2:01 PM  Result Value Ref Range Status   MRSA, PCR NEGATIVE NEGATIVE Final   Staphylococcus aureus  NEGATIVE Final    NEGATIVE        The Xpert SA Assay (FDA approved for NASAL specimens only), is one component of a comprehensive surveillance program.  It is not intended to diagnose infection nor to guide or monitor treatment.    Coagulation Studies:  Recent Labs  10/04/14 1924  LABPROT 14.0  INR 1.06    Urinalysis:  Recent Labs Lab 10/04/14 1925  COLORURINE YELLOW*  LABSPEC 1.015  PHURINE 7.0  GLUCOSEU 50*  HGBUR NEGATIVE  BILIRUBINUR NEGATIVE  KETONESUR NEGATIVE  PROTEINUR >500*  NITRITE NEGATIVE   LEUKOCYTESUR NEGATIVE    Lipid Panel:     Component Value Date/Time   CHOL 151 12/04/2011 0508   TRIG 219* 12/04/2011 0508   HDL 31* 12/04/2011 0508   VLDL 44* 12/04/2011 0508   LDLCALC 76 12/04/2011 0508    HgbA1C:  Lab Results  Component Value Date   HGBA1C 5.5 09/30/2014    Urine Drug Screen:     Component Value Date/Time   LABOPIA POSITIVE* 10/04/2014 1925   LABBENZ POSITIVE* 10/04/2014 1925   AMPHETMU NONE DETECTED 10/04/2014  [place] DETECTED 10/04/2014 1925   LABBARB NONE DETECTED 10/04/2014 1925    Alcohol Level:  Recent Labs Lab 10/04/14 1924  ETH <5    Other results: EKG: normal EKG, normal sinus rhythm, unchanged from previous tracings.  Imaging: Ct Head Wo Contrast  10/04/2014   CLINICAL DATA:  Right-sided facial droop.  Alert and oriented  EXAM: CT HEAD WITHOUT CONTRAST  TECHNIQUE: Contiguous axial images were obtained from the base of the skull through the vertex without intravenous contrast.  COMPARISON:  09/30/2014  FINDINGS: There is no evidence of mass effect, midline shift, or extra-axial fluid collections. There is no evidence of a space-occupying lesion or intracranial hemorrhage. There is no evidence of a cortical-based area of acute infarction. There is generalized cerebral atrophy. There is periventricular white matter low attenuation likely secondary to microangiopathy.  The ventricles and sulci are appropriate for the patient's age. The basal cisterns are patent.  Visualized portions of the orbits are unremarkable. The visualized portions of the paranasal sinuses and mastoid air cells are unremarkable. Cerebrovascular atherosclerotic calcifications are noted.  The osseous structures are unremarkable.  IMPRESSION: 1. No acute intracranial pathology. 2. Chronic microvascular disease and cerebral atrophy.   Electronically Signed   By: Kathreen Devoid   On: 10/04/2014 19:11   US Carotid Bilateral  10/05/2014   CLINICAL DATA:  TIA, hypertension,  syncope, previous tobacco abuse  EXAM: BILATERAL CAROTID DUPLEX ULTRASOUND  TECHNIQUE: Pearline Cables scale imaging, color Doppler and duplex ultrasound was performed of bilateral carotid and vertebral arteries in the neck.  COMPARISON:  01/19/2014  REVIEW OF SYSTEMS: Quantification of carotid stenosis is based on velocity parameters that correlate the residual internal carotid diameter with NASCET-based stenosis levels, using the diameter of the distal internal carotid lumen as the denominator for stenosis measurement.  The following velocity measurements were obtained:  PEAK SYSTOLIC/END DIASTOLIC  RIGHT  ICA:                     138/21cm/sec  CCA:                     168/3FG/BMS  SYSTOLIC ICA/CCA RATIO:  0.7  DIASTOLIC ICA/CCA RATIO: 2.5  ECA:                     255cm/sec  LEFT  ICA:                     255/18cm/sec  CCA:                     111/55MC/EYE  SYSTOLIC ICA/CCA RATIO:  2.33  DIASTOLIC ICA/CCA RATIO: .3  ECA:                     277cm/sec  FINDINGS: RIGHT CAROTID ARTERY: Circumferential partially calcified plaque in the mid and distal common carotid carotid artery and bulb without high-grade stenosis. Plaque involves the carotid bifurcation and proximal internal and external carotid arteries. No high-grade stenosis. Elevated peak systolic velocities in the proximal ECA. No focal aliasing on color Doppler interrogation.  RIGHT VERTEBRAL ARTERY:  Normal flow direction and waveform.  LEFT CAROTID ARTERY: Eccentric partially calcified plaque in the distal common carotid artery and bulb. Plaque at the origin of the external carotid artery with elevated peak systolic velocities distally. Irregular partially calcified plaque in the proximal ICA with elevated peak systolic velocities and some focal aliasing on color  Doppler interrogation. Waveforms in the distal IC unremarkable.  LEFT VERTEBRAL ARTERY: Normal flow direction and waveform.  IMPRESSION: 1. Bilateral carotid bifurcation and proximal ICA plaque, resulting in  50-69% diameter stenosis on the right, 70-99% diameter stenosis on the left. 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 Consider vascular surgical or neurointerventional radiology consultation.   Electronically Signed   By: Lucrezia Europe M.D.   On: 10/05/2014 10:09     Assessment/Plan: 69 y.o. male  who presents with complaint of right hemiparesis. Patient was just discharged from the hospital where he had been admitted for elevated troponin and generalized weakness. 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 Patient's daughter states that he developed an alteration from his baseline mood and functioning.   Today still confusion and evident R sided weakness is present. Couldn't tell me location date/time.   Susptect L MCA stroke, awaiting to go for MRI - Stroke work up - was not on any anti platelet at home. Given ASA in ED  - ASA 325 and statin daily - 2decho, carotid doppler - PT/OT  10/05/2014, 10:27 AM

## 2014-10-05 NOTE — Evaluation (Signed)
Physical Therapy Evaluation Patient Details Name: Troy Gallagher MRN: [id number] DOB: 05/11/1945 Today's Date: 10/05/2014   History of Present Illness  presented to ER secondary to AMS, decreased responsiveness and R-sided weakness; admitted for TIA/CVA work-up.  Of note, patient with recent hospitalization (discharged 9/4) for elevated troponin, generalized weakness; refused rehab upon discharge.  Clinical Impression  Upon evaluation, patient sleeping upon arrival, but arousable to voice (requiring transition to upright for optimal alertness).  Demonstrates generalized weakness in all extremities, but no focal weakness or gross coordination/sensory deficit.  Does display marked difficulty maintaining unsupported sitting, requiring min/mod assist from therapist throughout to prevent R lateral LOB.  Patient with limited insight/awareness, limited attempts at spontaneous correction.  Able to complete partial sit/stand with RW, min/mod assist +2 for safety; unable to achieve full postural extension, unable to maintain >10 seconds each trial.  Attempted single step forward/backward with RW, min/mod assist +2; unsafe to attempt further due to balance deficits and gross LE weakness in closed-chain position. Would benefit from skilled PT to address above deficits and promote optimal return to PLOF; recommend transition to STR upon discharge from acute hospitalization.     Follow Up Recommendations SNF    Equipment Recommendations       Recommendations for Other Services       Precautions / Restrictions Precautions Precautions: Fall Precaution Comments: No BP L UE (L AVF) Restrictions Weight Bearing Restrictions: No      Mobility  Bed Mobility Overal bed mobility: Needs Assistance Bed Mobility: Supine to Sit;Sit to Supine     Supine to sit: Min assist Sit to supine: Min assist   General bed mobility comments: transition towards R  Transfers Overall transfer level: Needs assistance    Transfers: Sit to/from Stand Sit to Stand: Min assist;Mod assist;+2 physical assistance         General transfer comment: assist for R LE placement for appropriate WBing; able to generate lift off (requires UE support on RW), but unable to achieve full, erect stance.  Spontaneously sits after 8-10 seconds with each standing trial  Ambulation/Gait Ambulation/Gait assistance: Min assist;Mod assist;+2 physical assistance Ambulation Distance (Feet): 1 Feet Assistive device: Rolling walker (2 wheeled)       General Gait Details: very forward flexed posture; able to take single step forward/backward with each LE, min/mod assist for balance and knee control. Poor balance; high fall risk.  Unsafe to attempt additional steps at this time.  Stairs            Wheelchair Mobility    Modified Rankin (Stroke Patients Only)       Balance Overall balance assessment: Needs assistance Sitting-balance support: No upper extremity supported Sitting balance-Leahy Scale: Fair Sitting balance - Comments: progressive R lateral lean, min cuing, min/mod assist for correction to midline.   Standing balance support: Bilateral upper extremity supported Standing balance-Leahy Scale: Poor                               Pertinent Vitals/Pain Pain Assessment: No/denies pain    Home Living Family/patient expects to be discharged to:: Private residence Living Arrangements: Children Available Help at Discharge: Family Type of Home: House Home Access: Stairs to enter     Home Layout: One level Home Equipment: Wheelchair - manual Additional Comments: patient limited historian; will verify with daughter when available    Prior Function Level of Independence: Needs assistance   Gait / Transfers Assistance Needed:  pt rarely walks, mostly in w/c      Comments: Uses manual WC as primary mobility. Unable to describe/quantify transfer technique or level of assist required at baseline.   Will verify with daughter.  Per chart, has experienced progressed increase in falls over recent weeks.     Hand Dominance        Extremity/Trunk Assessment   Upper Extremity Assessment:  (R shoulder with chronic RTC tear, elevation to approx 20 degrees; elbow, wrist, hand grossly WFL, 4/5.  L UE grossly WFL, 4/5.) RUE Deficits / Details: Shoulder flexion limited to 5 degrees, shoulder and elbow flexion strenght not tested, elbow extension 4/5 forearm and wrist 5/5 grip 41 lbs.    RUE Sensation:  (light touch, sharp, temp appears to be intact.) LUE Deficits / Details: Shoulder flexion 3+/5 distal motions are 5/5 sensation intact, 35 lbs.   Lower Extremity Assessment: Generalized weakness (bilat LEs grossly 3+ to 4-/5 throughout.  Denies sensory deficit, but question mild extinction in R LE (? sensory deficit vs. cognitive deficit?))      Cervical / Trunk Assessment:  (R lateral lean with unsupported sitting)  Communication   Communication:  (slightly slurred at times; inconsistent.  Able to correctly name/identify 2/2 objects)  Cognition Arousal/Alertness: Awake/alert Behavior During Therapy: WFL for tasks assessed/performed Overall Cognitive Status: Impaired/Different from baseline Area of Impairment: Attention;Memory;Safety/judgement;Awareness;Problem solving   Current Attention Level: Focused Memory: Decreased short-term memory       Problem Solving: Slow processing;Decreased initiation;Difficulty sequencing      General Comments      Exercises Other Exercises Other Exercises: Unsupported sitting, worked to promote increased awareness of midline orientation and L ant/lateral weight shift.  Continues to require min/mod assist to sustain, esp with fatigue or divded attention. Other Exercises: Sit/stand x3 with RW, min/mod assis t+2--requires UE support on RW for each trial; unable to acheive full postural extension; unable to maintain >10 seconds.  (8 min)       Assessment/Plan    PT Assessment Patient needs continued PT services  PT Diagnosis Difficulty walking;Generalized weakness   PT Problem List Decreased strength;Decreased activity tolerance;Decreased balance;Decreased mobility;Decreased safety awareness;Decreased knowledge of use of DME;Pain;Decreased range of motion;Decreased coordination;Decreased cognition;Decreased knowledge of precautions;Cardiopulmonary status limiting activity  PT Treatment Interventions Gait training;Functional mobility training;Therapeutic activities;Therapeutic exercise;Neuromuscular re-education;Balance training;Wheelchair mobility training;DME instruction   PT Goals (Current goals can be found in the Care Plan section) Acute Rehab PT Goals Patient Stated Goal: Patient wants to to go home  PT Goal Formulation: With patient Time For Goal Achievement: 10/19/14 Potential to Achieve Goals: Fair    Frequency Min 2X/week (if MRI positive, will increase treatment frequency to QD)   Barriers to discharge Decreased caregiver support      Co-evaluation               End of Session Equipment Utilized During Treatment: Gait belt Activity Tolerance: Patient limited by fatigue Patient left: in bed;with call bell/phone within reach;with bed alarm set (on bedpan; RN informed/aware) Nurse Communication: Mobility status         Time: 1001-1031 PT Time Calculation (min) (ACUTE ONLY): 30 min   Charges:   PT Evaluation $Initial PT Evaluation Tier I: 1 Procedure PT Treatments $Therapeutic Activity: 8-22 mins   PT G Codes:       Jorden Mahl H. Owens Shark, PT, DPT, NCS 10/05/2014, 11:31 AM [phone]

## 2014-10-05 NOTE — Progress Notes (Signed)
*  PRELIMINARY RESULTS* Echocardiogram 2D Echocardiogram has been performed.  Troy Gallagher 10/05/2014, 8:28 AM

## 2014-10-05 NOTE — Progress Notes (Signed)
Pt is complaining of generalized itchiness, requests Benadryl. MD to pu in orders

## 2014-10-05 NOTE — Consult Note (Signed)
[facility] Vascular Consult Note  MRN : [id number]  DAHLTON HINDE is a 69 y.o. (07-06-1945) male who presents with chief complaint of  Chief Complaint  Patient presents with  . Cerebrovascular Accident  .  History of Present Illness: is a 70 y.o. male who presents with complaint of right hemiparesis. 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 Patient's daughter states that he developed an alteration from his baseline mood and functioning during the 24 hours prior to admission. She states that "he just wasn't acting right" she has also noted his speech is different. Upon clarification she states that he was acting somewhat more confused, and that he was slower to respond, did not verbalize things that he needed or wanted this well, and then the afternoon prior to admission he started to develop right upper extremity and more so right lower extremity focal weakness as well as some right facial droop for some period of time. The symptoms persisted through to his evaluation in the ED by ED physician, but resolved during his time in the ED.  CT of the head is negative for bleeding.  MRI shows a left frontal infarct.  Current Facility-Administered Medications  Medication Dose Route Frequency Provider Last Rate Last Dose  . acetaminophen (TYLENOL) tablet 650 mg  650 mg Oral Q6H PRN Lance Coon, MD       Or  . acetaminophen (TYLENOL) suppository 650 mg  650 mg Rectal Q6H PRN Lance Coon, MD      . ALPRAZolam Duanne Moron) tablet 0.5 mg  0.5 mg Oral QHS Lance Coon, MD   0.5 mg at 10/05/14 2203  . aspirin tablet 325 mg  325 mg Oral Daily Leotis Pain, MD   325 mg at 10/05/14 1416  . atorvastatin (LIPITOR) tablet 40 mg  40 mg Oral QPM Lance Coon, MD   40 mg at 10/05/14 2203  . cinacalcet (SENSIPAR) tablet 30 mg  30 mg Oral QPM Lance Coon, MD   30 mg at 10/05/14 2203  . cycloSPORINE (RESTASIS) 0.05 % ophthalmic emulsion 1 drop  1  drop Both Eyes BID Lance Coon, MD   1 drop at 10/05/14 2204  . heparin injection 5,000 Units  5,000 Units Subcutaneous 3 times per day Lance Coon, MD   5,000 Units at 10/05/14 1416  . insulin aspart (novoLOG) injection 0-9 Units  0-9 Units Subcutaneous Q6H Lance Coon, MD   1 Units at 10/05/14 0108  . ipratropium-albuterol (DUONEB) 0.5-2.5 (3) MG/3ML nebulizer solution 3 mL  3 mL Nebulization Q4H PRN Lance Coon, MD      . ondansetron [facility]) tablet 4 mg  4 mg Oral Q6H PRN Lance Coon, MD       Or  . ondansetron [facility]) injection 4 mg  4 mg Intravenous Q6H PRN Lance Coon, MD      . pantoprazole (PROTONIX) EC tablet 40 mg  40 mg Oral BID Lance Coon, MD   40 mg at 10/05/14 2203  . rOPINIRole (REQUIP) tablet 2 mg  2 mg Oral QHS Lance Coon, MD   2 mg at 10/05/14 2203  . sevelamer carbonate (RENVELA) tablet 1,600 mg  1,600 mg Oral TID WC Lance Coon, MD   1,600 mg at 10/05/14 1728    Past Medical History  Diagnosis Date  . Complication of anesthesia   . Hypertension   . Stroke   . GERD (gastroesophageal reflux disease)   . Headache   .  Kidney dialysis status [id number]  . HLD (hyperlipidemia)   . Diabetes mellitus, type II   . Seizure   . BPH (benign prostatic hyperplasia)   . COPD (chronic obstructive pulmonary disease)   . PVD (peripheral vascular disease)   . Carotid stenosis   . Adenoma of large intestine   . ESRD on hemodialysis     MWF  . Anxiety   . RLS (restless legs syndrome)     Past Surgical History  Procedure Laterality Date  . Shoulder arthroscopy w/ rotator cuff repair Left 1999  . Neck surgery N/A 10/24/2009    Cervical Diskectomy- C3-4, 4-5, 5-6 anterior disckectomy by Dr. Arnoldo Morale at [facility]  . Av fistula placement Left 2 years  . Colonoscopy N/A 06/14/2014    Procedure: COLONOSCOPY;  Surgeon: Hulen Luster, MD;  Location: [facility] ENDOSCOPY;  Service: Gastroenterology;  Laterality: N/A;  . Carotid endarterectomy Right 06/05/2013    Dr. Delana Meyer   . Tonsillectomy and adenoidectomy  1950  . 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 Repeat in 3 years  . Carotid doppler ultrasound  04/22/2013    85-90% stenosis of the RICA, 60-70% stenosis of left cmmon carotid and origin of left subclavian  . Doppler echocardiography  12/04/2011    Bilateral stenosis 80%  . Arch aortogram  04/07/2013    85-90% Stenosis origin of right ICA. 65-70% narrowing of left CCA. 50-60% narrowing of left subclavian artery    Social History Social History  Substance Use Topics  . Smoking status: Former Smoker -- 2.00 packs/day for 30 years    Types: Cigarettes    Quit date: 01/29/2002  . Smokeless tobacco: Current User  . Alcohol Use: No    Family History Family History  Problem Relation Age of Onset  . Stroke Mother   . Prostate cancer Father   . Heart disease Sister   No history of porphyria, autoimmune disease or clotting disorders.  Allergies  Allergen Reactions  . Sulfur Hives     REVIEW OF SYSTEMS (Negative unless checked)  Constitutional: [] Weight loss  [] Fever  [] Chills Cardiac: [] Chest pain   [] Chest pressure   [] Palpitations   [] Shortness of breath when laying flat   [] Shortness of breath at rest   [] Shortness of breath with exertion. Vascular:  [] Pain in legs with walking   [] Pain in legs at rest   [] Pain in legs when laying flat   [] Claudication   [] Pain in feet when walking  [] Pain in feet at rest  [] Pain in feet when laying flat   [] History of DVT   [] Phlebitis   [] Swelling in legs   [] Varicose veins   [] Non-healing ulcers Pulmonary:   [] Uses home oxygen   [] Productive cough   [] Hemoptysis   [] Wheeze  [] COPD   [] Asthma Neurologic:  [] Dizziness  [] Blackouts   [] Seizures   [x] History of stroke   [x] History of TIA  [x] Aphasia   [] Temporary blindness   [] Dysphagia   [x] Weakness or numbness in arms   [] Weakness or numbness in legs Musculoskeletal:  [] Arthritis   [] Joint swelling    [] Joint pain   [] Low back pain Hematologic:  [] Easy bruising  [] Easy bleeding   [] Hypercoagulable state   [] Anemic  [] Hepatitis Gastrointestinal:  [] Blood in stool   [] Vomiting blood  [] Gastroesophageal reflux/heartburn   [] Difficulty swallowing. Genitourinary:  [] Chronic kidney disease   [] Difficult urination  [] Frequent urination  [] Burning with urination   [] Blood in urine Skin:  []   Rashes   [] Ulcers   [] Wounds Psychological:  [] History of anxiety   []  History of major depression.    Physical Examination  Filed Vitals:   10/04/14 2348 10/05/14 0246 10/05/14 0506 10/05/14 2057  BP: 150/56 133/53 154/56 171/85  Pulse: 77 67 72 73  Temp: 98.4 F (36.9 C)  98.3 F (36.8 C) 98.9 F (37.2 C)  TempSrc: Oral  Oral Oral  Resp: 18 20 20 17   Height: 5\' 9"  (1.753 m)     Weight: 107.82 kg (237 lb 11.2 oz)     SpO2: 96% 99% 96% 98%   Body mass index is 35.09 kg/(m^2).  Head: [place]/AT, No temporalis wasting. Prominent temp pulse not noted. Ear/Nose/Throat: Nares w/o erythema or drainage, oropharynx w/o obsrtuction, Mallampati score: class 3.  Dentition dentures.  Eyes: PERRLA, Sclera nonicteric.  Neck: Supple, no nuchal rigidity.  No bruit or JVD.  Pulmonary:  Breath sounds equal bilaterally, no use of accessory muscles.  Cardiac: RRR, normal S1, S2, no Murmurs, rubs or gallops. Vascular: bilateral carotid bruits, well healed right CEA incisional scar.  Trace pedal pulses bilaterally, left arm av fistula good thrill and good bruit Gastrointestinal: soft, non-tender, non-distended.  Musculoskeletal: Moves all extremities.  No deformity or atrophy. No edema. Neurologic: CN 2-12 intact. Symmetrical.  Speech is fluent.  Psychiatric: Judgment intact, Mood & affect appropriate for pt's clinical situation. Dermatologic: No rashes or ulcers noted.  No cellulitis or open wounds. Lymph : No Cervical,  or Inguinal lymphadenopathy.      CBC Lab Results  Component Value Date   WBC 6.5 10/05/2014    HGB 8.8* 10/05/2014   HCT 27.0* 10/05/2014   MCV 95.2 10/05/2014   PLT 311 10/05/2014    BMET    Component Value Date/Time   NA 139 10/05/2014 0542   NA 139 04/02/2014   NA 137 01/19/2014 1354   K 4.2 10/05/2014 0542   K 2.8* 01/19/2014 1354   CL 105 10/05/2014 0542   CL 100 01/19/2014 1354   CO2 23 10/05/2014 0542   CO2 26 01/19/2014 1354   GLUCOSE 93 10/05/2014 0542   GLUCOSE 100* 01/19/2014 1354   BUN 75* 10/05/2014 0542   BUN 31* 04/02/2014   BUN 41* 01/19/2014 1354   CREATININE 8.19* 10/05/2014 0542   CREATININE 3.7* 04/02/2014   CREATININE 4.92* 01/19/2014 1354   CALCIUM 9.2 10/05/2014 0542   CALCIUM 9.4 01/19/2014 1354   GFRNONAA 6* 10/05/2014 0542   GFRNONAA 13* 01/19/2014 1354   GFRNONAA 16* 06/06/2013 0501   GFRAA 7* 10/05/2014 0542   GFRAA 15* 01/19/2014 1354   GFRAA 18* 06/06/2013 0501   Estimated Creatinine Clearance: 10.1 mL/min (by C-G formula based on Cr of 8.19).  COAG Lab Results  Component Value Date   INR 1.06 10/04/2014   INR 1.16 09/30/2014   INR 1.1 12/31/2013    Radiology CT angiogram study is reviewed by myself and there is a 50% distal common carotid stenosis which even in the face of a CVA may not need treatment.  However, there does appear to be a >70% stenosis of the origin of the left common carotid at the level of the arch.   Assessment/Plan TIA (transient ischemic attack) - patient has a prior history of stroke, he presented to the ER with symptoms of right hemiparesis which resolved while he was in the ED. which givne his symptoms would need treatment most likely with stenting.  Based on hisrapid improvement he may be a candidate  for early treatment I would favor seeing how he does over the next day or so and then plan for angiography with the possibility of intervention ESRD on hemodialysis - consult nephrology for hemodialysis support.  HTN (hypertension) - stable to mildly elevated at this time, continue home meds for this   Type 2 diabetes mellitus - sliding scale insulin with appropriate fingerstick glucose checks every 6 hours while nothing by mouth for now  Anxiety - continue home meds  GERD (gastroesophageal reflux disease   Fatuma Dowers, Dolores Lory, MD  10/05/2014 10:06 PM

## 2014-10-06 DIAGNOSIS — G451 Carotid artery syndrome (hemispheric): Secondary | ICD-10-CM

## 2014-10-06 LAB — CBC
HEMATOCRIT: 28.9 % — AB (ref 40.0–52.0)
Hemoglobin: 9.6 g/dL — ABNORMAL LOW (ref 13.0–18.0)
MCH: 31.5 pg (ref 26.0–34.0)
MCHC: 33.3 g/dL (ref 32.0–36.0)
MCV: 94.6 fL (ref 80.0–100.0)
PLATELETS: 349 10*3/uL (ref 150–440)
RBC: 3.05 MIL/uL — ABNORMAL LOW (ref 4.40–5.90)
RDW: 15 % — AB (ref 11.5–14.5)
WBC: 7 10*3/uL (ref 3.8–10.6)

## 2014-10-06 LAB — RENAL FUNCTION PANEL
Albumin: 3.2 g/dL — ABNORMAL LOW (ref 3.5–5.0)
Anion gap: 14 (ref 5–15)
BUN: 80 mg/dL — AB (ref 6–20)
CHLORIDE: 105 mmol/L (ref 101–111)
CO2: 19 mmol/L — AB (ref 22–32)
CREATININE: 8.3 mg/dL — AB (ref 0.61–1.24)
Calcium: 9.6 mg/dL (ref 8.9–10.3)
GFR calc Af Amer: 7 mL/min — ABNORMAL LOW (ref >60)
GFR calc non Af Amer: 6 mL/min — ABNORMAL LOW (ref >60)
GLUCOSE: 108 mg/dL — AB (ref 65–99)
POTASSIUM: 4.6 mmol/L (ref 3.5–5.1)
Phosphorus: 5.7 mg/dL — ABNORMAL HIGH (ref 2.5–4.6)
Sodium: 138 mmol/L (ref 135–145)

## 2014-10-06 LAB — GLUCOSE, CAPILLARY
GLUCOSE-CAPILLARY: 117 mg/dL — AB (ref 65–99)
Glucose-Capillary: 85 mg/dL (ref 65–99)
Glucose-Capillary: 90 mg/dL (ref 65–99)
Glucose-Capillary: 94 mg/dL (ref 65–99)
Glucose-Capillary: 95 mg/dL (ref 65–99)

## 2014-10-06 MED ORDER — ALLOPURINOL 100 MG PO TABS
100.0000 mg | ORAL_TABLET | Freq: Every day | ORAL | Status: DC
  Administered 2014-10-06: 100 mg via ORAL
  Filled 2014-10-06: qty 1

## 2014-10-06 MED ORDER — FUROSEMIDE 40 MG PO TABS
80.0000 mg | ORAL_TABLET | ORAL | Status: DC
  Administered 2014-10-07: 10:00:00 80 mg via ORAL
  Filled 2014-10-06: qty 2

## 2014-10-06 MED ORDER — POLYVINYL ALCOHOL 1.4 % OP SOLN
1.0000 [drp] | OPHTHALMIC | Status: DC | PRN
  Administered 2014-10-06 – 2014-10-07 (×2): 1 [drp] via OPHTHALMIC
  Filled 2014-10-06: qty 15

## 2014-10-06 MED ORDER — EPOETIN ALFA 10000 UNIT/ML IJ SOLN: 10000.0000 [IU] | Freq: Once | INTRAMUSCULAR | Status: DC

## 2014-10-06 NOTE — Evaluation (Signed)
Speech Language Pathology Evaluation Patient Details Name: Troy Gallagher MRN: [id number] DOB: [birth year] Today's Date: 10/06/2014 Time:  -     Problem List:  Patient Active Problem List   Diagnosis Date Noted  . TIA (transient ischemic attack) 10/04/2014  . Rotator cuff tear arthropathy of right shoulder 10/03/2014  . Generalized weakness 10/01/2014  . Elevated troponin 09/30/2014  . Weakness generalized 09/30/2014  . ESRD on hemodialysis 09/30/2014  . GERD (gastroesophageal reflux disease) 09/30/2014  . HTN (hypertension) 09/30/2014  . HLD (hyperlipidemia) 09/30/2014  . Type 2 diabetes mellitus 09/30/2014  . Anxiety 09/30/2014  . RLS (restless legs syndrome) 09/30/2014  . History of CVA (cerebrovascular accident) 09/28/2014  . Symptoms, musculoskeletal, limb NEC 09/28/2014  . Anemia 09/28/2014  . Hyperlipidemia, mixed 09/28/2014  . Bilateral carotid artery stenosis 12/04/2011  . Depressive disorder 01/29/1998   Past Medical History:  Past Medical History  Diagnosis Date  . Complication of anesthesia   . Hypertension   . Stroke   . GERD (gastroesophageal reflux disease)   . Headache   . Kidney dialysis status [id number]  . HLD (hyperlipidemia)   . Diabetes mellitus, type II   . Seizure   . BPH (benign prostatic hyperplasia)   . COPD (chronic obstructive pulmonary disease)   . PVD (peripheral vascular disease)   . Carotid stenosis   . Adenoma of large intestine   . ESRD on hemodialysis     MWF  . Anxiety   . RLS (restless legs syndrome)    Past Surgical History:  Past Surgical History  Procedure Laterality Date  . Shoulder arthroscopy w/ rotator cuff repair Left 1999  . Neck surgery N/A 10/24/2009    Cervical Diskectomy- C3-4, 4-5, 5-6 anterior disckectomy by Dr. Arnoldo Morale at [facility]  . Av fistula placement Left 2 years  . Colonoscopy N/A 06/14/2014    Procedure: COLONOSCOPY;  Surgeon: Hulen Luster, MD;  Location: [facility] ENDOSCOPY;  Service: Gastroenterology;   Laterality: N/A;  . Carotid endarterectomy Right 06/05/2013    Dr. Delana Meyer  . Tonsillectomy and adenoidectomy  1950  . 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 Repeat in 3 years  . Carotid doppler ultrasound  04/22/2013    85-90% stenosis of the RICA, 60-70% stenosis of left cmmon carotid and origin of left subclavian  . Doppler echocardiography  12/04/2011    Bilateral stenosis 80%  . Arch aortogram  04/07/2013    85-90% Stenosis origin of right ICA. 65-70% narrowing of left CCA. 50-60% narrowing of left subclavian artery   HPI:  Pt w/ h/o GERD, stroke, ESRD, COPD, DM, as well as other medical isues (see chart).   Assessment / Plan / Recommendation Clinical Impression  Pt responded verbally to all questions presented, but did not elaborate unless cued. Daughted reported that she has noticed he has been saying less when addressed. Pt. had expressive language deficits after his last CVA. Pt was not oriented to time and could not accurately name the day of the week, month and present year. Pt. did respond appropriately to y/n questions and corrected clinican when incorrect information was presented (e.g., do you live in [place]? no.Marland KitchenMarland KitchenI live in [place] / is this a hotel? no...it is a hospital). Auditory word recognition intact (e.g., point to the window, door, ceiling, phone). One step sequential commands appear intact.  Repetition appears intact.  No s/s of apraxia noted.  Confontaional naming appears intact. Pt. does  not read or write at baseline. When asked to describe a picutre from the local newspaper, pt. accurately identified the action and object but did not eleaborate without cueing from the clinician.   Pt appears to present with mild cognitive impairment possibly impacting his expressive language ability as evidenced by short responses to questions and limited conversation.    SLP Assessment  See above.   Follow Up  Recommendations    Alert family/caregivers/physician to any further cognitive declines. F/u cognitive evaluation with regard to ADLs and safety at discharge.   Frequency and Duration        Pertinent Vitals/Pain None reported by patient.   SLP Goals  Patient/Family Stated Goal: unknown (none given)  SLP Evaluation Prior Functioning  Cognitive/Linguistic Baseline: Baseline deficits Baseline deficit details: Cannot read or write at baseline. Type of Home: House Available Help at Discharge: Family Education: did not learn to read or write per Dtr Vocation: Retired   Associate Professor  Overall Cognitive Status: Within Functional Limits for tasks assessed Orientation Level: Oriented to person;Oriented to place;Disoriented to time;Disoriented to situation (Disoriented to day, month and year; disoriented to reason for hospitalization(situation); oriented to person & place  (when asked if he was in a hotel, he answered no and elaborated that he is in a hospital)) Attention: Sustained Sustained Attention: Appears intact Memory: Appears intact Awareness: Appears intact Problem Solving: Impaired (No problem solving task addressed directly, however observation of behavior suggests pt. is not initiating simple problem solving strategies for ADLs (e.g., requesting warm food, turning down television when on phone, holding phone up to better attend)) Executive Function: Reasoning Reasoning: Appears intact (Looked at a picture in the newspaper and was able to explain the action and reason (for watering flowers)) Initiating Impairment:  (Pt was presented with each task, but did not initiate spontenous speech (also observed on phone when first entering the room). Did not initiate problem solving with tray of food (food was cold, pt. wanted a hot meal, but only expressed this when asked)) Behaviors: Other (comment) (Agitated by dry eyes; blinked frequently and with force)    Comprehension  Auditory  Comprehension Overall Auditory Comprehension: Appears within functional limits for tasks assessed (Pt. was able to answer y/n questions and recognize words in simple auditory comprehension tasks.) Yes/No Questions: Within Functional Limits Commands: Within Functional Limits (Followed one step commands accurately (e.g., point to the window, phone, door; point to the color green, red, orange). Two step commands followed with mod. accuracy.) Conversation: Simple Other Conversation Comments:  (Pt. answers in short responses and does not elaborate unless cued by clinican.) Interfering Components: Processing speed EffectiveTechniques: Repetition;Extra processing time;Slowed speech;Other (Comment) (Explicit directions/questions asked in simple language. Speak slowly and repeat as necessary.  Allow for extra processing time.) Visual Recognition/Discrimination Discrimination: Within Function Limits (Could identify phone, window, flowers, door, ceiling accurately. Could discern between colors when asked to identify a specific color on a page.) Reading Comprehension Reading Status: Impaired (Pt. cannot read (or write) at baseline)    Expression Expression Primary Mode of Expression: Verbal Verbal Expression Overall Verbal Expression: Impaired Initiation: Impaired Automatic Speech:  (NT) Level of Generative/Spontaneous Verbalization: Phrase;Word Repetition: No impairment Naming: No impairment Pragmatics:  (NT, however pt. appears to demonstrate appropriate pragmatic behavior (with the exception of telephone conversation in which he did not attend to the caller and hold phone to his ear)) Effective Techniques: Open ended questions (Cues to elaborate) Non-Verbal Means of Communication: Not applicable Written Expression Written Expression: Unable to assess (comment) (  Pt cannot read or write at baseline) Verbal output impacted with recent CVA as noted per daughter. Pt. Required verbal cues to elaborate on  responses and give more information (adaquate level of detail).  Oral / Motor   WFL  GO    Orinda Kenner, MS, CCC-SLP  Nickoles Gregori 10/06/2014, 11:34 AM

## 2014-10-06 NOTE — Plan of Care (Signed)
Problem: Discharge/Transitional Outcomes Goal: Barriers To Progression Addressed/Resolved Outcome: Progressing Plan of Care Progress to Goal:  PT wked w/Pt today - he's a 2+ person asst.  PT even recommended lift b/c of R-sided weakness.  PT recommends SNF upon d/c.  Currently lives w/daughter.  Goal: Educational Plan Complete Outcome: Progressing Stroke Book given to pt and family. Will review again tomorrow.  Goal: Hemodynamically stable Outcome: Progressing Elevated BP w/stroke - but stable. Goal: Independent mobility/functioning independent or with min Independent mobility/functioning independently or with minimal assistance  Outcome: Not Progressing Pt has R-sided weakness - drift exhibited w/RU and LE.  PT recommends SNF - but pt is not agreeable to this.  Goal: Tolerating diet/TF at goal rate-PEG if inadequate intake Outcome: Progressing Tolerating heart healthy diet. Goal: INR monitor plan established Outcome: Progressing INR was WNL on 9/5.  No order for INR.  Pt taken off of antiplatelet meds due to GI bleed and sig bleeding at fistula site - per dr's note.  Goal: Family and patient agree upon discharge plan Outcome: Not Progressing Plan of Care Progress to Goal:  Pt doesn't agree w/d/c plan to go to SNF.  Goal: PCP appointment made and transportation plan in place Outcome: Not Progressing Will be done at d/c Goal: Other Discharge Outcomes/Goals Outcome: Progressing Pt had dialysis today after not getting it on Monday.  Removed 2L of fluid.  Pt's VSS.  Pt scored 4 on NIHSS.  Pt unable to raise R leg.  No c/o pain.  Good PO intake today.

## 2014-10-06 NOTE — Progress Notes (Signed)
PRE HD   10/06/14 1315  Neurological  Level of Consciousness Alert  Orientation Level Oriented to person;Oriented to place;Disoriented to time;Disoriented to situation  Respiratory  Respiratory Pattern Regular;Unlabored  Chest Assessment Chest expansion symmetrical  Bilateral Breath Sounds Diminished;Clear  Cardiac  Pulse Regular  ECG Monitor Yes  Cardiac Rhythm NSR  Vascular  Edema Right lower extremity;Left lower extremity  Generalized Edema Other (Comment) (NON-PITTING)  RLE Edema Non-pitting  LLE Edema +1  Integumentary  Integumentary (WDL) WDL  Musculoskeletal  Musculoskeletal (WDL) X  Generalized Weakness Yes  Assistive Device BSC;Wheelchair  Gastrointestinal  Bowel Sounds Assessment Active  GU Assessment  Genitourinary (WDL) X  Genitourinary Symptoms Other (Comment) (HD)  Psychosocial  Psychosocial (WDL) X  Patient Behaviors Cooperative;Flat affect;Withdrawn  Needs Expressed Physical  Emotional support given Given to patient

## 2014-10-06 NOTE — Progress Notes (Signed)
Physical Therapy Treatment Patient Details Name: Troy Gallagher MRN: [id number] DOB: 08-19-1945 Today's Date: 10/06/2014    History of Present Illness presented to ER secondary to AMS, decreased responsiveness and R-sided weakness; admitted for TIA/CVA work-up.  MRI positive for L frontal infarct.  Of note, patient with recent hospitalization (discharged 9/4) for elevated troponin, generalized weakness; refused rehab upon discharge.    PT Comments    Patient requiring increased level of assist for all unsupported sitting and transfer attempts this date.  Noted decreased volitional movement in R LE, but question whether result of cognitive impairment (attention, initiation) vs. Physiological weakness. Requiring very heavy, total assist +2 for bed/chair transfer (scoot pivot over level surfaces); limited ability to actively assist. Continued discussion regarding recommendations for STR upon discharge given level of assist currently requiring. Patient agreeable to consider, but unwilling to commit at this time. Treatment frequency upgraded to 7x/week due to definitive diagnosis of new CVA.    Follow Up Recommendations  SNF     Equipment Recommendations       Recommendations for Other Services       Precautions / Restrictions Precautions Precautions: Fall Precaution Comments: No BP L UE (L AVF) Restrictions Weight Bearing Restrictions: No    Mobility  Bed Mobility Overal bed mobility: Needs Assistance Bed Mobility: Supine to Sit     Supine to sit: Mod assist        Transfers Overall transfer level: Needs assistance   Transfers: Lateral/Scoot Transfers Sit to Stand: Total assist;+2 physical assistance         General transfer comment: scoot pivot over level surfaces, total assist +2 for weight shift, trunk control, lift off and lateral movement.  Constant, step by step cuing for patient to initiate all functional activities.  Ambulation/Gait             General  Gait Details: unable/unsafe   Stairs            Wheelchair Mobility    Modified Rankin (Stroke Patients Only)       Balance Overall balance assessment: Needs assistance Sitting-balance support: No upper extremity supported Sitting balance-Leahy Scale: Poor Sitting balance - Comments: progressive R lateral lean, min cuing, min/mod assist for correction to midline.  Moderate elongation of R lateral trunk with downward tilt of R pelvis, excessive WBing R IT noted.                            Cognition Arousal/Alertness: Awake/alert Behavior During Therapy: WFL for tasks assessed/performed Overall Cognitive Status: Difficult to assess (delayed processing, initiation)                      Exercises Other Exercises Other Exercises: Unsupported sitting, participated with L UE reaching in modified D2 extension pattern to promote closure of R lateral trunk and L ant/lateral weight shift.  Mod assist from therapist throughout.  Unable to maintain adequate postural extension with fatigue, requiring cuing in A/P plane to maintain as well Other Exercises: Lateral scooting edge of bed, 2x2 bilat, mod assist. Difficulty with forward weight shift adequate for clearance of buttocks from seating surface.  However, fair co-contraction of R quad/hams and R UE in closed-chain position noted.    General Comments        Pertinent Vitals/Pain Pain Assessment: No/denies pain    Home Living  Prior Function            PT Goals (current goals can now be found in the care plan section) Acute Rehab PT Goals Patient Stated Goal: Patient wants to to go home  PT Goal Formulation: With patient Time For Goal Achievement: 10/19/14 Potential to Achieve Goals: Fair Progress towards PT goals: Progressing toward goals    Frequency  7X/week    PT Plan Current plan remains appropriate;Frequency needs to be updated    Co-evaluation              End of Session Equipment Utilized During Treatment: Gait belt Activity Tolerance: Patient limited by fatigue Patient left: in chair;with call bell/phone within reach;with chair alarm set     Time: 3419-3790 PT Time Calculation (min) (ACUTE ONLY): 33 min  Charges:  $Therapeutic Activity: 8-22 mins $Neuromuscular Re-education: 8-22 mins                    G Codes:      Faaris Arizpe H. Owens Shark, PT, DPT, NCS 10/06/2014, 4:09 PM [phone]

## 2014-10-06 NOTE — Plan of Care (Signed)
Problem: Discharge/Transitional Outcomes Goal: Other Discharge Outcomes/Goals Outcome: Progressing Plan of Care Progress to Goal:   Pt is alert and has some confusion. Pt denies pain. Pt has periods of incontinence. Pt has been resting comfortably during shift. No other signs of distress noted. Will continue to monitor.

## 2014-10-06 NOTE — Consult Note (Signed)
CC: confusion and R sided weakness.   HPI: Troy Gallagher is an 69 y.o. male  who presents with complaint of right hemiparesis. Patient was just discharged from the hospital where he had been admitted for elevated troponin and generalized weakness. 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 Patient's daughter states that he developed an alteration from his baseline mood and functioning.   MRI brain pt has L insular and L frontal infarct. Now on ASA Mental status much improved today  Past Medical History  Diagnosis Date  . Complication of anesthesia   . Hypertension   . Stroke   . GERD (gastroesophageal reflux disease)   . Headache   . Kidney dialysis status [id number]  . HLD (hyperlipidemia)   . Diabetes mellitus, type II   . Seizure   . BPH (benign prostatic hyperplasia)   . COPD (chronic obstructive pulmonary disease)   . PVD (peripheral vascular disease)   . Carotid stenosis   . Adenoma of large intestine   . ESRD on hemodialysis     MWF  . Anxiety   . RLS (restless legs syndrome)     Past Surgical History  Procedure Laterality Date  . Shoulder arthroscopy w/ rotator cuff repair Left 1999  . Neck surgery N/A 10/24/2009    Cervical Diskectomy- C3-4, 4-5, 5-6 anterior disckectomy by Dr. Arnoldo Morale at [facility]  . Av fistula placement Left 2 years  . Colonoscopy N/A 06/14/2014    Procedure: COLONOSCOPY;  Surgeon: Hulen Luster, MD;  Location: [facility] ENDOSCOPY;  Service: Gastroenterology;  Laterality: N/A;  . Carotid endarterectomy Right 06/05/2013    Dr. Delana Meyer  . Tonsillectomy and adenoidectomy  1950  . 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 Repeat in 3 years  . Carotid doppler ultrasound  04/22/2013    85-90% stenosis of the RICA, 60-70% stenosis of left cmmon carotid and origin of left subclavian  . Doppler echocardiography  12/04/2011    Bilateral stenosis 80%  .  Arch aortogram  04/07/2013    85-90% Stenosis origin of right ICA. 65-70% narrowing of left CCA. 50-60% narrowing of left subclavian artery    Family History  Problem Relation Age of Onset  . Stroke Mother   . Prostate cancer Father   . Heart disease Sister     Social History:  reports that he quit smoking about 12 years ago. His smoking use included Cigarettes. He has a 60 pack-year smoking history. He uses smokeless tobacco. He reports that he does not drink alcohol or use illicit drugs.  Allergies  Allergen Reactions  . Sulfur Hives    Medications: I have reviewed the patient's current medications.  ROS: Not able to obtain due to confusion   Physical Examination: Blood pressure 185/51, pulse 70, temperature 98.3 F (36.8 C), temperature source Oral, resp. rate 19, height 5\' 9"  (1.753 m), weight 107.82 kg (237 lb 11.2 oz), SpO2 98 %.   Neurological Examination Mental Status: Alert and oriented to person only. Speech dysarthric.  Cranial Nerves: II: Discs flat bilaterally; Visual fields grossly normal, pupils equal, round, reactive to light and accommodation III,IV, VI: ptosis not present, extra-ocular motions intact bilaterally V,VII: smile symmetric, facial light touch sensation normal bilaterally VIII: hearing normal bilaterally IX,X: gag reflex present XI: bilateral shoulder shrug XII: midline tongue extension Motor: Right : Upper extremity   4/5    Left:  Upper extremity   5/5  Lower extremity   4/5     Lower extremity   5/5 Tone and bulk:normal tone throughout; no atrophy noted Sensory: Pinprick and light touch intact throughout, bilaterally Deep Tendon Reflexes: 1 and symmetric throughout Plantars: Right: downgoing   Left: downgoing Cerebellar: unable to test on R side Gait: not tested.       Laboratory Studies:   Basic Metabolic Panel:  Recent Labs Lab 09/30/14 0005 09/30/14 0516 09/30/14 1633 10/04/14 1924 10/05/14 0542  NA 139 139 138 133*  139  K 3.9 3.6 4.1 4.3 4.2  CL 101 102 101 97* 105  CO2 26 23 21* 23 23  GLUCOSE 102* 92 71 112* 93  BUN 64* 67* 65* 69* 75*  CREATININE 8.38* 8.57*  8.61* 8.56* 7.89* 8.19*  CALCIUM 9.4 9.3 9.6 9.7 9.2  MG  --  1.9  --   --   --   PHOS  --  6.5* 5.5*  --   --     Liver Function Tests:  Recent Labs Lab 09/30/14 0005 09/30/14 1633 10/04/14 1924  AST 18  --  20  ALT 12*  --  18  ALKPHOS 66  --  65  BILITOT 0.3  --  0.4  PROT 7.6  --  7.6  ALBUMIN 3.4* 3.1* 3.3*   No results for input(s): LIPASE, AMYLASE in the last 168 hours. No results for input(s): AMMONIA in the last 168 hours.  CBC:  Recent Labs Lab 09/30/14 0516 09/30/14 1633 10/04/14 1924 10/05/14 0542  WBC 8.8 8.8 9.4 6.5  NEUTROABS  --   --  7.9*  --   HGB 10.1* 9.5* 9.9* 8.8*  HCT 30.3* 29.6* 30.3* 27.0*  MCV 94.9 95.0 95.4 95.2  PLT 319 316 345 311    Cardiac Enzymes:  Recent Labs Lab 09/30/14 0005 09/30/14 0516 09/30/14 1110 10/04/14 1924  TROPONINI 0.04* 0.04* 0.03 0.03    BNP: Invalid input(s): POCBNP  CBG:  Recent Labs Lab 10/05/14 0733 10/05/14 1122 10/05/14 1614 10/06/14 0002 10/06/14 0513  GLUCAP 88 89 83 85 90    Microbiology: Results for orders placed or performed during the hospital encounter of 10/24/09  Surgical pcr screen     Status: None   Collection Time: 10/19/09  2:01 PM  Result Value Ref Range Status   MRSA, PCR NEGATIVE NEGATIVE Final   Staphylococcus aureus  NEGATIVE Final    NEGATIVE        The Xpert SA Assay (FDA approved for NASAL specimens only), is one component of a comprehensive surveillance program.  It is not intended to diagnose infection nor to guide or monitor treatment.    Coagulation Studies:  Recent Labs  10/04/14 1924  LABPROT 14.0  INR 1.06    Urinalysis:   Recent Labs Lab 10/04/14 1925  COLORURINE YELLOW*  LABSPEC 1.015  PHURINE 7.0  GLUCOSEU 50*  HGBUR NEGATIVE  BILIRUBINUR NEGATIVE  KETONESUR NEGATIVE   PROTEINUR >500*  NITRITE NEGATIVE  LEUKOCYTESUR NEGATIVE    Lipid Panel:     Component Value Date/Time   CHOL 151 12/04/2011 0508   TRIG 219* 12/04/2011 0508   HDL 31* 12/04/2011 0508   VLDL 44* 12/04/2011 0508   LDLCALC 76 12/04/2011 0508    HgbA1C:  Lab Results  Component Value Date   HGBA1C 5.5 09/30/2014    Urine Drug Screen:      Component Value Date/Time   LABOPIA POSITIVE* 10/04/2014 1925   LABBENZ POSITIVE*  10/04/2014 1925   AMPHETMU NONE DETECTED 10/04/2014 1925   THCU NONE DETECTED 10/04/2014 1925   LABBARB NONE DETECTED 10/04/2014 1925    Alcohol Level:   Recent Labs Lab 10/04/14 1924  ETH <5    Other results: EKG: normal EKG, normal sinus rhythm, unchanged from previous tracings.  Imaging: Ct Head Wo Contrast  10/04/2014   CLINICAL DATA:  Right-sided facial droop.  Alert and oriented  EXAM: CT HEAD WITHOUT CONTRAST  TECHNIQUE: Contiguous axial images were obtained from the base of the skull through the vertex without intravenous contrast.  COMPARISON:  09/30/2014  FINDINGS: There is no evidence of mass effect, midline shift, or extra-axial fluid collections. There is no evidence of a space-occupying lesion or intracranial hemorrhage. There is no evidence of a cortical-based area of acute infarction. There is generalized cerebral atrophy. There is periventricular white matter low attenuation likely secondary to microangiopathy.  The ventricles and sulci are appropriate for the patient's age. The basal cisterns are patent.  Visualized portions of the orbits are unremarkable. The visualized portions of the paranasal sinuses and mastoid air cells are unremarkable. Cerebrovascular atherosclerotic calcifications are noted.  The osseous structures are unremarkable.  IMPRESSION: 1. No acute intracranial pathology. 2. Chronic microvascular disease and cerebral atrophy.   Electronically Signed   By: Kathreen Devoid   On: 10/04/2014 19:11   Ct Angio Neck W/cm &/or  Wo/cm  10/05/2014   ADDENDUM REPORT: 10/05/2014 16:43  ADDENDUM: Study discussed by telephone with Dr. Fritzi Mandes on 10/05/2014 at 1637 hours.   Electronically Signed   By: Genevie Ann M.D.   On: 10/05/2014 16:43   10/05/2014   CLINICAL DATA:  69 year old male with 24 hours of acute right hemiparesis. Anterior left frontal lobe infarct. Initial encounter.  EXAM: CT ANGIOGRAPHY NECK  TECHNIQUE: Multidetector CT imaging of the neck was performed using the standard protocol during bolus administration of intravenous contrast. Multiplanar CT image reconstructions and MIPs were obtained to evaluate the vascular anatomy. Carotid stenosis measurements (when applicable) are obtained utilizing NASCET criteria, using the distal internal carotid diameter as the denominator.  CONTRAST:  70mL OMNIPAQUE IOHEXOL 350 MG/ML SOLN  COMPARISON:  Brain MRI and intracranial MRA 1500 hours today. CT cervical spine 09/30/2014.  FINDINGS: Motion artifact. Superimposed cervical ACDF hardware streak artifact.  Aortic arch: Advanced calcified atherosclerosis of the aortic arch. Three vessel arch configuration.  Right carotid system: High-grade stenosis suspected at the right CCA origin related to mostly calcified plaque. See series 8, image 84 and series 4, image 172. Intermittent calcified plaque distal to the origin. At the right carotid bifurcation there is circumferential soft plaque no hemodynamically significant stenosis at the right ICA origin. Tortuous mid cervical right ICA with calcified plaque resulting in up to 50 % stenosis with respect to the distal vessel. Negative distal cervical right ICA. Partially visible right ICA siphon with calcified plaque.  Left carotid system: High-grade stenosis suspected at the left CCA origin related to calcified plaque (series 4, image 200). Intermittent calcified plaque without additional left CCA stenosis. At the left carotid bifurcation there is bulky calcified plaque involving the left ICA origin and  bulb. Stenosis up to 50 % with respect to the distal vessel occurs. Distal to the bulb the cervical left ICA is tortuous but otherwise negative to the skullbase. Partially visible left ICA siphon calcified plaque.  Vertebral arteries:  Calcified plaque in the proximal right subclavian artery without hemodynamically significant stenosis. Calcified plaque at the right vertebral artery  origin with moderate to severe stenosis (series 8, image 111). Intermittent right V2 segment calcified plaque. Calcified plaque in the distal right vertebral artery with up to 50 percent stenosis. Right PICA and vertebrobasilar junction are patent.  Confluent calcified plaque in the proximal left subclavian artery with up to 50% stenosis. Calcified plaque at the left vertebral artery origin with mild stenosis (series 8, image 118). Dense calcified plaque in the left V1 segment appears to occlude the left vertebral artery (series 4, image 159). No definite left vertebral artery enhancement distally in the neck. There does appear to be reconstituted enhancement Intracranially from the right side, an the left PICA origin remains patent.  Skeleton: The cervical spine appears stable with postoperative changes from the C3 to the C6 level and severe disc space loss and/or interbody ankylosis at C6-C7. Degenerative changes about the odontoid with ligamentous hypertrophy and sub chondroid cysts again noted. Absent dentition. No acute osseous abnormality identified. Visualized paranasal sinuses and mastoids are clear.  Other neck: No superior mediastinal lymphadenopathy. Mild respiratory motion artifact in the lung apices. Thyroid, larynx, pharynx, parapharyngeal spaces, retropharyngeal space, sublingual space, submandibular glands, and parotid glands are within normal limits. There is hyperdense material in the right gingivobuccal sulcus, may be chewing gum or similar artifact. No cervical lymphadenopathy.  IMPRESSION: 1. Intermittent motion  artifact, and streak artifact from cervical ACDF. 2. 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 It appears to be reconstituted Intracranially from the right side such the the left PICA remains patent. 3. Moderate to severe right vertebral artery origin stenosis. The right vertebral artery remains patent. 4. High-grade stenosis suspected at both common carotid artery origins off the arch. Advanced arch calcified atherosclerosis. 5. Bilateral carotid bifurcation atherosclerosis, but no hemodynamically significant cervical ICA stenosis.  Electronically Signed: By: Genevie Ann M.D. On: 10/05/2014 16:29   Mr Brain Wo Contrast  10/05/2014   CLINICAL DATA:  Right hemiparesis of new onset. 24 hours duration. Mental status changes with confusion.  EXAM: MRI HEAD WITHOUT CONTRAST  MRA HEAD WITHOUT CONTRAST  TECHNIQUE: Multiplanar, multiecho pulse sequences of the brain and surrounding structures were obtained without intravenous contrast. Angiographic images of the head were obtained using MRA technique without contrast.  COMPARISON:  Head CT 10/04/2014  FINDINGS: MRI HEAD FINDINGS  The study suffers from motion degradation. Diffusion imaging shows acute infarction in the left frontal lobe and insular region. Underlying deep white matter also shows involvement. No other areas of acute infarction.  The brainstem and cerebellum are unremarkable. The cerebral hemispheres elsewhere show generalized atrophy with moderate chronic small-vessel ischemic changes throughout the white matter. No evidence of mass lesion, hemorrhage, hydrocephalus or extra-axial collection. No inflammatory sinus disease.  MRA HEAD FINDINGS  Both internal carotid arteries are widely patent into the brain. The anterior and middle cerebral vessels are patent. Because of motion degradation, proximal anterior and middle cerebral vessels are not well evaluated for focal stenosis.  Both vertebral arteries are patent with the right being dominant. No  basilar stenosis. Flow is present in both posterior cerebral arteries.  IMPRESSION: Background pattern of brain atrophy and chronic small vessel ischemic change.  Acute infarction in the left frontal lobe, insular region and subinsular white matter.  No major vessel occlusion. Motion degradation precludes evaluation for subtle stenotic disease. No proximal stenosis strongly suspected at this time.   Electronically Signed   By: Nelson Chimes M.D.   On: 10/05/2014 15:32   US Carotid Bilateral  10/05/2014  CLINICAL DATA:  TIA, hypertension, syncope, previous tobacco abuse  EXAM: BILATERAL CAROTID DUPLEX ULTRASOUND  TECHNIQUE: Pearline Cables scale imaging, color Doppler and duplex ultrasound was performed of bilateral carotid and vertebral arteries in the neck.  COMPARISON:  01/19/2014  REVIEW OF SYSTEMS: Quantification of carotid stenosis is based on velocity parameters that correlate the residual internal carotid diameter with NASCET-based stenosis levels, using the diameter of the distal internal carotid lumen as the denominator for stenosis measurement.  The following velocity measurements were obtained:  PEAK SYSTOLIC/END DIASTOLIC  RIGHT  ICA:                     138/21cm/sec  CCA:                     867/6PP/JKD  SYSTOLIC ICA/CCA RATIO:  0.7  DIASTOLIC ICA/CCA RATIO: 2.5  ECA:                     255cm/sec  LEFT  ICA:                     255/18cm/sec  CCA:                     326/71IW/PYK  SYSTOLIC ICA/CCA RATIO:  9.98  DIASTOLIC ICA/CCA RATIO: .3  ECA:                     277cm/sec  FINDINGS: RIGHT CAROTID ARTERY: Circumferential partially calcified plaque in the mid and distal common carotid carotid artery and bulb without high-grade stenosis. Plaque involves the carotid bifurcation and proximal internal and external carotid arteries. No high-grade stenosis. Elevated peak systolic velocities in the proximal ECA. No focal aliasing on color Doppler interrogation.  RIGHT VERTEBRAL ARTERY:  Normal flow direction and  waveform.  LEFT CAROTID ARTERY: Eccentric partially calcified plaque in the distal common carotid artery and bulb. Plaque at the origin of the external carotid artery with elevated peak systolic velocities distally. Irregular partially calcified plaque in the proximal ICA with elevated peak systolic velocities and some focal aliasing on color Doppler interrogation. Waveforms in the distal IC unremarkable.  LEFT VERTEBRAL ARTERY: Normal flow direction and waveform.  IMPRESSION: 1. Bilateral carotid bifurcation and proximal ICA plaque, resulting in 50-69% diameter stenosis on the right, 70-99% diameter stenosis on the left. 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 Consider vascular surgical or neurointerventional radiology consultation.   Electronically Signed   By: Lucrezia Europe M.D.   On: 10/05/2014 10:09   Mr Lovenia Kim  10/05/2014   CLINICAL DATA:  Right hemiparesis of new onset. 24 hours duration. Mental status changes with confusion.  EXAM: MRI HEAD WITHOUT CONTRAST  MRA HEAD WITHOUT CONTRAST  TECHNIQUE: Multiplanar, multiecho pulse sequences of the brain and surrounding structures were obtained without intravenous contrast. Angiographic images of the head were obtained using MRA technique without contrast.  COMPARISON:  Head CT 10/04/2014  FINDINGS: MRI HEAD FINDINGS  The study suffers from motion degradation. Diffusion imaging shows acute infarction in the left frontal lobe and insular region. Underlying deep white matter also shows involvement. No other areas of acute infarction.  The brainstem and cerebellum are unremarkable. The cerebral hemispheres elsewhere show generalized atrophy with moderate chronic small-vessel ischemic changes throughout the white matter. No evidence of mass lesion, hemorrhage, hydrocephalus or extra-axial collection. No inflammatory sinus disease.  MRA HEAD FINDINGS  Both internal carotid arteries are widely patent into  the brain. The anterior and middle cerebral vessels  are patent. Because of motion degradation, proximal anterior and middle cerebral vessels are not well evaluated for focal stenosis.  Both vertebral arteries are patent with the right being dominant. No basilar stenosis. Flow is present in both posterior cerebral arteries.  IMPRESSION: Background pattern of brain atrophy and chronic small vessel ischemic change.  Acute infarction in the left frontal lobe, insular region and subinsular white matter.  No major vessel occlusion. Motion degradation precludes evaluation for subtle stenotic disease. No proximal stenosis strongly suspected at this time.   Electronically Signed   By: Nelson Chimes M.D.   On: 10/05/2014 15:32     Assessment/Plan: 69 y.o. male  who presents with complaint of right hemiparesis. Patient was just discharged from the hospital where he had been admitted for elevated troponin and generalized weakness. 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 Patient's daughter states that he developed an alteration from his baseline mood and functioning.    L insular and L frontal stroke. MRA of head appears not to have any significant intracranial stenosis.   Mental status improved, as well as strenth pt was not on anti platelet therapy prior ASA started Would wait for out for carotid endarterectomy D/c planning to rehab   10/06/2014, 7:40 AM

## 2014-10-06 NOTE — Progress Notes (Signed)
[facility]  ROUNDING NOTE   Subjective:   Left frontal lobe acute infarct on MRI.  CT angio with carotid stenosis Hemodialysis for later today.   Objective:  Vital signs in last 24 hours:  Temp:  [98.3 F (36.8 C)-98.9 F (37.2 C)] 98.3 F (36.8 C) (09/07 0639) Pulse Rate:  [70-78] 70 (09/07 0639) Resp:  [17-19] 19 (09/07 0639) BP: (171-185)/(51-85) 185/51 mmHg (09/07 0639) SpO2:  [95 %-98 %] 98 % (09/07 0639)  Weight change:  Filed Weights   10/04/14 1828 10/04/14 2348  Weight: 106.142 kg (234 lb) 107.82 kg (237 lb 11.2 oz)    Intake/Output: I/O last 3 completed shifts: In: 480 [P.O.:480] Out: 500 [Urine:500]   Intake/Output this shift:     Physical Exam: General: NAD, laying in bed  Head: Normocephalic, atraumatic. Moist oral mucosal membranes  Eyes: Anicteric, PERRL  Neck: Supple, trachea midline  Lungs:  Clear to auscultation normal effort  Heart: Regular rate and rhythm  Abdomen:  Soft, nontender, BS present  Extremities:  1+ peripheral edema.  Neurologic: Nonfocal, moving all four extremities, right sided weakness  Skin: No lesions     Access: LUE AVF    Basic Metabolic Panel:  Recent Labs Lab 09/30/14 0005 09/30/14 0516 09/30/14 1633 10/04/14 1924 10/05/14 0542  NA 139 139 138 133* 139  K 3.9 3.6 4.1 4.3 4.2  CL 101 102 101 97* 105  CO2 26 23 21* 23 23  GLUCOSE 102* 92 71 112* 93  BUN 64* 67* 65* 69* 75*  CREATININE 8.38* 8.57*  8.61* 8.56* 7.89* 8.19*  CALCIUM 9.4 9.3 9.6 9.7 9.2  MG  --  1.9  --   --   --   PHOS  --  6.5* 5.5*  --   --     Liver Function Tests:  Recent Labs Lab 09/30/14 0005 09/30/14 1633 10/04/14 1924  AST 18  --  20  ALT 12*  --  18  ALKPHOS 66  --  65  BILITOT 0.3  --  0.4  PROT 7.6  --  7.6  ALBUMIN 3.4* 3.1* 3.3*   No results for input(s): LIPASE, AMYLASE in the last 168 hours. No results for input(s): AMMONIA in the last 168 hours.  CBC:  Recent Labs Lab 09/30/14 0516  09/30/14 1633 10/04/14 1924 10/05/14 0542  WBC 8.8 8.8 9.4 6.5  NEUTROABS  --   --  7.9*  --   HGB 10.1* 9.5* 9.9* 8.8*  HCT 30.3* 29.6* 30.3* 27.0*  MCV 94.9 95.0 95.4 95.2  PLT 319 316 345 311    Cardiac Enzymes:  Recent Labs Lab 09/30/14 0005 09/30/14 0516 09/30/14 1110 10/04/14 1924  TROPONINI 0.04* 0.04* 0.03 0.03    BNP: Invalid input(s): POCBNP  CBG:  Recent Labs Lab 10/05/14 0733 10/05/14 1122 10/05/14 1614 10/06/14 0002 10/06/14 0513  GLUCAP 88 89 83 85 90    Microbiology: Results for orders placed or performed during the hospital encounter of 10/24/09  Surgical pcr screen     Status: None   Collection Time: 10/19/09  2:01 PM  Result Value Ref Range Status   MRSA, PCR NEGATIVE NEGATIVE Final   Staphylococcus aureus  NEGATIVE Final    NEGATIVE        The Xpert SA Assay (FDA approved for NASAL specimens only), is one component of a comprehensive surveillance program.  It is not intended to diagnose infection nor to guide or monitor treatment.    Coagulation Studies:  Recent Labs  10/04/14 1924  LABPROT 14.0  INR 1.06    Urinalysis:  Recent Labs  10/04/14 1925  COLORURINE YELLOW*  LABSPEC 1.015  PHURINE 7.0  GLUCOSEU 50*  HGBUR NEGATIVE  BILIRUBINUR NEGATIVE  KETONESUR NEGATIVE  PROTEINUR >500*  NITRITE NEGATIVE  LEUKOCYTESUR NEGATIVE      Imaging: Ct Head Wo Contrast  10/04/2014   CLINICAL DATA:  Right-sided facial droop.  Alert and oriented  EXAM: CT HEAD WITHOUT CONTRAST  TECHNIQUE: Contiguous axial images were obtained from the base of the skull through the vertex without intravenous contrast.  COMPARISON:  09/30/2014  FINDINGS: There is no evidence of mass effect, midline shift, or extra-axial fluid collections. There is no evidence of a space-occupying lesion or intracranial hemorrhage. There is no evidence of a cortical-based area of acute infarction. There is generalized cerebral atrophy. There is periventricular white  matter low attenuation likely secondary to microangiopathy.  The ventricles and sulci are appropriate for the patient's age. The basal cisterns are patent.  Visualized portions of the orbits are unremarkable. The visualized portions of the paranasal sinuses and mastoid air cells are unremarkable. Cerebrovascular atherosclerotic calcifications are noted.  The osseous structures are unremarkable.  IMPRESSION: 1. No acute intracranial pathology. 2. Chronic microvascular disease and cerebral atrophy.   Electronically Signed   By: Kathreen Devoid   On: 10/04/2014 19:11   Ct Angio Neck W/cm &/or Wo/cm  10/05/2014   ADDENDUM REPORT: 10/05/2014 16:43  ADDENDUM: Study discussed by telephone with Dr. Fritzi Mandes on 10/05/2014 at 1637 hours.   Electronically Signed   By: Genevie Ann M.D.   On: 10/05/2014 16:43   10/05/2014   CLINICAL DATA:  69 year old male with 24 hours of acute right hemiparesis. Anterior left frontal lobe infarct. Initial encounter.  EXAM: CT ANGIOGRAPHY NECK  TECHNIQUE: Multidetector CT imaging of the neck was performed using the standard protocol during bolus administration of intravenous contrast. Multiplanar CT image reconstructions and MIPs were obtained to evaluate the vascular anatomy. Carotid stenosis measurements (when applicable) are obtained utilizing NASCET criteria, using the distal internal carotid diameter as the denominator.  CONTRAST:  44mL OMNIPAQUE IOHEXOL 350 MG/ML SOLN  COMPARISON:  Brain MRI and intracranial MRA 1500 hours today. CT cervical spine 09/30/2014.  FINDINGS: Motion artifact. Superimposed cervical ACDF hardware streak artifact.  Aortic arch: Advanced calcified atherosclerosis of the aortic arch. Three vessel arch configuration.  Right carotid system: High-grade stenosis suspected at the right CCA origin related to mostly calcified plaque. See series 8, image 84 and series 4, image 172. Intermittent calcified plaque distal to the origin. At the right carotid bifurcation there is  circumferential soft plaque no hemodynamically significant stenosis at the right ICA origin. Tortuous mid cervical right ICA with calcified plaque resulting in up to 50 % stenosis with respect to the distal vessel. Negative distal cervical right ICA. Partially visible right ICA siphon with calcified plaque.  Left carotid system: High-grade stenosis suspected at the left CCA origin related to calcified plaque (series 4, image 200). Intermittent calcified plaque without additional left CCA stenosis. At the left carotid bifurcation there is bulky calcified plaque involving the left ICA origin and bulb. Stenosis up to 50 % with respect to the distal vessel occurs. Distal to the bulb the cervical left ICA is tortuous but otherwise negative to the skullbase. Partially visible left ICA siphon calcified plaque.  Vertebral arteries:  Calcified plaque in the proximal right subclavian artery without hemodynamically significant stenosis. Calcified plaque at the right  vertebral artery origin with moderate to severe stenosis (series 8, image 111). Intermittent right V2 segment calcified plaque. Calcified plaque in the distal right vertebral artery with up to 50 percent stenosis. Right PICA and vertebrobasilar junction are patent.  Confluent calcified plaque in the proximal left subclavian artery with up to 50% stenosis. Calcified plaque at the left vertebral artery origin with mild stenosis (series 8, image 118). Dense calcified plaque in the left V1 segment appears to occlude the left vertebral artery (series 4, image 159). No definite left vertebral artery enhancement distally in the neck. There does appear to be reconstituted enhancement Intracranially from the right side, an the left PICA origin remains patent.  Skeleton: The cervical spine appears stable with postoperative changes from the C3 to the C6 level and severe disc space loss and/or interbody ankylosis at C6-C7. Degenerative changes about the odontoid with  ligamentous hypertrophy and sub chondroid cysts again noted. Absent dentition. No acute osseous abnormality identified. Visualized paranasal sinuses and mastoids are clear.  Other neck: No superior mediastinal lymphadenopathy. Mild respiratory motion artifact in the lung apices. Thyroid, larynx, pharynx, parapharyngeal spaces, retropharyngeal space, sublingual space, submandibular glands, and parotid glands are within normal limits. There is hyperdense material in the right gingivobuccal sulcus, may be chewing gum or similar artifact. No cervical lymphadenopathy.  IMPRESSION: 1. Intermittent motion artifact, and streak artifact from cervical ACDF. 2. 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 It appears to be reconstituted Intracranially from the right side such the the left PICA remains patent. 3. Moderate to severe right vertebral artery origin stenosis. The right vertebral artery remains patent. 4. High-grade stenosis suspected at both common carotid artery origins off the arch. Advanced arch calcified atherosclerosis. 5. Bilateral carotid bifurcation atherosclerosis, but no hemodynamically significant cervical ICA stenosis.  Electronically Signed: By: Genevie Ann M.D. On: 10/05/2014 16:29   Mr Brain Wo Contrast  10/05/2014   CLINICAL DATA:  Right hemiparesis of new onset. 24 hours duration. Mental status changes with confusion.  EXAM: MRI HEAD WITHOUT CONTRAST  MRA HEAD WITHOUT CONTRAST  TECHNIQUE: Multiplanar, multiecho pulse sequences of the brain and surrounding structures were obtained without intravenous contrast. Angiographic images of the head were obtained using MRA technique without contrast.  COMPARISON:  Head CT 10/04/2014  FINDINGS: MRI HEAD FINDINGS  The study suffers from motion degradation. Diffusion imaging shows acute infarction in the left frontal lobe and insular region. Underlying deep white matter also shows involvement. No other areas of acute infarction.  The brainstem and  cerebellum are unremarkable. The cerebral hemispheres elsewhere show generalized atrophy with moderate chronic small-vessel ischemic changes throughout the white matter. No evidence of mass lesion, hemorrhage, hydrocephalus or extra-axial collection. No inflammatory sinus disease.  MRA HEAD FINDINGS  Both internal carotid arteries are widely patent into the brain. The anterior and middle cerebral vessels are patent. Because of motion degradation, proximal anterior and middle cerebral vessels are not well evaluated for focal stenosis.  Both vertebral arteries are patent with the right being dominant. No basilar stenosis. Flow is present in both posterior cerebral arteries.  IMPRESSION: Background pattern of brain atrophy and chronic small vessel ischemic change.  Acute infarction in the left frontal lobe, insular region and subinsular white matter.  No major vessel occlusion. Motion degradation precludes evaluation for subtle stenotic disease. No proximal stenosis strongly suspected at this time.   Electronically Signed   By: Nelson Chimes M.D.   On: 10/05/2014 15:32   US Carotid Bilateral  10/05/2014   CLINICAL DATA:  TIA, hypertension, syncope, previous tobacco abuse  EXAM: BILATERAL CAROTID DUPLEX ULTRASOUND  TECHNIQUE: Pearline Cables scale imaging, color Doppler and duplex ultrasound was performed of bilateral carotid and vertebral arteries in the neck.  COMPARISON:  01/19/2014  REVIEW OF SYSTEMS: Quantification of carotid stenosis is based on velocity parameters that correlate the residual internal carotid diameter with NASCET-based stenosis levels, using the diameter of the distal internal carotid lumen as the denominator for stenosis measurement.  The following velocity measurements were obtained:  PEAK SYSTOLIC/END DIASTOLIC  RIGHT  ICA:                     138/21cm/sec  CCA:                     093/2IZ/TIW  SYSTOLIC ICA/CCA RATIO:  0.7  DIASTOLIC ICA/CCA RATIO: 2.5  ECA:                     255cm/sec  LEFT  ICA:                      255/18cm/sec  CCA:                     580/99IP/JAS  SYSTOLIC ICA/CCA RATIO:  5.05  DIASTOLIC ICA/CCA RATIO: .3  ECA:                     277cm/sec  FINDINGS: RIGHT CAROTID ARTERY: Circumferential partially calcified plaque in the mid and distal common carotid carotid artery and bulb without high-grade stenosis. Plaque involves the carotid bifurcation and proximal internal and external carotid arteries. No high-grade stenosis. Elevated peak systolic velocities in the proximal ECA. No focal aliasing on color Doppler interrogation.  RIGHT VERTEBRAL ARTERY:  Normal flow direction and waveform.  LEFT CAROTID ARTERY: Eccentric partially calcified plaque in the distal common carotid artery and bulb. Plaque at the origin of the external carotid artery with elevated peak systolic velocities distally. Irregular partially calcified plaque in the proximal ICA with elevated peak systolic velocities and some focal aliasing on color Doppler interrogation. Waveforms in the distal IC unremarkable.  LEFT VERTEBRAL ARTERY: Normal flow direction and waveform.  IMPRESSION: 1. Bilateral carotid bifurcation and proximal ICA plaque, resulting in 50-69% diameter stenosis on the right, 70-99% diameter stenosis on the left. 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 Consider vascular surgical or neurointerventional radiology consultation.   Electronically Signed   By: Lucrezia Europe M.D.   On: 10/05/2014 10:09   Mr Lovenia Kim  10/05/2014   CLINICAL DATA:  Right hemiparesis of new onset. 24 hours duration. Mental status changes with confusion.  EXAM: MRI HEAD WITHOUT CONTRAST  MRA HEAD WITHOUT CONTRAST  TECHNIQUE: Multiplanar, multiecho pulse sequences of the brain and surrounding structures were obtained without intravenous contrast. Angiographic images of the head were obtained using MRA technique without contrast.  COMPARISON:  Head CT 10/04/2014  FINDINGS: MRI HEAD FINDINGS  The study suffers from motion  degradation. Diffusion imaging shows acute infarction in the left frontal lobe and insular region. Underlying deep white matter also shows involvement. No other areas of acute infarction.  The brainstem and cerebellum are unremarkable. The cerebral hemispheres elsewhere show generalized atrophy with moderate chronic small-vessel ischemic changes throughout the white matter. No evidence of mass lesion, hemorrhage, hydrocephalus or extra-axial collection. No inflammatory sinus disease.  MRA HEAD FINDINGS  Both internal carotid arteries  are widely patent into the brain. The anterior and middle cerebral vessels are patent. Because of motion degradation, proximal anterior and middle cerebral vessels are not well evaluated for focal stenosis.  Both vertebral arteries are patent with the right being dominant. No basilar stenosis. Flow is present in both posterior cerebral arteries.  IMPRESSION: Background pattern of brain atrophy and chronic small vessel ischemic change.  Acute infarction in the left frontal lobe, insular region and subinsular white matter.  No major vessel occlusion. Motion degradation precludes evaluation for subtle stenotic disease. No proximal stenosis strongly suspected at this time.   Electronically Signed   By: Nelson Chimes M.D.   On: 10/05/2014 15:32     Medications:     . ALPRAZolam  0.5 mg Oral QHS  . aspirin  325 mg Oral Daily  . atorvastatin  40 mg Oral QPM  . cinacalcet  30 mg Oral QPM  . cycloSPORINE  1 drop Both Eyes BID  . epoetin (EPOGEN/PROCRIT) injection  10,000 Units Intravenous Once  . heparin  5,000 Units Subcutaneous 3 times per day  . insulin aspart  0-9 Units Subcutaneous Q6H  . pantoprazole  40 mg Oral BID  . rOPINIRole  2 mg Oral QHS  . sevelamer carbonate  1,600 mg Oral TID WC   acetaminophen **OR** acetaminophen, ipratropium-albuterol, ondansetron **OR** ondansetron (ZOFRAN) IV, polyvinyl alcohol  Assessment/ Plan:  69 y.o. male  with end-stage renal  disease on hemodialysis Monday, Wednesday, Friday, hypertension, COPD, GERD, gout, left upper extremity AV fistula, restless leg syndrome, hyperlipidemia who was admitted for generalized weakness.  1.  End-stage renal disease on hemodialysis Monday, Wednesday, Friday. - next treatment for later today. Orders prepared.   2. Anemia chronic kidney disease.  Hgb 8.8, hold epo due to acute ischemic event.   3.  Secondary hyperparathyroidism. Phos 5.5. PTH as outpatient of 439.  - sevelamer with meals.  - Continue cinacalcet   4.  Hypertension:  Decent control.  - not on oral hypertensive agents currently.    LOSJuleen China, Lurena Nida 9/7/201611:08 AM

## 2014-10-06 NOTE — Care Management Important Message (Signed)
Important Message  Patient Details  Name: LORENE SAMAAN MRN: [id number] Date of Birth: 1945/04/03   Medicare Important Message Given:  Yes-second notification given    Juliann Pulse A Allmond 10/06/2014, 11:25 AM

## 2014-10-06 NOTE — Clinical Social Work Placement (Signed)
   CLINICAL SOCIAL WORK PLACEMENT  NOTE  Date:  10/06/2014  Patient Details  Name: Troy Gallagher MRN: [id number] Date of Birth: [birth year]  Clinical Social Work is seeking post-discharge placement for this patient at the [place] level of care (*CSW will initial, date and re-position this form in  chart as items are completed):  Yes   Patient/family provided with [facility] Work Department's list of facilities offering this level of care within the geographic area requested by the patient (or if unable, by the patient's family).  Yes   Patient/family informed of their freedom to choose among providers that offer the needed level of care, that participate in Medicare, Medicaid or managed care program needed by the patient, have an available bed and are willing to accept the patient.  Yes   Patient/family informed of [facility]'s ownership interest in [facility] and [facility], as well as of the fact that they are under no obligation to receive care at these facilities.  PASRR submitted to EDS on       PASRR number received on       Existing PASRR number confirmed on 10/06/14     FL2 transmitted to all facilities in geographic area requested by pt/family on 10/06/14     FL2 transmitted to all facilities within larger geographic area on       Patient informed that his/her managed care company has contracts with or will negotiate with certain facilities, including the following:        Yes   Patient/family informed of bed offers received.  Patient chooses bed at  [facility] )     Physician recommends and patient chooses bed at      Patient to be transferred to   on  .  Patient to be transferred to facility by       Patient family notified on   of transfer.  Name of family member notified:        PHYSICIAN Please sign FL2     Additional Comment:    _______________________________________________ Loralyn Freshwater,  LCSW 10/06/2014, 2:46 PM

## 2014-10-06 NOTE — Progress Notes (Signed)
POST HD   10/06/14 1645  Neurological  Level of Consciousness Alert  Orientation Level Oriented to person;Oriented to place;Disoriented to time;Disoriented to situation  Respiratory  Respiratory Pattern Regular;Unlabored  Chest Assessment Chest expansion symmetrical  Bilateral Breath Sounds Diminished;Clear  Cardiac  Pulse Regular  ECG Monitor Yes  Cardiac Rhythm NSR  Vascular  Edema Right lower extremity;Left lower extremity  Generalized Edema (NON PITTING)  RLE Edema Non-pitting  LLE Edema Non-Pitting  Integumentary  Integumentary (WDL) WDL  Musculoskeletal  Musculoskeletal (WDL) X  Generalized Weakness Yes  Assistive Device BSC;Wheelchair  GU Assessment  Genitourinary (WDL) X  Genitourinary Symptoms (HD)  Urine Characteristics  Urine Color Yellow/straw  Urine Appearance Clear  Psychosocial  Psychosocial (WDL) X  Patient Behaviors Cooperative;Flat affect;Withdrawn  Needs Expressed Physical  Emotional support given Given to patient

## 2014-10-06 NOTE — Progress Notes (Signed)
POST HD    10/06/14 1630  Vital Signs  Temp 98.2 F (36.8 C)  Temp Source Oral  Pulse Rate 70  Pulse Rate Source Monitor  Resp 16  BP (!) 162/72 mmHg  BP Location Right Arm  BP Method Automatic  Patient Position (if appropriate) Lying  Dialysis Weight  Weight 102.1 kg (225 lb 1.4 oz)  Type of Weight Post-Dialysis  During Hemodialysis Assessment  Intra-Hemodialysis Comments HD TREATMENT END.  BLOOD RETURNED AND 15G NEEDLES REMOVED PER POLICY.  PT ALERT AND VS STABLE.    Post-Hemodialysis Assessment  Rinseback Volume (mL) 250 mL  Dialyzer Clearance Lightly streaked  Duration of HD Treatment -hour(s) 3 hour(s)  Hemodialysis Intake (mL) 500 mL  UF Total -Machine (mL) 2500 mL  Net UF (mL) 2000 mL  Tolerated HD Treatment Yes  Post-Hemodialysis Comments HD TREATMENT END.  BLOOD RETURNED AND 15G NEEDLES REMOVED PER POLICY.  PT ALERT AND VS STABLE.    AVG/AVF Arterial Site Held (minutes) 7 minutes  AVG/AVF Venous Site Held (minutes) 7 minutes  Education / Care Plan  Hemodialysis Education Provided Yes  Documented Education in Clinical Pathway Yes  Fistula / Graft Left Upper arm Arteriovenous vein graft  No Placement Date or Time found.   Placed prior to admission: Yes  Orientation: Left  Access Location: Upper arm  Access Type: Arteriovenous vein graft  Site Condition No complications  Fistula / Graft Assessment Present;Thrill;Bruit  Status Deaccessed  Needle Size 15  Drainage Description None

## 2014-10-06 NOTE — Progress Notes (Signed)
[facility] [place] NAME: Troy Gallagher    MR#:  [id number]  DATE OF BIRTH:  11/06/1945  SUBJECTIVE:  Came in with right UE weakness C/o dry eyes. Right UE weakness much improved  REVIEW OF SYSTEMS:   Review of Systems  Constitutional: Negative for fever, chills and weight loss.  HENT: Negative for ear discharge, ear pain and nosebleeds.   Eyes: Negative for blurred vision, pain and discharge.  Respiratory: Negative for sputum production, shortness of breath, wheezing and stridor.   Cardiovascular: Negative for chest pain, palpitations, orthopnea and PND.  Gastrointestinal: Negative for nausea, vomiting, abdominal pain and diarrhea.  Genitourinary: Negative for urgency and frequency.  Musculoskeletal: Negative for back pain and joint pain.  Neurological: Positive for focal weakness and weakness. Negative for sensory change and speech change.  Psychiatric/Behavioral: Negative for depression. The patient is not nervous/anxious.   All other systems reviewed and are negative.  Tolerating Diet:yes Tolerating PT: rec rehab  DRUG ALLERGIES:   Allergies  Allergen Reactions  . Sulfur Hives    VITALS:  Blood pressure 185/51, pulse 70, temperature 98.3 F (36.8 C), temperature source Oral, resp. rate 19, height 5\' 9"  (1.753 m), weight 107.82 kg (237 lb 11.2 oz), SpO2 98 %.  PHYSICAL EXAMINATION:   Physical Exam  GENERAL:  69 y.o.-year-old patient lying in the bed with no acute distress. Obese EYES: Pupils equal, round, reactive to light and accommodation. No scleral icterus. Extraocular muscles intact.  HEENT: Head atraumatic, normocephalic. Oropharynx and nasopharynx clear.  NECK:  Supple, no jugular venous distention. No thyroid enlargement, no tenderness.  LUNGS: Normal breath sounds bilaterally, no wheezing, rales, rhonchi. No use of accessory muscles of respiration.  CARDIOVASCULAR: S1, S2 normal. No murmurs, rubs, or gallops.   ABDOMEN: Soft, nontender, nondistended. Bowel sounds present. No organomegaly or mass.  EXTREMITIES: No cyanosis, clubbing or edema b/l.    NEUROLOGIC: Cranial nerves II through XII are intact. Right UE 4/5 weakness+  No sensory deficit PSYCHIATRIC: The patient is alert and oriented x 3.  SKIN: No obvious rash, lesion, or ulcer.    LABORATORY PANEL:   CBC  Recent Labs Lab 10/05/14 0542  WBC 6.5  HGB 8.8*  HCT 27.0*  PLT 311    Chemistries   Recent Labs Lab 09/30/14 0516  10/04/14 1924 10/05/14 0542  NA 139  < > 133* 139  K 3.6  < > 4.3 4.2  CL 102  < > 97* 105  CO2 23  < > 23 23  GLUCOSE 92  < > 112* 93  BUN 67*  < > 69* 75*  CREATININE 8.57*  8.61*  < > 7.89* 8.19*  CALCIUM 9.3  < > 9.7 9.2  MG 1.9  --   --   --   AST  --   --  20  --   ALT  --   --  18  --   ALKPHOS  --   --  65  --   BILITOT  --   --  0.4  --   < > = values in this interval not displayed.  Cardiac Enzymes  Recent Labs Lab 10/04/14 1924  TROPONINI 0.03    RADIOLOGY:  Ct Head Wo Contrast  10/04/2014   CLINICAL DATA:  Right-sided facial droop.  Alert and oriented  EXAM: CT HEAD WITHOUT CONTRAST  TECHNIQUE: Contiguous axial images were obtained from the base of the skull through the vertex without  intravenous contrast.  COMPARISON:  09/30/2014  FINDINGS: There is no evidence of mass effect, midline shift, or extra-axial fluid collections. There is no evidence of a space-occupying lesion or intracranial hemorrhage. There is no evidence of a cortical-based area of acute infarction. There is generalized cerebral atrophy. There is periventricular white matter low attenuation likely secondary to microangiopathy.  The ventricles and sulci are appropriate for the patient's age. The basal cisterns are patent.  Visualized portions of the orbits are unremarkable. The visualized portions of the paranasal sinuses and mastoid air cells are unremarkable. Cerebrovascular atherosclerotic calcifications are noted.   The osseous structures are unremarkable.  IMPRESSION: 1. No acute intracranial pathology. 2. Chronic microvascular disease and cerebral atrophy.   Electronically Signed   By: Kathreen Devoid   On: 10/04/2014 19:11   Ct Angio Neck W/cm &/or Wo/cm  10/05/2014   ADDENDUM REPORT: 10/05/2014 16:43  ADDENDUM: Study discussed by telephone with Dr. Fritzi Mandes on 10/05/2014 at 1637 hours.   Electronically Signed   By: Genevie Ann M.D.   On: 10/05/2014 16:43   10/05/2014   CLINICAL DATA:  69 year old male with 24 hours of acute right hemiparesis. Anterior left frontal lobe infarct. Initial encounter.  EXAM: CT ANGIOGRAPHY NECK  TECHNIQUE: Multidetector CT imaging of the neck was performed using the standard protocol during bolus administration of intravenous contrast. Multiplanar CT image reconstructions and MIPs were obtained to evaluate the vascular anatomy. Carotid stenosis measurements (when applicable) are obtained utilizing NASCET criteria, using the distal internal carotid diameter as the denominator.  CONTRAST:  27mL OMNIPAQUE IOHEXOL 350 MG/ML SOLN  COMPARISON:  Brain MRI and intracranial MRA 1500 hours today. CT cervical spine 09/30/2014.  FINDINGS: Motion artifact. Superimposed cervical ACDF hardware streak artifact.  Aortic arch: Advanced calcified atherosclerosis of the aortic arch. Three vessel arch configuration.  Right carotid system: High-grade stenosis suspected at the right CCA origin related to mostly calcified plaque. See series 8, image 84 and series 4, image 172. Intermittent calcified plaque distal to the origin. At the right carotid bifurcation there is circumferential soft plaque no hemodynamically significant stenosis at the right ICA origin. Tortuous mid cervical right ICA with calcified plaque resulting in up to 50 % stenosis with respect to the distal vessel. Negative distal cervical right ICA. Partially visible right ICA siphon with calcified plaque.  Left carotid system: High-grade stenosis suspected  at the left CCA origin related to calcified plaque (series 4, image 200). Intermittent calcified plaque without additional left CCA stenosis. At the left carotid bifurcation there is bulky calcified plaque involving the left ICA origin and bulb. Stenosis up to 50 % with respect to the distal vessel occurs. Distal to the bulb the cervical left ICA is tortuous but otherwise negative to the skullbase. Partially visible left ICA siphon calcified plaque.  Vertebral arteries:  Calcified plaque in the proximal right subclavian artery without hemodynamically significant stenosis. Calcified plaque at the right vertebral artery origin with moderate to severe stenosis (series 8, image 111). Intermittent right V2 segment calcified plaque. Calcified plaque in the distal right vertebral artery with up to 50 percent stenosis. Right PICA and vertebrobasilar junction are patent.  Confluent calcified plaque in the proximal left subclavian artery with up to 50% stenosis. Calcified plaque at the left vertebral artery origin with mild stenosis (series 8, image 118). Dense calcified plaque in the left V1 segment appears to occlude the left vertebral artery (series 4, image 159). No definite left vertebral artery enhancement distally in the neck.  There does appear to be reconstituted enhancement Intracranially from the right side, an the left PICA origin remains patent.  Skeleton: The cervical spine appears stable with postoperative changes from the C3 to the C6 level and severe disc space loss and/or interbody ankylosis at C6-C7. Degenerative changes about the odontoid with ligamentous hypertrophy and sub chondroid cysts again noted. Absent dentition. No acute osseous abnormality identified. Visualized paranasal sinuses and mastoids are clear.  Other neck: No superior mediastinal lymphadenopathy. Mild respiratory motion artifact in the lung apices. Thyroid, larynx, pharynx, parapharyngeal spaces, retropharyngeal space, sublingual space,  submandibular glands, and parotid glands are within normal limits. There is hyperdense material in the right gingivobuccal sulcus, may be chewing gum or similar artifact. No cervical lymphadenopathy.  IMPRESSION: 1. Intermittent motion artifact, and streak artifact from cervical ACDF. 2. 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 It appears to be reconstituted Intracranially from the right side such the the left PICA remains patent. 3. Moderate to severe right vertebral artery origin stenosis. The right vertebral artery remains patent. 4. High-grade stenosis suspected at both common carotid artery origins off the arch. Advanced arch calcified atherosclerosis. 5. Bilateral carotid bifurcation atherosclerosis, but no hemodynamically significant cervical ICA stenosis.  Electronically Signed: By: Genevie Ann M.D. On: 10/05/2014 16:29   Mr Brain Wo Contrast  10/05/2014   CLINICAL DATA:  Right hemiparesis of new onset. 24 hours duration. Mental status changes with confusion.  EXAM: MRI HEAD WITHOUT CONTRAST  MRA HEAD WITHOUT CONTRAST  TECHNIQUE: Multiplanar, multiecho pulse sequences of the brain and surrounding structures were obtained without intravenous contrast. Angiographic images of the head were obtained using MRA technique without contrast.  COMPARISON:  Head CT 10/04/2014  FINDINGS: MRI HEAD FINDINGS  The study suffers from motion degradation. Diffusion imaging shows acute infarction in the left frontal lobe and insular region. Underlying deep white matter also shows involvement. No other areas of acute infarction.  The brainstem and cerebellum are unremarkable. The cerebral hemispheres elsewhere show generalized atrophy with moderate chronic small-vessel ischemic changes throughout the white matter. No evidence of mass lesion, hemorrhage, hydrocephalus or extra-axial collection. No inflammatory sinus disease.  MRA HEAD FINDINGS  Both internal carotid arteries are widely patent into the brain. The  anterior and middle cerebral vessels are patent. Because of motion degradation, proximal anterior and middle cerebral vessels are not well evaluated for focal stenosis.  Both vertebral arteries are patent with the right being dominant. No basilar stenosis. Flow is present in both posterior cerebral arteries.  IMPRESSION: Background pattern of brain atrophy and chronic small vessel ischemic change.  Acute infarction in the left frontal lobe, insular region and subinsular white matter.  No major vessel occlusion. Motion degradation precludes evaluation for subtle stenotic disease. No proximal stenosis strongly suspected at this time.   Electronically Signed   By: Nelson Chimes M.D.   On: 10/05/2014 15:32   US Carotid Bilateral  10/05/2014   CLINICAL DATA:  TIA, hypertension, syncope, previous tobacco abuse  EXAM: BILATERAL CAROTID DUPLEX ULTRASOUND  TECHNIQUE: Pearline Cables scale imaging, color Doppler and duplex ultrasound was performed of bilateral carotid and vertebral arteries in the neck.  COMPARISON:  01/19/2014  REVIEW OF SYSTEMS: Quantification of carotid stenosis is based on velocity parameters that correlate the residual internal carotid diameter with NASCET-based stenosis levels, using the diameter of the distal internal carotid lumen as the denominator for stenosis measurement.  The following velocity measurements were obtained:  PEAK SYSTOLIC/END DIASTOLIC  RIGHT  ICA:  138/21cm/sec  CCA:                     546/2VO/JJK  SYSTOLIC ICA/CCA RATIO:  0.7  DIASTOLIC ICA/CCA RATIO: 2.5  ECA:                     255cm/sec  LEFT  ICA:                     255/18cm/sec  CCA:                     093/81WE/XHB  SYSTOLIC ICA/CCA RATIO:  7.16  DIASTOLIC ICA/CCA RATIO: .3  ECA:                     277cm/sec  FINDINGS: RIGHT CAROTID ARTERY: Circumferential partially calcified plaque in the mid and distal common carotid carotid artery and bulb without high-grade stenosis. Plaque involves the carotid bifurcation  and proximal internal and external carotid arteries. No high-grade stenosis. Elevated peak systolic velocities in the proximal ECA. No focal aliasing on color Doppler interrogation.  RIGHT VERTEBRAL ARTERY:  Normal flow direction and waveform.  LEFT CAROTID ARTERY: Eccentric partially calcified plaque in the distal common carotid artery and bulb. Plaque at the origin of the external carotid artery with elevated peak systolic velocities distally. Irregular partially calcified plaque in the proximal ICA with elevated peak systolic velocities and some focal aliasing on color Doppler interrogation. Waveforms in the distal IC unremarkable.  LEFT VERTEBRAL ARTERY: Normal flow direction and waveform.  IMPRESSION: 1. Bilateral carotid bifurcation and proximal ICA plaque, resulting in 50-69% diameter stenosis on the right, 70-99% diameter stenosis on the left. 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 Consider vascular surgical or neurointerventional radiology consultation.   Electronically Signed   By: Lucrezia Europe M.D.   On: 10/05/2014 10:09   Mr Lovenia Kim  10/05/2014   CLINICAL DATA:  Right hemiparesis of new onset. 24 hours duration. Mental status changes with confusion.  EXAM: MRI HEAD WITHOUT CONTRAST  MRA HEAD WITHOUT CONTRAST  TECHNIQUE: Multiplanar, multiecho pulse sequences of the brain and surrounding structures were obtained without intravenous contrast. Angiographic images of the head were obtained using MRA technique without contrast.  COMPARISON:  Head CT 10/04/2014  FINDINGS: MRI HEAD FINDINGS  The study suffers from motion degradation. Diffusion imaging shows acute infarction in the left frontal lobe and insular region. Underlying deep white matter also shows involvement. No other areas of acute infarction.  The brainstem and cerebellum are unremarkable. The cerebral hemispheres elsewhere show generalized atrophy with moderate chronic small-vessel ischemic changes throughout the white matter. No  evidence of mass lesion, hemorrhage, hydrocephalus or extra-axial collection. No inflammatory sinus disease.  MRA HEAD FINDINGS  Both internal carotid arteries are widely patent into the brain. The anterior and middle cerebral vessels are patent. Because of motion degradation, proximal anterior and middle cerebral vessels are not well evaluated for focal stenosis.  Both vertebral arteries are patent with the right being dominant. No basilar stenosis. Flow is present in both posterior cerebral arteries.  IMPRESSION: Background pattern of brain atrophy and chronic small vessel ischemic change.  Acute infarction in the left frontal lobe, insular region and subinsular white matter.  No major vessel occlusion. Motion degradation precludes evaluation for subtle stenotic disease. No proximal stenosis strongly suspected at this time.   Electronically Signed   By: Nelson Chimes M.D.   On: 10/05/2014 15:32  ASSESSMENT AND PLAN:   * Acute CVA -- patient has a prior history of stroke, he presented today with symptoms of right hemiparesis -Patient was recently taken off of antiplatelet medications due to GI bleed as well as significant bleeding from his fistula during dialysis sessions. -continue full strength aspirin  Per Neuro recommendation -MRI brain shows Acute infarction in the left frontal lobe, insular region and subinsular white matter. -Carotid doppler shows high grade stenosis on the left ICA. t underwnet right CEA in the past -CTA neck results noted - d/w dr schnier and he will decide after seeing pt.  * ESRD on hemodialysis - cont HD in house  * HTN (hypertension) - stable to mildly elevated at this time, continue home meds for this -allow permissive HTN  * Type 2 diabetes mellitus - sliding scale insulin with appropriate fingerstick glucose checks every 6 hours while nothing by mouth for now  * Anxiety - continue home meds  * GERD (gastroesophageal reflux disease) - home meds for  this  * HLD (hyperlipidemia) - home dose statin  *RLS (restless legs syndrome) - continue home meds  * Rotator cuff tear arthropathy of right shoulder - contributing to physical exam finding of right upper extremity weakness which is due to pain in his shoulder rather than true neurologic or muscular weakness. *PT recommends rehab. Pt reluctant but now agreeable  Case discussed with Care Management/Social Worker. Management plans discussed with the patient, family and they are in agreement.  CODE STATUS: FULL  DVT Prophylaxis: heparin  TOTAL TIME TAKING CARE OF THIS PATIENT: 35 minutes.  >50% time spent on counselling and coordination of care with pt and Dr schnier  POSSIBLE D/C IN 1-2 DAYS, DEPENDING ON CLINICAL CONDITION.   Jesua Tamblyn M.D on 10/06/2014 at 11:37 AM  Between 7am to 6pm - Pager - [phone]  After 6pm go to [email or web address] - password [email or web address] [facility]  Office  [phone]  CC: Primary care physician; Lelon Huh, MD

## 2014-10-06 NOTE — Progress Notes (Signed)
PRE HD   10/06/14 1315  Vital Signs  Temp 98.6 F (37 C)  Temp Source Oral  Pulse Rate 70  Pulse Rate Source Monitor  Resp (!) 21  BP (!) 157/74 mmHg  BP Location Right Arm  BP Method Automatic  Patient Position (if appropriate) Lying  Oxygen Therapy  SpO2 97 %  O2 Device Room Air  Pain Assessment  Pain Assessment No/denies pain  Pain Score 0  Dialysis Weight  Weight 108.2 kg (238 lb 8.6 oz)  Type of Weight Pre-Dialysis  Time-Out for Hemodialysis  What Procedure? HEMODIALYSIS  Pt Identifiers(min of two) First/Last Name;MRN/Account#  Correct Site? Yes  Correct Side? Yes  Correct Procedure? Yes  Consents Verified? Yes  Rad Studies Available? N/A  Safety Precautions Reviewed? Yes  Engineer, civil (consulting) Number [id number]  Station Number 4  UF/Alarm Test Passed  Conductivity: Meter 13.9  Conductivity: Machine  14.2  pH 7.4  Reverse Osmosis MAIN  Dialyzer Lot Number [id number]  Disposable Set Lot Number [id number]  Machine Temperature 98.6 F (37 C)  Musician and Audible Yes  Blood Lines Intact and Secured Yes  Pre Treatment Patient Checks  Vascular access used during treatment Fistula  Hepatitis B Surface Antigen Results Negative  Date Hepatitis B Surface Antigen Drawn 09/20/14  Hemodialysis Consent Verified Yes  Hemodialysis Standing Orders Initiated Yes  ECG (Telemetry) Monitor On Yes  Prime Ordered Normal Saline  Length of  DialysisTreatment -hour(s) 3 Hour(s)  Dialysis Treatment Comments HD TEREATMENT INITITATED.  GOAL TO REMOVE 2L IN 3 HOURS.  PT ALERT AND VS STABLE.  15G NEEDLES INSERTERD WITHOUT DIFFICULTY.   Dialyzer Optiflux 180 NR  Dialysate 2.5 Ca (3K)  Dialysis Anticoagulant None  Dialysate Flow Ordered 600  Blood Flow Rate Ordered 400 mL/min  Ultrafiltration Goal 2 Liters  Pre Treatment Labs Renal panel;CBC;Phosphorus  Dialysis Blood Pressure Support Ordered Normal Saline  Fistula / Graft Left Upper arm Arteriovenous vein graft  No  Placement Date or Time found.   Placed prior to admission: Yes  Orientation: Left  Access Location: Upper arm  Access Type: Arteriovenous vein graft  Site Condition No complications  Fistula / Graft Assessment Present;Thrill;Bruit  Status Accessed  Needle Size 15  Drainage Description None

## 2014-10-06 NOTE — Progress Notes (Addendum)
Per MD patient is agreeable to SNF and will be ready for D/C tomorrow. Clinical Education officer, museum (CSW) attempted to meet with patient however he was leaving the floor for dialysis. CSW contacted patient's daughter Helene Kelp. Helene Kelp is agreeable to SNF search. CSW presented bed offers to daughter. She chose [facility]. CSW made [facility] admissions coordinator at [facility] aware of accepted bed offer. Magda Paganini from [facility] came to visit patient today. Per Magda Paganini DSS is following patient. CSW will continue to follow and assist as needed.   Blima Rich, [place] [phone]

## 2014-10-07 LAB — GLUCOSE, CAPILLARY
GLUCOSE-CAPILLARY: 84 mg/dL (ref 65–99)
Glucose-Capillary: 80 mg/dL (ref 65–99)
Glucose-Capillary: 150 mg/dL — ABNORMAL HIGH (ref 65–99)
Glucose-Capillary: 85 mg/dL (ref 65–99)

## 2014-10-07 MED ORDER — POLYVINYL ALCOHOL 1.4 % OP SOLN: 1.0000 [drp] | OPHTHALMIC | Status: AC | PRN

## 2014-10-07 MED ORDER — ASPIRIN 325 MG PO TABS: 325.0000 mg | ORAL_TABLET | Freq: Every day | ORAL | Status: DC

## 2014-10-07 NOTE — Clinical Social Work Placement (Signed)
   CLINICAL SOCIAL WORK PLACEMENT  NOTE  Date:  10/07/2014  Patient Details  Name: Troy Gallagher MRN: [id number] Date of Birth: 07/06/1945  Clinical Social Work is seeking post-discharge placement for this patient at the [place] level of care (*CSW will initial, date and re-position this form in  chart as items are completed):  Yes   Patient/family provided with [facility] Work Department's list of facilities offering this level of care within the geographic area requested by the patient (or if unable, by the patient's family).  Yes   Patient/family informed of their freedom to choose among providers that offer the needed level of care, that participate in Medicare, Medicaid or managed care program needed by the patient, have an available bed and are willing to accept the patient.  Yes   Patient/family informed of [facility]'s ownership interest in [facility] and [facility], as well as of the fact that they are under no obligation to receive care at these facilities.  PASRR submitted to EDS on       PASRR number received on       Existing PASRR number confirmed on 10/06/14     FL2 transmitted to all facilities in geographic area requested by pt/family on 10/06/14     FL2 transmitted to all facilities within larger geographic area on       Patient informed that his/her managed care company has contracts with or will negotiate with certain facilities, including the following:        Yes   Patient/family informed of bed offers received.  Patient chooses bed at  [facility] )     Physician recommends and patient chooses bed at      Patient to be transferred to  [facility] ) on 10/07/14.  Patient to be transferred to facility by  [facility] )     Patient family notified on 10/07/14 of transfer.  Name of family member notified:   (Daughter's Amy and Helene Kelp are aware of D/C today. )     PHYSICIAN       Additional  Comment:    _______________________________________________ Loralyn Freshwater, LCSW 10/07/2014, 10:34 AM

## 2014-10-07 NOTE — Care Management (Addendum)
Discharge to [facility] today per Dr. Posey Pronto. Shelbie Ammons RN MSN Care Management [phone]

## 2014-10-07 NOTE — Clinical Social Work Note (Signed)
Clinical Social Work Assessment  Patient Details  Name: Troy Gallagher MRN: [id number] Date of Birth: 07-29-1945  Date of referral:  10/07/14               Reason for consult:  Facility Placement                Permission sought to share information with:  Chartered certified accountant granted to share information::  Yes, Verbal Permission Granted  Name::      Fish farm manager::   [place]   Relationship::     Contact Information:     Housing/Transportation Living arrangements for the past 2 months:  [place] of Information:  Patient, Adult Children Patient Interpreter Needed:  None Criminal Activity/Legal Involvement Pertinent to Current Situation/Hospitalization:  No - Comment as needed Significant Relationships:  Adult Children Lives with:  Adult Children Do you feel safe going back to the place where you live?  Yes Need for family participation in patient care:  Yes (Comment)  Care giving concerns: Patient lives with his daughter Troy Gallagher in [place].    Social Worker assessment / plan: Patient is familiar to [place] representative (CSW). Patient is a readmission. Patient refused SNF and D/C'ed home and then returned to the hospital for CVA. Patient goes to Dialysis treatments M,W,F at [facility] in [place]. Patient lives with his daughter Troy Gallagher. Troy Gallagher from [place] came to visit patient and is involved. Per Troy Gallagher patient's daughter Troy Gallagher is capable of taking care of patient however they argue sometimes. Patient is agreeable to SNF search and chose a bed at [facility].    Employment status:  Retired Forensic scientist:  [employer] PT Recommendations:  [place] / Referral to community resources:  [place]  Patient/Family's Response to care: Patient and daughter's are agreeable to SNF at [facility] for rehab.   Patient/Family's Understanding of and Emotional Response to Diagnosis, Current  Treatment, and Prognosis: Patient was pleasant and thanked CSW for visit.   Emotional Assessment Appearance:  Appears stated age Attitude/Demeanor/Rapport:    Affect (typically observed):  Accepting, Adaptable, Pleasant Orientation:  Oriented to Self, Oriented to Place, Oriented to  Time, Oriented to Situation Alcohol / Substance use:  Not Applicable Psych involvement (Current and /or in the community):  No (Comment)  Discharge Needs  Concerns to be addressed:  Discharge Planning Concerns Readmission within the last 30 days:  Yes Current discharge risk:  None Barriers to Discharge:  No Barriers Identified   Loralyn Freshwater, LCSW 10/07/2014, 10:35 AM

## 2014-10-07 NOTE — Plan of Care (Signed)
Problem: Discharge/Transitional Outcomes Goal: Other Discharge Outcomes/Goals Outcome: Progressing Plan of care progress to goals: 1. C/o headache relieved by PRN Tylenol. No other discomfort noted.  2. Hemodynamically:             -VSS, afebrile              -NIHSS 6: R-sided weakness/drift in right upper & lower extremity. Unable to state current month.              -Neuro/VS Q4H             -Dialysis M/W/F, 2L removed yesterday              -CBG Q6H  3. Tolerating heart healthy diet well.  4. +2 total assist bed to chair transfer. Refuses Q2h turning, some mobility in bed. Bed alarm on, hourly rounding. Understands how to use call system for assistance.

## 2014-10-07 NOTE — Progress Notes (Signed)
Patient is medically stable for D/C to [facility] today. Per [facility] admissions coordinator at [facility] patient is going to room 510. RN will call report to 500 hall and arrange EMS for transport. Clinical Education officer, museum (CSW) prepared D/C packet and sent D/C Summary to [employer] via [facility]. CSW left voicemail with Magda Paganini from [place] making her aware of above. CSW contacted [facility] Dialysis coordinator making her aware of above. CSW also contacted patient's daughters Amy and Helene Kelp and made them aware of above. Please reconsult if future social work needs arise. CSW signing off.   Blima Rich, [place] [phone]

## 2014-10-07 NOTE — Discharge Instructions (Signed)
PT at rehab °

## 2014-10-07 NOTE — Discharge Summary (Addendum)
[facility] [facility] [place] NAME: Troy Gallagher    MR#:  [id number]  DATE OF BIRTH:  [birth year]  DATE OF ADMISSION:  10/04/2014 ADMITTING PHYSICIAN: Lance Coon, MD  DATE OF DISCHARGE: 10/07/2014  PRIMARY CARE PHYSICIAN: Lelon Huh, MD    ADMISSION DIAGNOSIS:  Transient cerebral ischemia, unspecified transient cerebral ischemia type [G45.9]  DISCHARGE DIAGNOSIS:  Acute LEft Frontal CVA (new) Bilateral CA stenosis (left 70%) -out pt w/u with Vascular ESRD HTN Dm-2 not on meds   SECONDARY DIAGNOSIS:   Past Medical History  Diagnosis Date  . Complication of anesthesia   . Hypertension   . Stroke   . GERD (gastroesophageal reflux disease)   . Headache   . Kidney dialysis status [id number]  . HLD (hyperlipidemia)   . Diabetes mellitus, type II   . Seizure   . BPH (benign prostatic hyperplasia)   . COPD (chronic obstructive pulmonary disease)   . PVD (peripheral vascular disease)   . Carotid stenosis   . Adenoma of large intestine   . ESRD on hemodialysis     MWF  . Anxiety   . RLS (restless legs syndrome)     HOSPITAL COURSE:   * Acute CVA - patient has a prior history of stroke, he presented today with symptoms of right hemiparesis -Patient was recently taken off of antiplatelet medications due to GI bleed as well as significant bleeding from his fistula during dialysis sessions. -continue full strength aspirin Per Neuro recommendation -MRI brain shows Acute infarction in the left frontal lobe, insular region and subinsular white matter. -Carotid doppler shows high grade stenosis on the left ICA. pt underwnet right CEA in the past -CTA neck results noted - d/w dr schnier and he will follow up pt in office and discuss options at later date. For now cont ASA  * ESRD on hemodialysis - cont HD in house  * HTN (hypertension) - stable to mildly elevated at this time, continue home meds for this -allow permissive HTN  *  Type 2 diabetes mellitus - sliding scale insulin with appropriate fingerstick glucose checks every 6 hours -not on nay po meds. Sugars very well controlled  * Anxiety - continue home meds  *GERD (gastroesophageal reflux disease) - home meds for this  * HLD (hyperlipidemia) - home dose statin  *RLS (restless legs syndrome) - continue home meds  Spoke with dter Helene Kelp for plans to d/c to [facility] today  DISCHARGE CONDITIONS:   fair  CONSULTS OBTAINED:  Treatment Team:  Leotis Pain, MD Murlean Iba, MD  DRUG ALLERGIES:   Allergies  Allergen Reactions  . Sulfur Hives    DISCHARGE MEDICATIONS:   Current Discharge Medication List    START taking these medications   Details  aspirin 325 MG tablet Take 1 tablet (325 mg total) by mouth daily. Qty: 30 tablet, Refills: 0    polyvinyl alcohol (LIQUIFILM TEARS) 1.4 % ophthalmic solution Place 1 drop into both eyes as needed for dry eyes. Qty: 15 mL, Refills: 0      CONTINUE these medications which have NOT CHANGED   Details  acetaminophen (TYLENOL) 325 MG tablet Take 650 mg by mouth every 6 (six) hours as needed for mild pain or headache.    albuterol-ipratropium (COMBIVENT) 18-103 MCG/ACT inhaler Inhale 2 puffs into the lungs every 4 (four) hours as needed for wheezing or shortness of breath.    allopurinol (ZYLOPRIM) 100 MG tablet Take 100 mg by mouth every  evening.     ALPRAZolam (XANAX) 0.5 MG tablet Take 1 tablet (0.5 mg total) by mouth 2 (two) times daily. Qty: 180 tablet, Refills: 1    amLODipine (NORVASC) 10 MG tablet Take 10 mg by mouth daily. Pt does not take on dialysis days.    atorvastatin (LIPITOR) 40 MG tablet Take 40 mg by mouth every evening.     azelastine (ASTELIN) 0.1 % nasal spray Place 2 sprays into both nostrils daily as needed for rhinitis.     cinacalcet (SENSIPAR) 30 MG tablet Take 30 mg by mouth every evening.    cycloSPORINE (RESTASIS) 0.05 % ophthalmic emulsion Place 1 drop  into both eyes 2 (two) times daily. Qty: 0.4 mL, Refills: 3    diphenhydrAMINE (BENADRYL) 25 MG tablet Take 25 mg by mouth at bedtime as needed for itching or sleep.    esomeprazole (NEXIUM) 40 MG capsule Take 1 capsule (40 mg total) by mouth 2 (two) times daily. Qty: 60 capsule, Refills: 6    furosemide (LASIX) 80 MG tablet Take 80 mg by mouth daily. Pt does not take on dialysis days.    indomethacin (INDOCIN) 50 MG capsule Take 50 mg by mouth 2 (two) times daily as needed for mild pain.     lidocaine-prilocaine (EMLA) cream Apply 1 application topically as needed (before port access).     loratadine (CLARITIN) 10 MG tablet Take 10 mg by mouth daily as needed for allergies.     losartan (COZAAR) 25 MG tablet Take 25 mg by mouth every evening.    meclizine (ANTIVERT) 25 MG tablet Take 25 mg by mouth 2 (two) times daily as needed for dizziness.    potassium chloride SA (K-DUR,KLOR-CON) 20 MEQ tablet Take 20 mEq by mouth daily.    rOPINIRole (REQUIP) 2 MG tablet Take 2 mg by mouth at bedtime.    labetalol (NORMODYNE) 100 MG tablet Take 1 tablet (100 mg total) by mouth 2 (two) times daily. Qty: 60 tablet, Refills: 6    sevelamer carbonate (RENVELA) 800 MG tablet Take 2 tablets (1,600 mg total) by mouth 3 (three) times daily with meals. Qty: 90 tablet, Refills: 6    traMADol-acetaminophen (ULTRACET) 37.5-325 MG per tablet Take 1 tablet by mouth every 6 (six) hours as needed. Qty: 40 tablet, Refills: 0      STOP taking these medications     amoxicillin-clavulanate (AUGMENTIN) 875-125 MG per tablet         If you experience worsening of your admission symptoms, develop shortness of breath, life threatening emergency, suicidal or homicidal thoughts you must seek medical attention immediately by calling 911 or calling your MD immediately  if symptoms less severe.  You Must read complete instructions/literature along with all the possible adverse reactions/side effects for all the  Medicines you take and that have been prescribed to you. Take any new Medicines after you have completely understood and accept all the possible adverse reactions/side effects.   Please note  You were cared for by a hospitalist during your hospital stay. If you have any questions about your discharge medications or the care you received while you were in the hospital after you are discharged, you can call the unit and asked to speak with the hospitalist on call if the hospitalist that took care of you is not available. Once you are discharged, your primary care physician will handle any further medical issues. Please note that NO REFILLS for any discharge medications will be authorized once you are discharged,  as it is imperative that you return to your primary care physician (or establish a relationship with a primary care physician if you do not have one) for your aftercare needs so that they can reassess your need for medications and monitor your lab values. Today   SUBJECTIVE   Doing well  VITAL SIGNS:  Blood pressure 174/57, pulse 60, temperature 97.7 F (36.5 C), temperature source Oral, resp. rate 17, height 5\' 9"  (1.753 m), weight 102.1 kg (225 lb 1.4 oz), SpO2 99 %.  I/O:   Intake/Output Summary (Last 24 hours) at 10/07/14 0919 Last data filed at 10/06/14 2100  Gross per 24 hour  Intake      0 ml  Output   2050 ml  Net  -2050 ml    PHYSICAL EXAMINATION:  GENERAL:  69 y.o.-year-old patient lying in the bed with no acute distress.  EYES: Pupils equal, round, reactive to light and accommodation. No scleral icterus. Extraocular muscles intact.  HEENT: Head atraumatic, normocephalic. Oropharynx and nasopharynx clear.  NECK:  Supple, no jugular venous distention. No thyroid enlargement, no tenderness.  LUNGS: Normal breath sounds bilaterally, no wheezing, rales,rhonchi or crepitation. No use of accessory muscles of respiration.  CARDIOVASCULAR: S1, S2 normal. No murmurs, rubs, or  gallops.  ABDOMEN: Soft, non-tender, non-distended. Bowel sounds present. No organomegaly or mass.  EXTREMITIES: No pedal edema, cyanosis, or clubbing.  NEUROLOGIC: Cranial nerves II through XII are intact. Muscle strength 4/5 in all extremities. Sensation intact. Gait not checked.  PSYCHIATRIC: The patient is alert and oriented x 3.  SKIN: No obvious rash, lesion, or ulcer.   DATA REVIEW:   CBC   Recent Labs Lab 10/06/14 1430  WBC 7.0  HGB 9.6*  HCT 28.9*  PLT 349    Chemistries   Recent Labs Lab 10/04/14 1924  10/06/14 1430  NA 133*  < > 138  K 4.3  < > 4.6  CL 97*  < > 105  CO2 23  < > 19*  GLUCOSE 112*  < > 108*  BUN 69*  < > 80*  CREATININE 7.89*  < > 8.30*  CALCIUM 9.7  < > 9.6  AST 20  --   --   ALT 18  --   --   ALKPHOS 65  --   --   BILITOT 0.4  --   --   < > = values in this interval not displayed.  Microbiology Results   No results found for this or any previous visit (from the past 240 hour(s)).  RADIOLOGY:  Ct Angio Neck W/cm &/or Wo/cm  10/05/2014   ADDENDUM REPORT: 10/05/2014 16:43  ADDENDUM: Study discussed by telephone with Dr. Fritzi Mandes on 10/05/2014 at 1637 hours.   Electronically Signed   By: Genevie Ann M.D.   On: 10/05/2014 16:43   10/05/2014   CLINICAL DATA:  69 year old male with 24 hours of acute right hemiparesis. Anterior left frontal lobe infarct. Initial encounter.  EXAM: CT ANGIOGRAPHY NECK  TECHNIQUE: Multidetector CT imaging of the neck was performed using the standard protocol during bolus administration of intravenous contrast. Multiplanar CT image reconstructions and MIPs were obtained to evaluate the vascular anatomy. Carotid stenosis measurements (when applicable) are obtained utilizing NASCET criteria, using the distal internal carotid diameter as the denominator.  CONTRAST:  23mL OMNIPAQUE IOHEXOL 350 MG/ML SOLN  COMPARISON:  Brain MRI and intracranial MRA 1500 hours today. CT cervical spine 09/30/2014.  FINDINGS: Motion artifact.  Superimposed cervical ACDF hardware streak artifact.  Aortic arch: Advanced calcified atherosclerosis of the aortic arch. Three vessel arch configuration.  Right carotid system: High-grade stenosis suspected at the right CCA origin related to mostly calcified plaque. See series 8, image 84 and series 4, image 172. Intermittent calcified plaque distal to the origin. At the right carotid bifurcation there is circumferential soft plaque no hemodynamically significant stenosis at the right ICA origin. Tortuous mid cervical right ICA with calcified plaque resulting in up to 50 % stenosis with respect to the distal vessel. Negative distal cervical right ICA. Partially visible right ICA siphon with calcified plaque.  Left carotid system: High-grade stenosis suspected at the left CCA origin related to calcified plaque (series 4, image 200). Intermittent calcified plaque without additional left CCA stenosis. At the left carotid bifurcation there is bulky calcified plaque involving the left ICA origin and bulb. Stenosis up to 50 % with respect to the distal vessel occurs. Distal to the bulb the cervical left ICA is tortuous but otherwise negative to the skullbase. Partially visible left ICA siphon calcified plaque.  Vertebral arteries:  Calcified plaque in the proximal right subclavian artery without hemodynamically significant stenosis. Calcified plaque at the right vertebral artery origin with moderate to severe stenosis (series 8, image 111). Intermittent right V2 segment calcified plaque. Calcified plaque in the distal right vertebral artery with up to 50 percent stenosis. Right PICA and vertebrobasilar junction are patent.  Confluent calcified plaque in the proximal left subclavian artery with up to 50% stenosis. Calcified plaque at the left vertebral artery origin with mild stenosis (series 8, image 118). Dense calcified plaque in the left V1 segment appears to occlude the left vertebral artery (series 4, image 159). No  definite left vertebral artery enhancement distally in the neck. There does appear to be reconstituted enhancement Intracranially from the right side, an the left PICA origin remains patent.  Skeleton: The cervical spine appears stable with postoperative changes from the C3 to the C6 level and severe disc space loss and/or interbody ankylosis at C6-C7. Degenerative changes about the odontoid with ligamentous hypertrophy and sub chondroid cysts again noted. Absent dentition. No acute osseous abnormality identified. Visualized paranasal sinuses and mastoids are clear.  Other neck: No superior mediastinal lymphadenopathy. Mild respiratory motion artifact in the lung apices. Thyroid, larynx, pharynx, parapharyngeal spaces, retropharyngeal space, sublingual space, submandibular glands, and parotid glands are within normal limits. There is hyperdense material in the right gingivobuccal sulcus, may be chewing gum or similar artifact. No cervical lymphadenopathy.  IMPRESSION: 1. Intermittent motion artifact, and streak artifact from cervical ACDF. 2. 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 It appears to be reconstituted Intracranially from the right side such the the left PICA remains patent. 3. Moderate to severe right vertebral artery origin stenosis. The right vertebral artery remains patent. 4. High-grade stenosis suspected at both common carotid artery origins off the arch. Advanced arch calcified atherosclerosis. 5. Bilateral carotid bifurcation atherosclerosis, but no hemodynamically significant cervical ICA stenosis.  Electronically Signed: By: Genevie Ann M.D. On: 10/05/2014 16:29   Mr Brain Wo Contrast  10/05/2014   CLINICAL DATA:  Right hemiparesis of new onset. 24 hours duration. Mental status changes with confusion.  EXAM: MRI HEAD WITHOUT CONTRAST  MRA HEAD WITHOUT CONTRAST  TECHNIQUE: Multiplanar, multiecho pulse sequences of the brain and surrounding structures were obtained without intravenous  contrast. Angiographic images of the head were obtained using MRA technique without contrast.  COMPARISON:  Head CT 10/04/2014  FINDINGS: MRI HEAD FINDINGS  The  study suffers from motion degradation. Diffusion imaging shows acute infarction in the left frontal lobe and insular region. Underlying deep white matter also shows involvement. No other areas of acute infarction.  The brainstem and cerebellum are unremarkable. The cerebral hemispheres elsewhere show generalized atrophy with moderate chronic small-vessel ischemic changes throughout the white matter. No evidence of mass lesion, hemorrhage, hydrocephalus or extra-axial collection. No inflammatory sinus disease.  MRA HEAD FINDINGS  Both internal carotid arteries are widely patent into the brain. The anterior and middle cerebral vessels are patent. Because of motion degradation, proximal anterior and middle cerebral vessels are not well evaluated for focal stenosis.  Both vertebral arteries are patent with the right being dominant. No basilar stenosis. Flow is present in both posterior cerebral arteries.  IMPRESSION: Background pattern of brain atrophy and chronic small vessel ischemic change.  Acute infarction in the left frontal lobe, insular region and subinsular white matter.  No major vessel occlusion. Motion degradation precludes evaluation for subtle stenotic disease. No proximal stenosis strongly suspected at this time.   Electronically Signed   By: Nelson Chimes M.D.   On: 10/05/2014 15:32   Mr Lovenia Kim  10/05/2014   CLINICAL DATA:  Right hemiparesis of new onset. 24 hours duration. Mental status changes with confusion.  EXAM: MRI HEAD WITHOUT CONTRAST  MRA HEAD WITHOUT CONTRAST  TECHNIQUE: Multiplanar, multiecho pulse sequences of the brain and surrounding structures were obtained without intravenous contrast. Angiographic images of the head were obtained using MRA technique without contrast.  COMPARISON:  Head CT 10/04/2014  FINDINGS: MRI HEAD  FINDINGS  The study suffers from motion degradation. Diffusion imaging shows acute infarction in the left frontal lobe and insular region. Underlying deep white matter also shows involvement. No other areas of acute infarction.  The brainstem and cerebellum are unremarkable. The cerebral hemispheres elsewhere show generalized atrophy with moderate chronic small-vessel ischemic changes throughout the white matter. No evidence of mass lesion, hemorrhage, hydrocephalus or extra-axial collection. No inflammatory sinus disease.  MRA HEAD FINDINGS  Both internal carotid arteries are widely patent into the brain. The anterior and middle cerebral vessels are patent. Because of motion degradation, proximal anterior and middle cerebral vessels are not well evaluated for focal stenosis.  Both vertebral arteries are patent with the right being dominant. No basilar stenosis. Flow is present in both posterior cerebral arteries.  IMPRESSION: Background pattern of brain atrophy and chronic small vessel ischemic change.  Acute infarction in the left frontal lobe, insular region and subinsular white matter.  No major vessel occlusion. Motion degradation precludes evaluation for subtle stenotic disease. No proximal stenosis strongly suspected at this time.   Electronically Signed   By: Nelson Chimes M.D.   On: 10/05/2014 15:32     Management plans discussed with the patient, family and they are in agreement.  CODE STATUS:     Code Status Orders        Start     Ordered   10/04/14 2344  Full code   Continuous     10/04/14 2343      TOTAL TIME TAKING CARE OF THIS PATIENT: 40 minutes.    Breshae Belcher M.D on 10/07/2014 at 9:19 AM  Between 7am to 6pm - Pager - [phone] After 6pm go to [email or web address] - password [email or web address] [facility]  Office  [phone]  CC: Primary care physician; Lelon Huh, MD

## 2014-10-07 NOTE — Plan of Care (Signed)
Plan of Care Progress to Goal:  Pt being d/ced to [facility].  Called report to [place].  He will be going to Rm 510.  Pt c/o of headache pain which resolved w/tylenol.  Scored 7 on NIHSS.  Pt put on 2L last night when he desatted - may have some sleep apnea.  IV removed by [facility] nursing student.  Pt will leave via EMS after lunch.

## 2014-10-07 NOTE — Progress Notes (Signed)
[facility]  ROUNDING NOTE   Subjective:   Hemodialysis yesterday. Tolerated treatment well. UF of  2 litres  Objective:  Vital signs in last 24 hours:  Temp:  [97.7 F (36.5 C)-98.6 F (37 C)] 97.7 F (36.5 C) (09/08 0755) Pulse Rate:  [60-77] 60 (09/08 0755) Resp:  [15-23] 17 (09/08 0755) BP: (139-174)/(57-82) 174/57 mmHg (09/08 0755) SpO2:  [96 %-100 %] 99 % (09/08 0755) Weight:  [102.1 kg (225 lb 1.4 oz)-108.2 kg (238 lb 8.6 oz)] 102.1 kg (225 lb 1.4 oz) (09/07 1630)  Weight change:  Filed Weights   10/04/14 2348 10/06/14 1315 10/06/14 1630  Weight: 107.82 kg (237 lb 11.2 oz) 108.2 kg (238 lb 8.6 oz) 102.1 kg (225 lb 1.4 oz)    Intake/Output: I/O last 3 completed shifts: In: -  Out: 2350 [Urine:350; Other:2000]   Intake/Output this shift:     Physical Exam: General: NAD, laying in bed  Head: Normocephalic, atraumatic. Moist oral mucosal membranes  Eyes: Anicteric, PERRL  Neck: Supple, trachea midline  Lungs:  Clear to auscultation normal effort  Heart: Regular rate and rhythm  Abdomen:  Soft, nontender, BS present  Extremities:  trace peripheral edema.  Neurologic: Nonfocal, moving all four extremities, right sided weakness  Skin: No lesions  Access: LUE AVF    Basic Metabolic Panel:  Recent Labs Lab 09/30/14 1633 10/04/14 1924 10/05/14 0542 10/06/14 1430  NA 138 133* 139 138  K 4.1 4.3 4.2 4.6  CL 101 97* 105 105  CO2 21* 23 23 19*  GLUCOSE 71 112* 93 108*  BUN 65* 69* 75* 80*  CREATININE 8.56* 7.89* 8.19* 8.30*  CALCIUM 9.6 9.7 9.2 9.6  PHOS 5.5*  --   --  5.7*    Liver Function Tests:  Recent Labs Lab 09/30/14 1633 10/04/14 1924 10/06/14 1430  AST  --  20  --   ALT  --  18  --   ALKPHOS  --  65  --   BILITOT  --  0.4  --   PROT  --  7.6  --   ALBUMIN 3.1* 3.3* 3.2*   No results for input(s): LIPASE, AMYLASE in the last 168 hours. No results for input(s): AMMONIA in the last 168 hours.  CBC:  Recent Labs Lab  09/30/14 1633 10/04/14 1924 10/05/14 0542 10/06/14 1430  WBC 8.8 9.4 6.5 7.0  NEUTROABS  --  7.9*  --   --   HGB 9.5* 9.9* 8.8* 9.6*  HCT 29.6* 30.3* 27.0* 28.9*  MCV 95.0 95.4 95.2 94.6  PLT 316 345 311 349    Cardiac Enzymes:  Recent Labs Lab 09/30/14 1110 10/04/14 1924  TROPONINI 0.03 0.03    BNP: Invalid input(s): POCBNP  CBG:  Recent Labs Lab 10/06/14 1106 10/06/14 1719 10/06/14 2135 10/07/14 0050 10/07/14 0737  GLUCAP 95 94 117* 84 80    Microbiology: Results for orders placed or performed during the hospital encounter of 10/24/09  Surgical pcr screen     Status: None   Collection Time: 10/19/09  2:01 PM  Result Value Ref Range Status   MRSA, PCR NEGATIVE NEGATIVE Final   Staphylococcus aureus  NEGATIVE Final    NEGATIVE        The Xpert SA Assay (FDA approved for NASAL specimens only), is one component of a comprehensive surveillance program.  It is not intended to diagnose infection nor to guide or monitor treatment.    Coagulation Studies:  Recent Labs  10/04/14 1924  LABPROT 14.0  INR 1.06    Urinalysis:  Recent Labs  10/04/14 1925  COLORURINE YELLOW*  LABSPEC 1.015  PHURINE 7.0  GLUCOSEU 50*  HGBUR NEGATIVE  BILIRUBINUR NEGATIVE  KETONESUR NEGATIVE  PROTEINUR >500*  NITRITE NEGATIVE  LEUKOCYTESUR NEGATIVE      Imaging: Ct Angio Neck W/cm &/or Wo/cm  10/05/2014   ADDENDUM REPORT: 10/05/2014 16:43  ADDENDUM: Study discussed by telephone with Dr. Fritzi Mandes on 10/05/2014 at 1637 hours.   Electronically Signed   By: Genevie Ann M.D.   On: 10/05/2014 16:43   10/05/2014   CLINICAL DATA:  69 year old male with 24 hours of acute right hemiparesis. Anterior left frontal lobe infarct. Initial encounter.  EXAM: CT ANGIOGRAPHY NECK  TECHNIQUE: Multidetector CT imaging of the neck was performed using the standard protocol during bolus administration of intravenous contrast. Multiplanar CT image reconstructions and MIPs were obtained to  evaluate the vascular anatomy. Carotid stenosis measurements (when applicable) are obtained utilizing NASCET criteria, using the distal internal carotid diameter as the denominator.  CONTRAST:  53mL OMNIPAQUE IOHEXOL 350 MG/ML SOLN  COMPARISON:  Brain MRI and intracranial MRA 1500 hours today. CT cervical spine 09/30/2014.  FINDINGS: Motion artifact. Superimposed cervical ACDF hardware streak artifact.  Aortic arch: Advanced calcified atherosclerosis of the aortic arch. Three vessel arch configuration.  Right carotid system: High-grade stenosis suspected at the right CCA origin related to mostly calcified plaque. See series 8, image 84 and series 4, image 172. Intermittent calcified plaque distal to the origin. At the right carotid bifurcation there is circumferential soft plaque no hemodynamically significant stenosis at the right ICA origin. Tortuous mid cervical right ICA with calcified plaque resulting in up to 50 % stenosis with respect to the distal vessel. Negative distal cervical right ICA. Partially visible right ICA siphon with calcified plaque.  Left carotid system: High-grade stenosis suspected at the left CCA origin related to calcified plaque (series 4, image 200). Intermittent calcified plaque without additional left CCA stenosis. At the left carotid bifurcation there is bulky calcified plaque involving the left ICA origin and bulb. Stenosis up to 50 % with respect to the distal vessel occurs. Distal to the bulb the cervical left ICA is tortuous but otherwise negative to the skullbase. Partially visible left ICA siphon calcified plaque.  Vertebral arteries:  Calcified plaque in the proximal right subclavian artery without hemodynamically significant stenosis. Calcified plaque at the right vertebral artery origin with moderate to severe stenosis (series 8, image 111). Intermittent right V2 segment calcified plaque. Calcified plaque in the distal right vertebral artery with up to 50 percent stenosis.  Right PICA and vertebrobasilar junction are patent.  Confluent calcified plaque in the proximal left subclavian artery with up to 50% stenosis. Calcified plaque at the left vertebral artery origin with mild stenosis (series 8, image 118). Dense calcified plaque in the left V1 segment appears to occlude the left vertebral artery (series 4, image 159). No definite left vertebral artery enhancement distally in the neck. There does appear to be reconstituted enhancement Intracranially from the right side, an the left PICA origin remains patent.  Skeleton: The cervical spine appears stable with postoperative changes from the C3 to the C6 level and severe disc space loss and/or interbody ankylosis at C6-C7. Degenerative changes about the odontoid with ligamentous hypertrophy and sub chondroid cysts again noted. Absent dentition. No acute osseous abnormality identified. Visualized paranasal sinuses and mastoids are clear.  Other neck: No superior mediastinal lymphadenopathy. Mild respiratory motion artifact in the  lung apices. Thyroid, larynx, pharynx, parapharyngeal spaces, retropharyngeal space, sublingual space, submandibular glands, and parotid glands are within normal limits. There is hyperdense material in the right gingivobuccal sulcus, may be chewing gum or similar artifact. No cervical lymphadenopathy.  IMPRESSION: 1. Intermittent motion artifact, and streak artifact from cervical ACDF. 2. 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 It appears to be reconstituted Intracranially from the right side such the the left PICA remains patent. 3. Moderate to severe right vertebral artery origin stenosis. The right vertebral artery remains patent. 4. High-grade stenosis suspected at both common carotid artery origins off the arch. Advanced arch calcified atherosclerosis. 5. Bilateral carotid bifurcation atherosclerosis, but no hemodynamically significant cervical ICA stenosis.  Electronically Signed: By: Genevie Ann  M.D. On: 10/05/2014 16:29   Mr Brain Wo Contrast  10/05/2014   CLINICAL DATA:  Right hemiparesis of new onset. 24 hours duration. Mental status changes with confusion.  EXAM: MRI HEAD WITHOUT CONTRAST  MRA HEAD WITHOUT CONTRAST  TECHNIQUE: Multiplanar, multiecho pulse sequences of the brain and surrounding structures were obtained without intravenous contrast. Angiographic images of the head were obtained using MRA technique without contrast.  COMPARISON:  Head CT 10/04/2014  FINDINGS: MRI HEAD FINDINGS  The study suffers from motion degradation. Diffusion imaging shows acute infarction in the left frontal lobe and insular region. Underlying deep white matter also shows involvement. No other areas of acute infarction.  The brainstem and cerebellum are unremarkable. The cerebral hemispheres elsewhere show generalized atrophy with moderate chronic small-vessel ischemic changes throughout the white matter. No evidence of mass lesion, hemorrhage, hydrocephalus or extra-axial collection. No inflammatory sinus disease.  MRA HEAD FINDINGS  Both internal carotid arteries are widely patent into the brain. The anterior and middle cerebral vessels are patent. Because of motion degradation, proximal anterior and middle cerebral vessels are not well evaluated for focal stenosis.  Both vertebral arteries are patent with the right being dominant. No basilar stenosis. Flow is present in both posterior cerebral arteries.  IMPRESSION: Background pattern of brain atrophy and chronic small vessel ischemic change.  Acute infarction in the left frontal lobe, insular region and subinsular white matter.  No major vessel occlusion. Motion degradation precludes evaluation for subtle stenotic disease. No proximal stenosis strongly suspected at this time.   Electronically Signed   By: Nelson Chimes M.D.   On: 10/05/2014 15:32   Mr Lovenia Kim  10/05/2014   CLINICAL DATA:  Right hemiparesis of new onset. 24 hours duration. Mental status  changes with confusion.  EXAM: MRI HEAD WITHOUT CONTRAST  MRA HEAD WITHOUT CONTRAST  TECHNIQUE: Multiplanar, multiecho pulse sequences of the brain and surrounding structures were obtained without intravenous contrast. Angiographic images of the head were obtained using MRA technique without contrast.  COMPARISON:  Head CT 10/04/2014  FINDINGS: MRI HEAD FINDINGS  The study suffers from motion degradation. Diffusion imaging shows acute infarction in the left frontal lobe and insular region. Underlying deep white matter also shows involvement. No other areas of acute infarction.  The brainstem and cerebellum are unremarkable. The cerebral hemispheres elsewhere show generalized atrophy with moderate chronic small-vessel ischemic changes throughout the white matter. No evidence of mass lesion, hemorrhage, hydrocephalus or extra-axial collection. No inflammatory sinus disease.  MRA HEAD FINDINGS  Both internal carotid arteries are widely patent into the brain. The anterior and middle cerebral vessels are patent. Because of motion degradation, proximal anterior and middle cerebral vessels are not well evaluated for focal stenosis.  Both vertebral arteries  are patent with the right being dominant. No basilar stenosis. Flow is present in both posterior cerebral arteries.  IMPRESSION: Background pattern of brain atrophy and chronic small vessel ischemic change.  Acute infarction in the left frontal lobe, insular region and subinsular white matter.  No major vessel occlusion. Motion degradation precludes evaluation for subtle stenotic disease. No proximal stenosis strongly suspected at this time.   Electronically Signed   By: Nelson Chimes M.D.   On: 10/05/2014 15:32     Medications:     . allopurinol  100 mg Oral QHS  . ALPRAZolam  0.5 mg Oral QHS  . aspirin  325 mg Oral Daily  . atorvastatin  40 mg Oral QPM  . cinacalcet  30 mg Oral QPM  . cycloSPORINE  1 drop Both Eyes BID  . furosemide  80 mg Oral Once per day  on Sun Tue Thu Sat  . heparin  5,000 Units Subcutaneous 3 times per day  . insulin aspart  0-9 Units Subcutaneous Q6H  . pantoprazole  40 mg Oral BID  . rOPINIRole  2 mg Oral QHS  . sevelamer carbonate  1,600 mg Oral TID WC   acetaminophen **OR** acetaminophen, ipratropium-albuterol, ondansetron **OR** ondansetron (ZOFRAN) IV, polyvinyl alcohol  Assessment/ Plan:  69 y.o. male  with end-stage renal disease on hemodialysis Monday, Wednesday, Friday, hypertension, COPD, GERD, gout, left upper extremity AV fistula, restless leg syndrome, hyperlipidemia who was admitted for generalized weakness.  1.  End-stage renal disease on hemodialysis Monday, Wednesday, Friday. Tolerated treatment yesterday. Next treatment for tomorrow.   2. Anemia chronic kidney disease.  Hgb 9.6, hold epo due to acute ischemic event.   3.  Secondary hyperparathyroidism. Phos 5.7. PTH as outpatient of 439.  - sevelamer with meals.  - Continue cinacalcet   4.  Hypertension:  Decent control.  - not on oral hypertensive agents currently.   Nixon, Lurena Nida 9/8/201610:47 AM

## 2014-10-12 ENCOUNTER — Ambulatory Visit: Payer: Self-pay | Admitting: Family Medicine

## 2014-10-20 ENCOUNTER — Inpatient Hospital Stay: Payer: Medicare Other | Admitting: Family Medicine

## 2014-11-09 ENCOUNTER — Inpatient Hospital Stay
Admission: EM | Admit: 2014-11-09 | Discharge: 2014-11-12 | DRG: 377 | Disposition: A | Discharge status: Skilled Nursing Facility | Payer: Medicare Other | Attending: Internal Medicine | Admitting: Internal Medicine

## 2014-11-09 ENCOUNTER — Emergency Department: Payer: Medicare Other

## 2014-11-09 ENCOUNTER — Encounter: Payer: Self-pay | Admitting: *Deleted

## 2014-11-09 DIAGNOSIS — N2581 Secondary hyperparathyroidism of renal origin: Secondary | ICD-10-CM | POA: Diagnosis present

## 2014-11-09 DIAGNOSIS — G2581 Restless legs syndrome: Secondary | ICD-10-CM | POA: Diagnosis present

## 2014-11-09 DIAGNOSIS — I6523 Occlusion and stenosis of bilateral carotid arteries: Secondary | ICD-10-CM | POA: Diagnosis present

## 2014-11-09 DIAGNOSIS — K2270 Barrett's esophagus without dysplasia: Secondary | ICD-10-CM | POA: Diagnosis present

## 2014-11-09 DIAGNOSIS — K2901 Acute gastritis with bleeding: Secondary | ICD-10-CM | POA: Diagnosis not present

## 2014-11-09 DIAGNOSIS — Z992 Dependence on renal dialysis: Secondary | ICD-10-CM

## 2014-11-09 DIAGNOSIS — R195 Other fecal abnormalities: Secondary | ICD-10-CM | POA: Diagnosis present

## 2014-11-09 DIAGNOSIS — Z87891 Personal history of nicotine dependence: Secondary | ICD-10-CM

## 2014-11-09 DIAGNOSIS — E1122 Type 2 diabetes mellitus with diabetic chronic kidney disease: Secondary | ICD-10-CM | POA: Diagnosis present

## 2014-11-09 DIAGNOSIS — D62 Acute posthemorrhagic anemia: Secondary | ICD-10-CM | POA: Diagnosis present

## 2014-11-09 DIAGNOSIS — I120 Hypertensive chronic kidney disease with stage 5 chronic kidney disease or end stage renal disease: Secondary | ICD-10-CM | POA: Diagnosis present

## 2014-11-09 DIAGNOSIS — Z86010 Personal history of colonic polyps: Secondary | ICD-10-CM

## 2014-11-09 DIAGNOSIS — F419 Anxiety disorder, unspecified: Secondary | ICD-10-CM | POA: Diagnosis present

## 2014-11-09 DIAGNOSIS — D122 Benign neoplasm of ascending colon: Secondary | ICD-10-CM | POA: Diagnosis present

## 2014-11-09 DIAGNOSIS — E785 Hyperlipidemia, unspecified: Secondary | ICD-10-CM | POA: Diagnosis present

## 2014-11-09 DIAGNOSIS — K921 Melena: Secondary | ICD-10-CM | POA: Diagnosis present

## 2014-11-09 DIAGNOSIS — J449 Chronic obstructive pulmonary disease, unspecified: Secondary | ICD-10-CM | POA: Diagnosis present

## 2014-11-09 DIAGNOSIS — D124 Benign neoplasm of descending colon: Secondary | ICD-10-CM | POA: Diagnosis present

## 2014-11-09 DIAGNOSIS — N186 End stage renal disease: Secondary | ICD-10-CM | POA: Diagnosis present

## 2014-11-09 DIAGNOSIS — D631 Anemia in chronic kidney disease: Secondary | ICD-10-CM | POA: Diagnosis present

## 2014-11-09 DIAGNOSIS — Y929 Unspecified place or not applicable: Secondary | ICD-10-CM

## 2014-11-09 DIAGNOSIS — F329 Major depressive disorder, single episode, unspecified: Secondary | ICD-10-CM | POA: Diagnosis present

## 2014-11-09 DIAGNOSIS — K219 Gastro-esophageal reflux disease without esophagitis: Secondary | ICD-10-CM | POA: Diagnosis present

## 2014-11-09 DIAGNOSIS — I69351 Hemiplegia and hemiparesis following cerebral infarction affecting right dominant side: Secondary | ICD-10-CM

## 2014-11-09 DIAGNOSIS — N400 Enlarged prostate without lower urinary tract symptoms: Secondary | ICD-10-CM | POA: Diagnosis present

## 2014-11-09 DIAGNOSIS — K922 Gastrointestinal hemorrhage, unspecified: Secondary | ICD-10-CM | POA: Diagnosis not present

## 2014-11-09 DIAGNOSIS — J810 Acute pulmonary edema: Secondary | ICD-10-CM | POA: Diagnosis present

## 2014-11-09 DIAGNOSIS — Z79891 Long term (current) use of opiate analgesic: Secondary | ICD-10-CM

## 2014-11-09 DIAGNOSIS — T39015A Adverse effect of aspirin, initial encounter: Secondary | ICD-10-CM | POA: Diagnosis present

## 2014-11-09 DIAGNOSIS — R531 Weakness: Secondary | ICD-10-CM

## 2014-11-09 DIAGNOSIS — Z882 Allergy status to sulfonamides status: Secondary | ICD-10-CM

## 2014-11-09 DIAGNOSIS — R569 Unspecified convulsions: Secondary | ICD-10-CM | POA: Diagnosis present

## 2014-11-09 DIAGNOSIS — M109 Gout, unspecified: Secondary | ICD-10-CM | POA: Diagnosis present

## 2014-11-09 DIAGNOSIS — Z79899 Other long term (current) drug therapy: Secondary | ICD-10-CM

## 2014-11-09 DIAGNOSIS — D649 Anemia, unspecified: Secondary | ICD-10-CM

## 2014-11-09 DIAGNOSIS — D500 Iron deficiency anemia secondary to blood loss (chronic): Secondary | ICD-10-CM | POA: Diagnosis present

## 2014-11-09 DIAGNOSIS — I739 Peripheral vascular disease, unspecified: Secondary | ICD-10-CM | POA: Diagnosis present

## 2014-11-09 DIAGNOSIS — R32 Unspecified urinary incontinence: Secondary | ICD-10-CM | POA: Diagnosis present

## 2014-11-09 DIAGNOSIS — Z7982 Long term (current) use of aspirin: Secondary | ICD-10-CM

## 2014-11-09 DIAGNOSIS — R0602 Shortness of breath: Secondary | ICD-10-CM

## 2014-11-09 LAB — CBC WITH DIFFERENTIAL/PLATELET
BASOS PCT: 1 %
Basophils Absolute: 0.1 10*3/uL (ref 0–0.1)
Eosinophils Absolute: 0.1 10*3/uL (ref 0–0.7)
Eosinophils Relative: 1 %
HEMATOCRIT: 23.2 % — AB (ref 40.0–52.0)
HEMOGLOBIN: 7.7 g/dL — AB (ref 13.0–18.0)
LYMPHS ABS: 0.9 10*3/uL — AB (ref 1.0–3.6)
Lymphocytes Relative: 9 %
MCH: 32.1 pg (ref 26.0–34.0)
MCHC: 33.1 g/dL (ref 32.0–36.0)
MCV: 97 fL (ref 80.0–100.0)
MONO ABS: 1 10*3/uL (ref 0.2–1.0)
MONOS PCT: 10 %
NEUTROS ABS: 8 10*3/uL — AB (ref 1.4–6.5)
Neutrophils Relative %: 79 %
Platelets: 263 10*3/uL (ref 150–440)
RBC: 2.39 MIL/uL — ABNORMAL LOW (ref 4.40–5.90)
RDW: 15.3 % — AB (ref 11.5–14.5)
WBC: 10 10*3/uL (ref 3.8–10.6)

## 2014-11-09 LAB — COMPREHENSIVE METABOLIC PANEL
ALK PHOS: 72 U/L (ref 38–126)
ALT: 8 U/L — AB (ref 17–63)
ANION GAP: 11 (ref 5–15)
AST: 11 U/L — ABNORMAL LOW (ref 15–41)
Albumin: 3.5 g/dL (ref 3.5–5.0)
BILIRUBIN TOTAL: 1 mg/dL (ref 0.3–1.2)
BUN: 63 mg/dL — ABNORMAL HIGH (ref 6–20)
CO2: 19 mmol/L — ABNORMAL LOW (ref 22–32)
CREATININE: 8.19 mg/dL — AB (ref 0.61–1.24)
Calcium: 8.8 mg/dL — ABNORMAL LOW (ref 8.9–10.3)
Chloride: 110 mmol/L (ref 101–111)
GFR, EST AFRICAN AMERICAN: 7 mL/min — AB (ref >60)
GFR, EST NON AFRICAN AMERICAN: 6 mL/min — AB (ref >60)
Glucose, Bld: 94 mg/dL (ref 65–99)
Potassium: 5 mmol/L (ref 3.5–5.1)
Sodium: 140 mmol/L (ref 135–145)
TOTAL PROTEIN: 7.3 g/dL (ref 6.5–8.1)

## 2014-11-09 LAB — TROPONIN I: Troponin I: 0.05 ng/mL — ABNORMAL HIGH (ref <0.031)

## 2014-11-09 LAB — URINALYSIS COMPLETE WITH MICROSCOPIC (ARMC ONLY)
BILIRUBIN URINE: NEGATIVE
GLUCOSE, UA: 150 mg/dL — AB
Hgb urine dipstick: NEGATIVE
LEUKOCYTES UA: NEGATIVE
Nitrite: NEGATIVE
PH: 8 (ref 5.0–8.0)
Protein, ur: 100 mg/dL — AB
RBC / HPF: NONE SEEN RBC/hpf (ref 0–5)
Specific Gravity, Urine: 1.013 (ref 1.005–1.030)

## 2014-11-09 LAB — MAGNESIUM: Magnesium: 1.7 mg/dL (ref 1.7–2.4)

## 2014-11-09 NOTE — ED Provider Notes (Signed)
[facility] Emergency Department Provider Note  ____________________________________________  Time seen: Approximately 10:28 PM  I have reviewed the triage vital signs and the nursing notes.   HISTORY  Chief Complaint Weakness  The patient is a vague historian  HPI Troy Gallagher is a 69 y.o. male with a history of an acute stroke approximately one month ago after which the patient went to [facility] for rehabilitation but is now home.  He also has chronic kidney disease and is on hemodialysis on Monday, Wednesday, Friday.  He states he has had progressive generalized weakness for the last 2 days.  He was not able to go to dialysis 2 days ago due to the weakness.  He has also had several days of productive cough and urinary incontinence with reportedly very foul-smelling urine, and he has felt increasingly short of breath over the last 24 hours.  He has had subjective fevers.  He is alert and oriented but appears uncomfortable.  Overall he and his daughter state that his symptoms are severe.nothing makes them better nothing makes it worse   Past Medical History  Diagnosis Date  . Complication of anesthesia   . Hypertension   . Stroke ([place])   . GERD (gastroesophageal reflux disease)   . Headache   . Kidney dialysis status [id number]  . HLD (hyperlipidemia)   . Diabetes mellitus, type II ([place])   . Seizure ([place])   . BPH (benign prostatic hyperplasia)   . COPD (chronic obstructive pulmonary disease) ([place])   . PVD (peripheral vascular disease) ([place])   . Carotid stenosis   . Adenoma of large intestine   . ESRD on hemodialysis ([place])     MWF  . Anxiety   . RLS (restless legs syndrome)     Patient Active Problem List   Diagnosis Date Noted  . TIA (transient ischemic attack) 10/04/2014  . Rotator cuff tear arthropathy of right shoulder 10/03/2014  . Generalized weakness 10/01/2014  . Elevated troponin 09/30/2014  . Weakness generalized 09/30/2014  . ESRD  on hemodialysis ([place]) 09/30/2014  . GERD (gastroesophageal reflux disease) 09/30/2014  . HTN (hypertension) 09/30/2014  . HLD (hyperlipidemia) 09/30/2014  . Type 2 diabetes mellitus ([place]) 09/30/2014  . Anxiety 09/30/2014  . RLS (restless legs syndrome) 09/30/2014  . History of CVA (cerebrovascular accident) 09/28/2014  . Symptoms, musculoskeletal, limb NEC 09/28/2014  . Anemia 09/28/2014  . Hyperlipidemia, mixed 09/28/2014  . Bilateral carotid artery stenosis 12/04/2011  . Depressive disorder 01/29/1998    Past Surgical History  Procedure Laterality Date  . Shoulder arthroscopy w/ rotator cuff repair Left 1999  . Neck surgery N/A 10/24/2009    Cervical Diskectomy- C3-4, 4-5, 5-6 anterior disckectomy by Dr. Arnoldo Morale at [facility]  . Av fistula placement Left 2 years  . Colonoscopy N/A 06/14/2014    Procedure: COLONOSCOPY;  Surgeon: Hulen Luster, MD;  Location: [facility] ENDOSCOPY;  Service: Gastroenterology;  Laterality: N/A;  . Carotid endarterectomy Right 06/05/2013    Dr. Delana Meyer  . Tonsillectomy and adenoidectomy  1950  . 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 Repeat in 3 years  . Carotid doppler ultrasound  04/22/2013    85-90% stenosis of the RICA, 60-70% stenosis of left cmmon carotid and origin of left subclavian  . Doppler echocardiography  12/04/2011    Bilateral stenosis 80%  . Arch aortogram  04/07/2013    85-90% Stenosis origin of right ICA. 65-70% narrowing of  left CCA. 50-60% narrowing of left subclavian artery    Current Outpatient Rx  Name  Route  Sig  Dispense  Refill  . acetaminophen (TYLENOL) 325 MG tablet   Oral   Take 650 mg by mouth every 6 (six) hours as needed for mild pain or headache.         . albuterol-ipratropium (COMBIVENT) 18-103 MCG/ACT inhaler   Inhalation   Inhale 2 puffs into the lungs every 4 (four) hours as needed for wheezing or shortness of breath.         . allopurinol  (ZYLOPRIM) 100 MG tablet   Oral   Take 100 mg by mouth every evening.          Marland Kitchen ALPRAZolam (XANAX) 0.5 MG tablet   Oral   Take 1 tablet (0.5 mg total) by mouth 2 (two) times daily. Patient taking differently: Take 0.5 mg by mouth at bedtime.    180 tablet   1   . amLODipine (NORVASC) 10 MG tablet   Oral   Take 10 mg by mouth daily. Pt does not take on dialysis days.         Marland Kitchen aspirin 325 MG tablet   Oral   Take 1 tablet (325 mg total) by mouth daily.   30 tablet   0   . atorvastatin (LIPITOR) 40 MG tablet   Oral   Take 40 mg by mouth every evening.          Marland Kitchen azelastine (ASTELIN) 0.1 % nasal spray   Each Nare   Place 2 sprays into both nostrils daily as needed for rhinitis.          . cinacalcet (SENSIPAR) 30 MG tablet   Oral   Take 30 mg by mouth every evening.         . cycloSPORINE (RESTASIS) 0.05 % ophthalmic emulsion   Both Eyes   Place 1 drop into both eyes 2 (two) times daily.   0.4 mL   3   . diphenhydrAMINE (BENADRYL) 25 MG tablet   Oral   Take 25 mg by mouth at bedtime as needed for itching or sleep.         Marland Kitchen esomeprazole (NEXIUM) 40 MG capsule   Oral   Take 1 capsule (40 mg total) by mouth 2 (two) times daily.   60 capsule   6   . furosemide (LASIX) 80 MG tablet   Oral   Take 80 mg by mouth daily. Pt does not take on dialysis days.         . indomethacin (INDOCIN) 50 MG capsule   Oral   Take 50 mg by mouth 2 (two) times daily as needed for mild pain.          Marland Kitchen labetalol (NORMODYNE) 100 MG tablet   Oral   Take 1 tablet (100 mg total) by mouth 2 (two) times daily.   60 tablet   6   . lidocaine-prilocaine (EMLA) cream   Topical   Apply 1 application topically as needed (before port access).          Marland Kitchen loratadine (CLARITIN) 10 MG tablet   Oral   Take 10 mg by mouth daily as needed for allergies.          Marland Kitchen losartan (COZAAR) 25 MG tablet   Oral   Take 25 mg by mouth every evening.         . meclizine (ANTIVERT)  25 MG tablet  Oral   Take 25 mg by mouth 2 (two) times daily as needed for dizziness.         . polyvinyl alcohol (LIQUIFILM TEARS) 1.4 % ophthalmic solution   Both Eyes   Place 1 drop into both eyes as needed for dry eyes.   15 mL   0   . potassium chloride SA (K-DUR,KLOR-CON) 20 MEQ tablet   Oral   Take 20 mEq by mouth daily.         Marland Kitchen rOPINIRole (REQUIP) 2 MG tablet   Oral   Take 2 mg by mouth at bedtime.         . sevelamer carbonate (RENVELA) 800 MG tablet   Oral   Take 2 tablets (1,600 mg total) by mouth 3 (three) times daily with meals.   90 tablet   6   . traMADol-acetaminophen (ULTRACET) 37.5-325 MG per tablet   Oral   Take 1 tablet by mouth every 6 (six) hours as needed. Patient taking differently: Take 1 tablet by mouth every 6 (six) hours as needed for moderate pain.    40 tablet   0     Allergies Sulfur  Family History  Problem Relation Age of Onset  . Stroke Mother   . Prostate cancer Father   . Heart disease Sister     Social History Social History  Substance Use Topics  . Smoking status: Former Smoker -- 2.00 packs/day for 30 years    Types: Cigarettes    Quit date: 01/29/2002  . Smokeless tobacco: Current User  . Alcohol Use: No    Review of Systems Constitutional: subjective fever/chills Eyes: No visual changes. ENT: No sore throat. Cardiovascular: Denies chest pain. Respiratory: increasing shortness of breath over the last 24 hours with at least several days of productive cough Gastrointestinal: No abdominal pain.  No nausea, no vomiting.  No diarrhea.  No constipation. Genitourinary: no dysuria but occasional urinary incontinence with foul-smelling urine Musculoskeletal: Negative for back pain. Skin: Negative for rash. Neurological: Negative for headaches, focal weakness or numbness.  10-point ROS otherwise negative.  ____________________________________________   PHYSICAL EXAM:  VITAL SIGNS: ED Triage Vitals  Enc  Vitals Group     BP --      Pulse Rate 11/09/14 2154 78     Resp 11/09/14 2154 25     Temp 11/09/14 2154 99.7 F (37.6 C)     Temp Source 11/09/14 2154 Oral     SpO2 11/09/14 2154 98 %     Weight 11/09/14 2154 240 lb (108.863 kg)     Height 11/09/14 2154 5\' 9"  (1.753 m)     Head Cir --      Peak Flow --      Pain Score 11/09/14 2155 10     Pain Loc --      Pain Edu? --      Excl. in [place]? --     Constitutional: Alert and oriented but somewhat ill-appearing and in mild distress. Eyes: Conjunctivae are normal. PERRL. EOMI. Head: Atraumatic. Nose: No congestion/rhinnorhea. Mouth/Throat: Mucous membranes are moist.  Oropharynx non-erythematous. Neck: No stridor.   Cardiovascular: Normal rate, regular rhythm. Grossly normal heart sounds.  Good peripheral circulation. Respiratory: Normal respiratory effort.  No retractions. Lungs CTAB. Gastrointestinal: obese. Soft and nontender. No distention. No abdominal bruits. No CVA tenderness. Rectal:  Brown stool, strongly heme positive.  QC passed. Musculoskeletal: No lower extremity tenderness nor edema.  No joint effusions.   Neurologic:  Normal speech  and language. No gross focal neurologic deficits are appreciated.  Skin:  Skin is warm, dry and intact. No rash noted.  Pale.  ____________________________________________   LABS (all labs ordered are listed, but only abnormal results are displayed)  Labs Reviewed  URINALYSIS COMPLETEWITH MICROSCOPIC ([facility] ONLY) - Abnormal; Notable for the following:    Color, Urine YELLOW (*)    APPearance CLEAR (*)    Glucose, UA 150 (*)    Ketones, ur TRACE (*)    Protein, ur 100 (*)    Bacteria, UA RARE (*)    Squamous Epithelial / LPF 0-5 (*)    All other components within normal limits  CBC WITH DIFFERENTIAL/PLATELET - Abnormal; Notable for the following:    RBC 2.39 (*)    Hemoglobin 7.7 (*)    HCT 23.2 (*)    RDW 15.3 (*)    Neutro Abs 8.0 (*)    Lymphs Abs 0.9 (*)    All other  components within normal limits  TROPONIN I - Abnormal; Notable for the following:    Troponin I 0.05 (*)    All other components within normal limits  COMPREHENSIVE METABOLIC PANEL - Abnormal; Notable for the following:    CO2 19 (*)    BUN 63 (*)    Creatinine, Ser 8.19 (*)    Calcium 8.8 (*)    AST 11 (*)    ALT 8 (*)    GFR calc non Af Amer 6 (*)    GFR calc Af Amer 7 (*)    All other components within normal limits  MAGNESIUM  PROTIME-INR  APTT   ____________________________________________  EKG  ED ECG REPORT I, Ysabella Babiarz, the attending physician, personally viewed and interpreted this ECG.  Date: 11/10/2014 EKG Time: 21:51 Rate: 77 Rhythm: normal sinus rhythm QRS Axis: normal Intervals: normal ST/T Wave abnormalities: normal Conduction Disutrbances: none Narrative Interpretation: tall T waves in leads V3 and V4  ____________________________________________  RADIOLOGY   Dg Chest Portable 1 View  11/09/2014   CLINICAL DATA:  Generalize weakness today. Shortness of breath and fever. Dialysis patient.  EXAM: PORTABLE CHEST 1 VIEW  COMPARISON:  09/28/2014  FINDINGS: Cardiac enlargement with mild vascular congestion. No edema or consolidation in the lungs. No blunting of costophrenic angles. No pneumothorax. Mediastinal contours appear intact. Thoracic scoliosis convex towards the right. Postoperative changes in the cervical spine and left shoulder.  IMPRESSION: Cardiac enlargement with mild pulmonary vascular congestion. No edema or consolidation.   Electronically Signed   By: Lucienne Capers M.D.   On: 11/09/2014 22:52    ____________________________________________   PROCEDURES  Procedure(s) performed: None  Critical Care performed: No ____________________________________________   INITIAL IMPRESSION / ASSESSMENT AND PLAN / ED COURSE  Pertinent labs & imaging results that were available during my care of the patient were reviewed by me and considered  in my medical decision making (see chart for details).  Pulmonary vascular congestion.  Labs unremarkable including electrolytes except for new acute on chronic anemia with hemoglobin 7.7 (was 2 points higher one month ago) with heme positive stool.  Will admit for management of his symptomatic anemia, persistent GI bleeding, shortness of breath.  I will hold on blood transfusion at this time given the patient is 2 days past dialysis and is short of breath with pulmonary vascular congestion.  ____________________________________________  FINAL CLINICAL IMPRESSION(S) / ED DIAGNOSES  Final diagnoses:  Symptomatic anemia  Generalized weakness  Shortness of breath  Gastrointestinal hemorrhage, unspecified gastritis, unspecified gastrointestinal  hemorrhage type      NEW MEDICATIONS STARTED DURING THIS VISIT:  New Prescriptions   No medications on file     Hinda Kehr, MD 11/10/14 [phone]

## 2014-11-09 NOTE — ED Notes (Signed)
Pt arrives via EMS. Per report from the daughter, the pt has had generalized weakness today. He was not able to go to dialysis because he felt so weak today. enroute cbg 107, pt hypertensive, 190/81

## 2014-11-10 ENCOUNTER — Encounter: Payer: Self-pay | Admitting: Internal Medicine

## 2014-11-10 DIAGNOSIS — J810 Acute pulmonary edema: Secondary | ICD-10-CM | POA: Diagnosis present

## 2014-11-10 DIAGNOSIS — E785 Hyperlipidemia, unspecified: Secondary | ICD-10-CM | POA: Diagnosis present

## 2014-11-10 DIAGNOSIS — D122 Benign neoplasm of ascending colon: Secondary | ICD-10-CM | POA: Diagnosis present

## 2014-11-10 DIAGNOSIS — D124 Benign neoplasm of descending colon: Secondary | ICD-10-CM | POA: Diagnosis present

## 2014-11-10 DIAGNOSIS — E1122 Type 2 diabetes mellitus with diabetic chronic kidney disease: Secondary | ICD-10-CM | POA: Diagnosis present

## 2014-11-10 DIAGNOSIS — R569 Unspecified convulsions: Secondary | ICD-10-CM | POA: Diagnosis present

## 2014-11-10 DIAGNOSIS — K2901 Acute gastritis with bleeding: Secondary | ICD-10-CM | POA: Diagnosis present

## 2014-11-10 DIAGNOSIS — Z79891 Long term (current) use of opiate analgesic: Secondary | ICD-10-CM | POA: Diagnosis not present

## 2014-11-10 DIAGNOSIS — R32 Unspecified urinary incontinence: Secondary | ICD-10-CM | POA: Diagnosis present

## 2014-11-10 DIAGNOSIS — F329 Major depressive disorder, single episode, unspecified: Secondary | ICD-10-CM | POA: Diagnosis present

## 2014-11-10 DIAGNOSIS — N2581 Secondary hyperparathyroidism of renal origin: Secondary | ICD-10-CM | POA: Diagnosis present

## 2014-11-10 DIAGNOSIS — K219 Gastro-esophageal reflux disease without esophagitis: Secondary | ICD-10-CM | POA: Diagnosis present

## 2014-11-10 DIAGNOSIS — F419 Anxiety disorder, unspecified: Secondary | ICD-10-CM | POA: Diagnosis present

## 2014-11-10 DIAGNOSIS — I69351 Hemiplegia and hemiparesis following cerebral infarction affecting right dominant side: Secondary | ICD-10-CM | POA: Diagnosis not present

## 2014-11-10 DIAGNOSIS — Z79899 Other long term (current) drug therapy: Secondary | ICD-10-CM | POA: Diagnosis not present

## 2014-11-10 DIAGNOSIS — I6523 Occlusion and stenosis of bilateral carotid arteries: Secondary | ICD-10-CM | POA: Diagnosis present

## 2014-11-10 DIAGNOSIS — Z882 Allergy status to sulfonamides status: Secondary | ICD-10-CM | POA: Diagnosis not present

## 2014-11-10 DIAGNOSIS — Y929 Unspecified place or not applicable: Secondary | ICD-10-CM | POA: Diagnosis not present

## 2014-11-10 DIAGNOSIS — T39015A Adverse effect of aspirin, initial encounter: Secondary | ICD-10-CM | POA: Diagnosis present

## 2014-11-10 DIAGNOSIS — K922 Gastrointestinal hemorrhage, unspecified: Secondary | ICD-10-CM | POA: Diagnosis present

## 2014-11-10 DIAGNOSIS — D62 Acute posthemorrhagic anemia: Secondary | ICD-10-CM | POA: Diagnosis present

## 2014-11-10 DIAGNOSIS — D500 Iron deficiency anemia secondary to blood loss (chronic): Secondary | ICD-10-CM | POA: Diagnosis present

## 2014-11-10 DIAGNOSIS — I120 Hypertensive chronic kidney disease with stage 5 chronic kidney disease or end stage renal disease: Secondary | ICD-10-CM | POA: Diagnosis present

## 2014-11-10 DIAGNOSIS — N186 End stage renal disease: Secondary | ICD-10-CM | POA: Diagnosis present

## 2014-11-10 DIAGNOSIS — Z87891 Personal history of nicotine dependence: Secondary | ICD-10-CM | POA: Diagnosis not present

## 2014-11-10 DIAGNOSIS — N400 Enlarged prostate without lower urinary tract symptoms: Secondary | ICD-10-CM | POA: Diagnosis present

## 2014-11-10 DIAGNOSIS — G2581 Restless legs syndrome: Secondary | ICD-10-CM | POA: Diagnosis present

## 2014-11-10 DIAGNOSIS — D631 Anemia in chronic kidney disease: Secondary | ICD-10-CM | POA: Diagnosis present

## 2014-11-10 DIAGNOSIS — M109 Gout, unspecified: Secondary | ICD-10-CM | POA: Diagnosis present

## 2014-11-10 DIAGNOSIS — Z7982 Long term (current) use of aspirin: Secondary | ICD-10-CM | POA: Diagnosis not present

## 2014-11-10 DIAGNOSIS — K2270 Barrett's esophagus without dysplasia: Secondary | ICD-10-CM | POA: Diagnosis present

## 2014-11-10 DIAGNOSIS — R195 Other fecal abnormalities: Secondary | ICD-10-CM | POA: Diagnosis present

## 2014-11-10 DIAGNOSIS — Z992 Dependence on renal dialysis: Secondary | ICD-10-CM | POA: Diagnosis not present

## 2014-11-10 DIAGNOSIS — K921 Melena: Secondary | ICD-10-CM | POA: Diagnosis present

## 2014-11-10 DIAGNOSIS — I739 Peripheral vascular disease, unspecified: Secondary | ICD-10-CM | POA: Diagnosis present

## 2014-11-10 DIAGNOSIS — J449 Chronic obstructive pulmonary disease, unspecified: Secondary | ICD-10-CM | POA: Diagnosis present

## 2014-11-10 DIAGNOSIS — Z86010 Personal history of colonic polyps: Secondary | ICD-10-CM | POA: Diagnosis not present

## 2014-11-10 LAB — BASIC METABOLIC PANEL
Anion gap: 12 (ref 5–15)
BUN: 64 mg/dL — AB (ref 6–20)
CALCIUM: 8.8 mg/dL — AB (ref 8.9–10.3)
CO2: 19 mmol/L — ABNORMAL LOW (ref 22–32)
CREATININE: 8.43 mg/dL — AB (ref 0.61–1.24)
Chloride: 109 mmol/L (ref 101–111)
GFR, EST AFRICAN AMERICAN: 7 mL/min — AB (ref >60)
GFR, EST NON AFRICAN AMERICAN: 6 mL/min — AB (ref >60)
Glucose, Bld: 89 mg/dL (ref 65–99)
Potassium: 4.6 mmol/L (ref 3.5–5.1)
SODIUM: 140 mmol/L (ref 135–145)

## 2014-11-10 LAB — CBC
HEMATOCRIT: 22.1 % — AB (ref 40.0–52.0)
HEMATOCRIT: 21.9 % — AB (ref 40.0–52.0)
HEMOGLOBIN: 7.5 g/dL — AB (ref 13.0–18.0)
HEMOGLOBIN: 7.1 g/dL — AB (ref 13.0–18.0)
MCH: 33.3 pg (ref 26.0–34.0)
MCH: 31.6 pg (ref 26.0–34.0)
MCHC: 34 g/dL (ref 32.0–36.0)
MCHC: 32.4 g/dL (ref 32.0–36.0)
MCV: 97.8 fL (ref 80.0–100.0)
MCV: 97.3 fL (ref 80.0–100.0)
Platelets: 228 10*3/uL (ref 150–440)
Platelets: 227 10*3/uL (ref 150–440)
RBC: 2.26 MIL/uL — AB (ref 4.40–5.90)
RBC: 2.25 MIL/uL — AB (ref 4.40–5.90)
RDW: 15.6 % — ABNORMAL HIGH (ref 11.5–14.5)
RDW: 15.5 % — ABNORMAL HIGH (ref 11.5–14.5)
WBC: 7 10*3/uL (ref 3.8–10.6)
WBC: 6.9 10*3/uL (ref 3.8–10.6)

## 2014-11-10 LAB — PROTIME-INR
INR: 1.26
PROTHROMBIN TIME: 16 s — AB (ref 11.4–15.0)

## 2014-11-10 LAB — APTT: APTT: 36 s (ref 24–36)

## 2014-11-10 LAB — HEMOGLOBIN: HEMOGLOBIN: 7.5 g/dL — AB (ref 13.0–18.0)

## 2014-11-10 LAB — PHOSPHORUS: Phosphorus: 4.6 mg/dL (ref 2.5–4.6)

## 2014-11-10 MED ORDER — LOSARTAN POTASSIUM 50 MG PO TABS
25.0000 mg | ORAL_TABLET | Freq: Every evening | ORAL | Status: DC
  Administered 2014-11-10 – 2014-11-12 (×3): 25 mg via ORAL
  Filled 2014-11-10 (×3): qty 1

## 2014-11-10 MED ORDER — OXYCODONE HCL 5 MG PO TABS: 5.0000 mg | ORAL_TABLET | ORAL | Status: DC | PRN

## 2014-11-10 MED ORDER — HEPARIN SODIUM (PORCINE) 1000 UNIT/ML DIALYSIS
1000.0000 [IU] | INTRAMUSCULAR | Status: DC | PRN
  Filled 2014-11-10: qty 1

## 2014-11-10 MED ORDER — ACETAMINOPHEN 650 MG RE SUPP: 650.0000 mg | Freq: Four times a day (QID) | RECTAL | Status: DC | PRN

## 2014-11-10 MED ORDER — LORATADINE 10 MG PO TABS: 10.0000 mg | ORAL_TABLET | Freq: Every day | ORAL | Status: DC | PRN

## 2014-11-10 MED ORDER — ALLOPURINOL 100 MG PO TABS
100.0000 mg | ORAL_TABLET | Freq: Every evening | ORAL | Status: DC
  Administered 2014-11-10 – 2014-11-12 (×3): 100 mg via ORAL
  Filled 2014-11-10 (×3): qty 1

## 2014-11-10 MED ORDER — PENTAFLUOROPROP-TETRAFLUOROETH EX AERO: 1.0000 "application " | INHALATION_SPRAY | CUTANEOUS | Status: DC | PRN

## 2014-11-10 MED ORDER — ROPINIROLE HCL 1 MG PO TABS
2.0000 mg | ORAL_TABLET | Freq: Every day | ORAL | Status: DC
  Administered 2014-11-10 – 2014-11-11 (×2): 2 mg via ORAL
  Filled 2014-11-10: qty 8
  Filled 2014-11-10 (×2): qty 2

## 2014-11-10 MED ORDER — ALTEPLASE 2 MG IJ SOLR
2.0000 mg | Freq: Once | INTRAMUSCULAR | Status: DC | PRN
  Filled 2014-11-10: qty 2

## 2014-11-10 MED ORDER — HYDRALAZINE HCL 20 MG/ML IJ SOLN: 10.0000 mg | INTRAMUSCULAR | Status: DC | PRN

## 2014-11-10 MED ORDER — AMLODIPINE BESYLATE 10 MG PO TABS
10.0000 mg | ORAL_TABLET | ORAL | Status: DC
  Administered 2014-11-10 – 2014-11-12 (×2): 10 mg via ORAL
  Filled 2014-11-10 (×2): qty 1

## 2014-11-10 MED ORDER — SODIUM CHLORIDE 0.9 % IV SOLN: 100.0000 mL | INTRAVENOUS | Status: DC | PRN

## 2014-11-10 MED ORDER — ATORVASTATIN CALCIUM 20 MG PO TABS
40.0000 mg | ORAL_TABLET | Freq: Every evening | ORAL | Status: DC
  Administered 2014-11-10 – 2014-11-12 (×3): 40 mg via ORAL
  Filled 2014-11-10 (×3): qty 2

## 2014-11-10 MED ORDER — SODIUM CHLORIDE 0.9 % IJ SOLN
3.0000 mL | Freq: Two times a day (BID) | INTRAMUSCULAR | Status: DC
  Administered 2014-11-10 – 2014-11-12 (×5): 3 mL via INTRAVENOUS

## 2014-11-10 MED ORDER — POLYVINYL ALCOHOL 1.4 % OP SOLN: 1.0000 [drp] | OPHTHALMIC | Status: DC | PRN

## 2014-11-10 MED ORDER — AZELASTINE HCL 0.1 % NA SOLN: 2.0000 | Freq: Every day | NASAL | Status: DC | PRN

## 2014-11-10 MED ORDER — DIPHENHYDRAMINE HCL 25 MG PO CAPS: 25.0000 mg | ORAL_CAPSULE | Freq: Every evening | ORAL | Status: DC | PRN

## 2014-11-10 MED ORDER — POLYETHYLENE GLYCOL 3350 17 GM/SCOOP PO POWD
1.0000 | Freq: Once | ORAL | Status: AC
  Administered 2014-11-10: 255 g via ORAL
  Filled 2014-11-10 (×2): qty 255

## 2014-11-10 MED ORDER — FUROSEMIDE 40 MG PO TABS
80.0000 mg | ORAL_TABLET | ORAL | Status: DC
  Administered 2014-11-10 – 2014-11-12 (×2): 80 mg via ORAL
  Filled 2014-11-10 (×2): qty 2

## 2014-11-10 MED ORDER — PANTOPRAZOLE SODIUM 40 MG PO TBEC
40.0000 mg | DELAYED_RELEASE_TABLET | Freq: Every day | ORAL | Status: DC
  Administered 2014-11-10 – 2014-11-12 (×3): 40 mg via ORAL
  Filled 2014-11-10 (×3): qty 1

## 2014-11-10 MED ORDER — SEVELAMER CARBONATE 800 MG PO TABS
1600.0000 mg | ORAL_TABLET | Freq: Three times a day (TID) | ORAL | Status: DC
  Administered 2014-11-10 – 2014-11-12 (×6): 1600 mg via ORAL
  Filled 2014-11-10 (×7): qty 2

## 2014-11-10 MED ORDER — LIDOCAINE HCL (PF) 1 % IJ SOLN
5.0000 mL | INTRAMUSCULAR | Status: DC | PRN
  Filled 2014-11-10: qty 5

## 2014-11-10 MED ORDER — IPRATROPIUM-ALBUTEROL 0.5-2.5 (3) MG/3ML IN SOLN
3.0000 mL | RESPIRATORY_TRACT | Status: DC | PRN
  Filled 2014-11-10: qty 3

## 2014-11-10 MED ORDER — FUROSEMIDE 10 MG/ML IJ SOLN
80.0000 mg | Freq: Once | INTRAMUSCULAR | Status: AC
  Administered 2014-11-10: 80 mg via INTRAVENOUS
  Filled 2014-11-10: qty 8

## 2014-11-10 MED ORDER — CINACALCET HCL 30 MG PO TABS
30.0000 mg | ORAL_TABLET | Freq: Every evening | ORAL | Status: DC
  Administered 2014-11-10 – 2014-11-12 (×3): 30 mg via ORAL
  Filled 2014-11-10 (×4): qty 1

## 2014-11-10 MED ORDER — ACETAMINOPHEN 325 MG PO TABS: 650.0000 mg | ORAL_TABLET | Freq: Four times a day (QID) | ORAL | Status: DC | PRN

## 2014-11-10 MED ORDER — LIDOCAINE-PRILOCAINE 2.5-2.5 % EX CREA
1.0000 "application " | TOPICAL_CREAM | CUTANEOUS | Status: DC | PRN
  Filled 2014-11-10 (×2): qty 5

## 2014-11-10 MED ORDER — CYCLOSPORINE 0.05 % OP EMUL
1.0000 [drp] | Freq: Two times a day (BID) | OPHTHALMIC | Status: DC
  Administered 2014-11-10 – 2014-11-12 (×4): 1 [drp] via OPHTHALMIC
  Filled 2014-11-10 (×6): qty 1

## 2014-11-10 MED ORDER — IPRATROPIUM-ALBUTEROL 0.5-2.5 (3) MG/3ML IN SOLN: 3.0000 mL | RESPIRATORY_TRACT | Status: DC | PRN

## 2014-11-10 NOTE — Progress Notes (Signed)
[name] NAME: Troy Gallagher    MR#:  [id number]  DATE OF BIRTH:  06/11/1945  SUBJECTIVE:  CHIEF COMPLAINT:   Chief Complaint  Patient presents with  . Weakness   - Patient with melena presents to the hospital with significant weakness -Hemoglobin dropped from 9-7. -Dialysis patient, due for dialysis today  REVIEW OF SYSTEMS:  Review of Systems  Constitutional: Negative for fever and chills.  HENT: Negative for ear discharge and nosebleeds.   Eyes: Negative for blurred vision and double vision.  Respiratory: Negative for cough, shortness of breath and wheezing.   Cardiovascular: Negative for chest pain and palpitations.  Gastrointestinal: Positive for melena. Negative for nausea, vomiting, abdominal pain, diarrhea and constipation.  Genitourinary: Negative for dysuria.  Neurological: Positive for tremors, focal weakness and weakness. Negative for dizziness, seizures and headaches.    DRUG ALLERGIES:   Allergies  Allergen Reactions  . Sulfur Hives    VITALS:  Blood pressure 156/61, pulse 72, temperature 98.7 F (37.1 C), temperature source Oral, resp. rate 22, height 5\' 9"  (1.753 m), weight 104 kg (229 lb 4.5 oz), SpO2 98 %.  PHYSICAL EXAMINATION:  Physical Exam  GENERAL:  69 y.o.-year-old patient lying in the bed with no acute distress.  EYES: Pupils equal, round, reactive to light and accommodation. No scleral icterus. Extraocular muscles intact. Frequent eye blinking noted which is chronic HEENT: Head atraumatic, normocephalic. Oropharynx and nasopharynx clear.  NECK:  Supple, no jugular venous distention. No thyroid enlargement, no tenderness.  LUNGS: Normal breath sounds bilaterally, no wheezing, rhonchi or crepitation. No use of accessory muscles of respiration. Bibasilar crackles noted. CARDIOVASCULAR: S1, S2 normal. No rubs, or gallops. 3/6 systolic murmur present ABDOMEN: Soft, nontender, nondistended.  Bowel sounds present. No organomegaly or mass.  EXTREMITIES: No pedal edema, cyanosis, or clubbing.  NEUROLOGIC: Cranial nerves II through XII are intact. Muscle strength 5/5 on left side and 4/5 on the right side. Sensation intact. Gait not checked.  PSYCHIATRIC: The patient is alert and oriented x 3.  SKIN: No obvious rash, lesion, or ulcer.    LABORATORY PANEL:   CBC  Recent Labs Lab 11/10/14 1033  WBC 6.9  HGB 7.1*  HCT 21.9*  PLT 227   ------------------------------------------------------------------------------------------------------------------  Chemistries   Recent Labs Lab 11/09/14 2250 11/10/14 0512  NA 140 140  K 5.0 4.6  CL 110 109  CO2 19* 19*  GLUCOSE 94 89  BUN 63* 64*  CREATININE 8.19* 8.43*  CALCIUM 8.8* 8.8*  MG 1.7  --   AST 11*  --   ALT 8*  --   ALKPHOS 72  --   BILITOT 1.0  --    ------------------------------------------------------------------------------------------------------------------  Cardiac Enzymes  Recent Labs Lab 11/09/14 2250  TROPONINI 0.05*   ------------------------------------------------------------------------------------------------------------------  RADIOLOGY:  Dg Chest Portable 1 View  11/09/2014  CLINICAL DATA:  Generalize weakness today. Shortness of breath and fever. Dialysis patient. EXAM: PORTABLE CHEST 1 VIEW COMPARISON:  09/28/2014 FINDINGS: Cardiac enlargement with mild vascular congestion. No edema or consolidation in the lungs. No blunting of costophrenic angles. No pneumothorax. Mediastinal contours appear intact. Thoracic scoliosis convex towards the right. Postoperative changes in the cervical spine and left shoulder. IMPRESSION: Cardiac enlargement with mild pulmonary vascular congestion. No edema or consolidation. Electronically Signed   By: Lucienne Capers M.D.   On: 11/09/2014 22:52    EKG:   Orders placed or performed during the hospital encounter of 11/09/14  .  ED EKG  . ED EKG  . EKG  12-Lead  . EKG 12-Lead    ASSESSMENT AND PLAN:   69 year old male with recent stroke about a month ago, status post rehabilitation, end-stage renal disease on Monday Wednesday Friday hemodialysis, hypertension hyperlipidemia, diabetes mellitus proximal from home secondary to weakness, worsening breathing and melena.  #1 melena-with acute on chronic anemia noted. -drop in hemoglobin from 9 to 7 noted -GI consult pending. -EGD and colonoscopy likely tomorrow. No history of prior colon polyps and also gastritis. -Continue Protonix for now. -Transfusion if hemoglobin is less than 7 -Avoid NSAIDS  #2 acute pulmonary edema-due for dialysis today. -Continue Lasix. -Nephrology consulted for dialysis today.  #3 end-stage renal disease on hemodialysis-Monday Wednesday Friday schedule, -Due for dialysis today.  #4 hypertension-on Norvasc, losartan. Labetalol is on hold  #5 recent CVA-with right-sided weakness noted. Just finished rehabilitation. Continue statin -Aspirin on hold due to GI bleed. Can be restarted at discharge unless contradicted by GI  #6 DVT prophylaxis - subcutaneous heparin   All the records are reviewed and case discussed with Care Management/Social Workerr. Management plans discussed with the patient, family and they are in agreement.  CODE STATUS: Full code  TOTAL TIME TAKING CARE OF THIS PATIENT: 37 minutes.   POSSIBLE D/C IN 2-3 DAYS, DEPENDING ON CLINICAL CONDITION.   Gladstone Lighter M.D on 11/10/2014 at 2:56 PM  Between 7am to 6pm - Pager - [phone]  After 6pm go to [email or web address] - password [email or web address] [facility]  Office  [phone]  CC: Primary care physician; Lelon Huh, MD

## 2014-11-10 NOTE — Progress Notes (Signed)
HD tx completed.

## 2014-11-10 NOTE — Plan of Care (Addendum)
Problem: Discharge Progression Outcomes Goal: Other Discharge Outcomes/Goals Outcome: Progressing Plan of care progress to goal: No c/o pain. Clear liquid diet. Had dialysis today, pulled off 2L. Colonoscopy and EGD scheduled for tomorrow. BP slightly elevated today. BP meds given.

## 2014-11-10 NOTE — Progress Notes (Signed)
HD tx start 

## 2014-11-10 NOTE — Consult Note (Signed)
  Pt seen and examined. Please see C. Celesta Aver' notes. Pt with known hx of gastritis/Barrett's and multiple colon polyps. Due for repeat EGD in 2018 and colonoscopy in 2017. However, hgb dropping with heme positive stool on ASA 325 mg daily after CVA. Getting HD now. Will prep pt for repeat colonoscopy and EGD tomorrow AM. Thanks.

## 2014-11-10 NOTE — Consult Note (Signed)
GI Inpatient Consult Note  Reason for Consult: GI Bleed   Attending Requesting Consult: Dr. Lavetta Nielsen  History of Present Illness: Troy Gallagher is a 69 y.o. male With a significant past medical history of end-stage renal disease, dialysis on Monday, Wednesday, Friday, recent TIA 1 month ago, COPD, carotid stenosis.  Patient presented to the emergency department after having weakness for the past 2 days along with shortness of breath.  He had a hemoglobin on admission of 7.7, currently 7.1.  He was heme-positive brown stool in the emergency department.  Patient reports he missed his last dialysis due to weakness.  He reports he has had shortness of breath, does not know when it happens, unable to describe.  He denies chest pain, abdominal pain, heartburn, acid reflux, reports that his stools have been looser than normal, denies any color change to his stools.  He takes Nexium 40 mg twice a day and started on a 325 mg aspirin daily since his TIA.  He is well known in our office.  He had a colonoscopy completed on Jun 14, 2014, with Dr. Davonna Belling, for indication of personal history of colonic polyps.  Impression ; 3 medium polyps in the ascending colon, 2 small polyps in the descending colon, 2 small polyps in the sigmoid colon, pathology showed tubular adenoma, traditional serrated adenoma, and tubulovillous adenoma, traditional serrated adenoma.  Negative for high-grade dysplasia and malignancy.  Due for repeat in 1 year.  He had an upper endoscopy completed on December 31, 2013.  Dr. Davonna Belling.  Indications ; iron deficiency anemia, heme-positive stool, melena, follow-up of Barrett's esophagus.  Impression: esophageal mucosal changes suspicious for short-segment Barrett's esophagus.  Examination was otherwise normal.  Erythematous mucosa in the antrum.  The examination was otherwise normal.  Normal examined duodenum.  Pathology positive for gastritis and Barrett's.  Recommendation daily PPI and follow up in December,  2018.  Past Medical History:  Past Medical History  Diagnosis Date  . Complication of anesthesia   . Hypertension   . Stroke ([place])   . GERD (gastroesophageal reflux disease)   . Headache   . Kidney dialysis status [id number]  . HLD (hyperlipidemia)   . Diabetes mellitus, type II ([place])   . Seizure ([place])   . BPH (benign prostatic hyperplasia)   . COPD (chronic obstructive pulmonary disease) ([place])   . PVD (peripheral vascular disease) ([place])   . Carotid stenosis   . Adenoma of large intestine   . ESRD on hemodialysis ([place])     MWF  . Anxiety   . RLS (restless legs syndrome)     Problem List: Patient Active Problem List   Diagnosis Date Noted  . GIB (gastrointestinal bleeding) 11/10/2014  . TIA (transient ischemic attack) 10/04/2014  . Rotator cuff tear arthropathy of right shoulder 10/03/2014  . Generalized weakness 10/01/2014  . Elevated troponin 09/30/2014  . Weakness generalized 09/30/2014  . ESRD on hemodialysis ([place]) 09/30/2014  . GERD (gastroesophageal reflux disease) 09/30/2014  . HTN (hypertension) 09/30/2014  . HLD (hyperlipidemia) 09/30/2014  . Type 2 diabetes mellitus ([place]) 09/30/2014  . Anxiety 09/30/2014  . RLS (restless legs syndrome) 09/30/2014  . History of CVA (cerebrovascular accident) 09/28/2014  . Symptoms, musculoskeletal, limb NEC 09/28/2014  . Anemia 09/28/2014  . Hyperlipidemia, mixed 09/28/2014  . Bilateral carotid artery stenosis 12/04/2011  . Depressive disorder 01/29/1998    Past Surgical History: Past Surgical History  Procedure Laterality Date  . Shoulder arthroscopy w/ rotator cuff repair Left 1999  .  Neck surgery N/A 10/24/2009    Cervical Diskectomy- C3-4, 4-5, 5-6 anterior disckectomy by Dr. Arnoldo Morale at [facility]  . Av fistula placement Left 2 years  . Colonoscopy N/A 06/14/2014    Procedure: COLONOSCOPY;  Surgeon: Hulen Luster, MD;  Location: [facility] ENDOSCOPY;  Service: Gastroenterology;  Laterality: N/A;  . Carotid  endarterectomy Right 06/05/2013    Dr. Delana Meyer  . Tonsillectomy and adenoidectomy  1950  . 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 Repeat in 3 years  . Carotid doppler ultrasound  04/22/2013    85-90% stenosis of the RICA, 60-70% stenosis of left cmmon carotid and origin of left subclavian  . Doppler echocardiography  12/04/2011    Bilateral stenosis 80%  . Arch aortogram  04/07/2013    85-90% Stenosis origin of right ICA. 65-70% narrowing of left CCA. 50-60% narrowing of left subclavian artery    Allergies: Allergies  Allergen Reactions  . Sulfur Hives    Home Medications: Prescriptions prior to admission  Medication Sig Dispense Refill Last Dose  . acetaminophen (TYLENOL) 325 MG tablet Take 650 mg by mouth every 6 (six) hours as needed for mild pain or headache.   PRN at PRN  . albuterol-ipratropium (COMBIVENT) 18-103 MCG/ACT inhaler Inhale 2 puffs into the lungs every 4 (four) hours as needed for wheezing or shortness of breath.   PRN at PRN  . allopurinol (ZYLOPRIM) 100 MG tablet Take 100 mg by mouth every evening.    Past Week at Unknown time  . ALPRAZolam (XANAX) 0.5 MG tablet Take 1 tablet (0.5 mg total) by mouth 2 (two) times daily. (Patient taking differently: Take 0.5 mg by mouth at bedtime. ) 180 tablet 1 Past Week at Unknown time  . amLODipine (NORVASC) 10 MG tablet Take 10 mg by mouth daily. Pt does not take on dialysis days.   10/04/2014 at Unknown time  . aspirin 325 MG tablet Take 1 tablet (325 mg total) by mouth daily. 30 tablet 0   . atorvastatin (LIPITOR) 40 MG tablet Take 40 mg by mouth every evening.    Past Week at Unknown time  . azelastine (ASTELIN) 0.1 % nasal spray Place 2 sprays into both nostrils daily as needed for rhinitis.    Past Month at Unknown time  . cinacalcet (SENSIPAR) 30 MG tablet Take 30 mg by mouth every evening.   Past Week at Unknown time  . cycloSPORINE (RESTASIS) 0.05 % ophthalmic emulsion  Place 1 drop into both eyes 2 (two) times daily. 0.4 mL 3 10/03/2014 at Unknown time  . diphenhydrAMINE (BENADRYL) 25 MG tablet Take 25 mg by mouth at bedtime as needed for itching or sleep.   PRN at PRN  . esomeprazole (NEXIUM) 40 MG capsule Take 1 capsule (40 mg total) by mouth 2 (two) times daily. 60 capsule 6 10/04/2014 at Unknown time  . furosemide (LASIX) 80 MG tablet Take 80 mg by mouth daily. Pt does not take on dialysis days.   Past Week at Unknown time  . indomethacin (INDOCIN) 50 MG capsule Take 50 mg by mouth 2 (two) times daily as needed for mild pain.    PRN at PRN  . labetalol (NORMODYNE) 100 MG tablet Take 1 tablet (100 mg total) by mouth 2 (two) times daily. 60 tablet 6   . lidocaine-prilocaine (EMLA) cream Apply 1 application topically as needed (before port access).    PRN at PRN  . loratadine (CLARITIN) 10  MG tablet Take 10 mg by mouth daily as needed for allergies.    PRN at PRN  . losartan (COZAAR) 25 MG tablet Take 25 mg by mouth every evening.   Past Week at Unknown time  . meclizine (ANTIVERT) 25 MG tablet Take 25 mg by mouth 2 (two) times daily as needed for dizziness.   10/04/2014 at Unknown time  . polyvinyl alcohol (LIQUIFILM TEARS) 1.4 % ophthalmic solution Place 1 drop into both eyes as needed for dry eyes. 15 mL 0   . potassium chloride SA (K-DUR,KLOR-CON) 20 MEQ tablet Take 20 mEq by mouth daily.   10/04/2014 at Unknown time  . rOPINIRole (REQUIP) 2 MG tablet Take 2 mg by mouth at bedtime.   Past Week at Unknown time  . sevelamer carbonate (RENVELA) 800 MG tablet Take 2 tablets (1,600 mg total) by mouth 3 (three) times daily with meals. 90 tablet 6   . traMADol-acetaminophen (ULTRACET) 37.5-325 MG per tablet Take 1 tablet by mouth every 6 (six) hours as needed. (Patient taking differently: Take 1 tablet by mouth every 6 (six) hours as needed for moderate pain. ) 40 tablet 0    Home medication reconciliation was completed with the patient.   Scheduled Inpatient  Medications:   . allopurinol  100 mg Oral QPM  . amLODipine  10 mg Oral Once per day on Sun Mon Wed Fri  . atorvastatin  40 mg Oral QPM  . cinacalcet  30 mg Oral QPM  . cycloSPORINE  1 drop Both Eyes BID  . furosemide  80 mg Oral Once per day on Sun Mon Wed Fri  . losartan  25 mg Oral QPM  . pantoprazole  40 mg Oral QAC breakfast  . rOPINIRole  2 mg Oral QHS  . sevelamer carbonate  1,600 mg Oral TID WC  . sodium chloride  3 mL Intravenous Q12H    Continuous Inpatient Infusions:     PRN Inpatient Medications:  acetaminophen **OR** acetaminophen, azelastine, diphenhydrAMINE, hydrALAZINE, ipratropium-albuterol, ipratropium-albuterol, loratadine, oxyCODONE, polyvinyl alcohol  Family History: family history includes Heart disease in his sister; Prostate cancer in his father; Stroke in his mother.    Social History:   reports that he quit smoking about 12 years ago. His smoking use included Cigarettes. He has a 60 pack-year smoking history. He uses smokeless tobacco. He reports that he does not drink alcohol or use illicit drugs. T  Review of Systems: Constitutional: Weight is stable.  Eyes: No changes in vision. ENT: No oral lesions, sore throat.  GI: see HPI.  Heme/Lymph: No easy bruising.  CV: No chest pain.  GU: No hematuria.  Integumentary: No rashes.  Neuro: No headaches.  Psych: No depression/anxiety.  Endocrine: No heat/cold intolerance.  Allergic/Immunologic: No urticaria.  Resp: No coug Musculoskeletal: No joint swelling.    Physical Examination: BP 160/56 mmHg  Pulse 68  Temp(Src) 98.6 F (37 C) (Oral)  Resp 20  Ht 5\' 9"  (1.753 m)  Wt 103.874 kg (229 lb)  BMI 33.80 kg/m2  SpO2 98% Gen: NAD, alert and oriented x 4.  Patient was in dialysis during my visit. HEENT: PEERLA, EOMI, Neck: supple, no JVD or thyromegaly Chest: CTA bilaterally, no wheezes, crackles, or other adventitious sounds CV: RRR, no m/g/c/r Abd: soft, NT, ND, +BS in all four quadrants; no  HSM, guarding, ridigity, or rebound tenderness Ext: no edema, well perfused with 2+ pulses, Skin: no rash or lesions noted Lymph: no LAD  Data: Lab Results  Component Value  Date   WBC 6.9 11/10/2014   HGB 7.1* 11/10/2014   HCT 21.9* 11/10/2014   MCV 97.3 11/10/2014   PLT 227 11/10/2014    Recent Labs Lab 11/09/14 2250 11/10/14 0512 11/10/14 1033  HGB 7.7* 7.5* 7.1*   Lab Results  Component Value Date   NA 140 11/10/2014   K 4.6 11/10/2014   CL 109 11/10/2014   CO2 19* 11/10/2014   BUN 64* 11/10/2014   CREATININE 8.43* 11/10/2014   GLU 103 04/02/2014   Lab Results  Component Value Date   ALT 8* 11/09/2014   AST 11* 11/09/2014   ALKPHOS 72 11/09/2014   BILITOT 1.0 11/09/2014    Recent Labs Lab 11/09/14 2250  APTT 36  INR 1.26     Assessment/Plan: Mr. Greenlaw is a 69 y.o. male  with ESRD , dialysis m/w/f, recent TIA one month ago, SOB over past two days along with weakness, presented to ED with heme positive brown stool, Hgb 7.7.  History of Barrett's and gastritis, and serrated polyps.  Last colon was 05/2014, last egd 12/2013  Recommendations: We will proceed with an upper endoscopy and colonoscopy tomorrow for further evaluation.  We agree with Protonix and continuing to monitor hgb.  We will continue to follow with you. Thank you for the consult. Please call with questions or concerns.  Salvadore Farber, PA-C  I personally performed these services.

## 2014-11-10 NOTE — Care Management Important Message (Signed)
Important Message  Patient Details  Name: Troy Gallagher MRN: [id number] Date of Birth: 08/24/1945   Medicare Important Message Given:  Yes-second notification given    Shelbie Ammons, RN 11/10/2014, 8:31 AM

## 2014-11-10 NOTE — Progress Notes (Signed)
[facility]  ROUNDING NOTE   Subjective:  Patient well-known to [place]. We follow him for end-stage renal disease. He now presents with weakness, shortness of breath. He was found to have heme positive stools in the emergency department with a hemoglobin of 7.7 upon admission.Has been evaluated by gastroenterology and due for colonoscopy and EGD tomorrow. Patient is also due for dialysis today.   Objective:  Vital signs in last 24 hours:  Temp:  [98.6 F (37 C)-101 F (38.3 C)] 98.7 F (37.1 C) (10/12 1220) Pulse Rate:  [65-78] 72 (10/12 1430) Resp:  [20-28] 22 (10/12 1430) BP: (139-183)/(56-89) 156/61 mmHg (10/12 1430) SpO2:  [95 %-99 %] 98 % (10/12 1430) Weight:  [103.874 kg (229 lb)-108.863 kg (240 lb)] 104 kg (229 lb 4.5 oz) (10/12 1220)  Weight change:  Filed Weights   11/09/14 2154 11/10/14 0157 11/10/14 1220  Weight: 108.863 kg (240 lb) 103.874 kg (229 lb) 104 kg (229 lb 4.5 oz)    Intake/Output:     Intake/Output this shift:  Total I/O In: 760 [P.O.:760] Out: -   Physical Exam: General: NAD  Head: Normocephalic, atraumatic. Moist oral mucosal membranes  Eyes: Anicteric, PERRL  Neck: Supple, trachea midline  Lungs:  Clear to auscultation normal effort  Heart: Regular rate and rhythm no rubs  Abdomen:  Soft, nontender, BS present  Extremities:  1+ peripheral edema.  Neurologic: Nonfocal, moving all four extremities  Skin: No lesions  Access: LUE AVF    Basic Metabolic Panel:  Recent Labs Lab 11/09/14 2250 11/10/14 0512 11/10/14 1230  NA 140 140  --   K 5.0 4.6  --   CL 110 109  --   CO2 19* 19*  --   GLUCOSE 94 89  --   BUN 63* 64*  --   CREATININE 8.19* 8.43*  --   CALCIUM 8.8* 8.8*  --   MG 1.7  --   --   PHOS  --   --  4.6    Liver Function Tests:  Recent Labs Lab 11/09/14 2250  AST 11*  ALT 8*  ALKPHOS 72  BILITOT 1.0  PROT 7.3  ALBUMIN 3.5   No results for input(s): LIPASE, AMYLASE in the last 168 hours. No  results for input(s): AMMONIA in the last 168 hours.  CBC:  Recent Labs Lab 11/09/14 2250 11/10/14 0512 11/10/14 1033  WBC 10.0 7.0 6.9  NEUTROABS 8.0*  --   --   HGB 7.7* 7.5* 7.1*  HCT 23.2* 22.1* 21.9*  MCV 97.0 97.8 97.3  PLT 263 228 227    Cardiac Enzymes:  Recent Labs Lab 11/09/14 2250  TROPONINI 0.05*    BNP: Invalid input(s): POCBNP  CBG: No results for input(s): GLUCAP in the last 168 hours.  Microbiology: Results for orders placed or performed during the hospital encounter of 10/24/09  Surgical pcr screen     Status: None   Collection Time: 10/19/09  2:01 PM  Result Value Ref Range Status   MRSA, PCR NEGATIVE NEGATIVE Final   Staphylococcus aureus  NEGATIVE Final    NEGATIVE        The Xpert SA Assay (FDA approved for NASAL specimens only), is one component of a comprehensive surveillance program.  It is not intended to diagnose infection nor to guide or monitor treatment.    Coagulation Studies:  Recent Labs  11/09/14 2250  LABPROT 16.0*  INR 1.26    Urinalysis:  Recent Labs  11/09/14 [place]  YELLOW*  LABSPEC 1.013  PHURINE 8.0  GLUCOSEU 150*  HGBUR NEGATIVE  BILIRUBINUR NEGATIVE  KETONESUR TRACE*  PROTEINUR 100*  NITRITE NEGATIVE  LEUKOCYTESUR NEGATIVE      Imaging: Dg Chest Portable 1 View  11/09/2014  CLINICAL DATA:  Generalize weakness today. Shortness of breath and fever. Dialysis patient. EXAM: PORTABLE CHEST 1 VIEW COMPARISON:  09/28/2014 FINDINGS: Cardiac enlargement with mild vascular congestion. No edema or consolidation in the lungs. No blunting of costophrenic angles. No pneumothorax. Mediastinal contours appear intact. Thoracic scoliosis convex towards the right. Postoperative changes in the cervical spine and left shoulder. IMPRESSION: Cardiac enlargement with mild pulmonary vascular congestion. No edema or consolidation. Electronically Signed   By: Lucienne Capers M.D.   On: 11/09/2014 22:52      Medications:     . allopurinol  100 mg Oral QPM  . amLODipine  10 mg Oral Once per day on Sun Mon Wed Fri  . atorvastatin  40 mg Oral QPM  . cinacalcet  30 mg Oral QPM  . cycloSPORINE  1 drop Both Eyes BID  . furosemide  80 mg Oral Once per day on Sun Mon Wed Fri  . losartan  25 mg Oral QPM  . pantoprazole  40 mg Oral QAC breakfast  . polyethylene glycol powder  1 Container Oral Once  . rOPINIRole  2 mg Oral QHS  . sevelamer carbonate  1,600 mg Oral TID WC  . sodium chloride  3 mL Intravenous Q12H   sodium chloride, sodium chloride, acetaminophen **OR** acetaminophen, alteplase, azelastine, diphenhydrAMINE, heparin, hydrALAZINE, ipratropium-albuterol, ipratropium-albuterol, lidocaine (PF), lidocaine-prilocaine, loratadine, oxyCODONE, pentafluoroprop-tetrafluoroeth, polyvinyl alcohol  Assessment/ Plan:  69 y.o. male with end-stage renal disease on hemodialysis Monday, Wednesday, Friday, hypertension, COPD, GERD, gout, left upper extremity AV fistula, restless leg syndrome, hyperlipidemia who was admitted for generalized weakness.  1. End-stage renal disease on hemodialysis Monday, Wednesday, Friday. Pt due for HD today, orders prepared, continue HD on MWF schedule.   2. Anemia chronic kidney disease/anemia blood loss. Hgb currently 7.1, would transfuse for hgb of 7 or less, defer to hospitalist.  EGD and colonoscopy tomorrow.  3. Secondary hyperparathyroidism. Continue sensipar and renvela.  Continue sensipar and renvela.    4. Hypertension: BP 156/61, continue amlodipine, losartan for now.  Volume removal should also help.    LOS: 0 Everlene Cunning 10/12/20162:56 PM

## 2014-11-10 NOTE — Progress Notes (Signed)
Pre-hd tx 

## 2014-11-10 NOTE — Progress Notes (Signed)
Post hd tx 

## 2014-11-10 NOTE — Plan of Care (Signed)
Problem: Discharge Progression Outcomes Goal: Barriers To Progression Addressed/Resolved Individualization of care Likes to be called Troy Gallagher. Lives at home with daughter. Do not walk, uses a wheel chair at home. A hemodialysis pt, On MWF. Patients medical history is extensive which includes hypertension, stroke, GERD, diabetes, COPD and anxiety, controlled by medications.  Goal: Other Discharge Outcomes/Goals Plan of care progress to goal: - No complaints of pain. - Assist with urinal at bedside. - A dialysis pt, possible dialysis today. - No active bleed this shift. Will continue to monitor.

## 2014-11-10 NOTE — H&P (Addendum)
Complete H&P performed during downtime please see scanned copy for further information  ----------------------------------------------------------------------------------------------------------- Handwritten H&P apparently lost please see below dictation --------------------------------------------------------------------------------------------------------------   [place] at [place] NAME: Troy Gallagher    MR#:  [id number]  [place]:  11/23/1945   DATE OF ADMISSION:  11/09/2014  PRIMARY CARE PHYSICIAN: Lelon Huh, MD   REQUESTING/REFERRING PHYSICIAN: Karma Greaser  CHIEF COMPLAINT:   Chief Complaint  Patient presents with  . Weakness    HISTORY OF PRESENT ILLNESS:  Troy Gallagher  is a 69 y.o. male with a known history of end-stage renal disease on hemodialysis Monday Wednesday Friday presenting with weakness. Patient describes one to 2 day duration of weakness with associated shortness of breath mainly described as a dipstick on exertion (minimal). He was so symptomatic he was unable to go to routine hemodialysis. The patient's main complaint is actually in regards to his respiratory status at this time. He does describe having a cough, nonproductive no associated fevers or chills, denies any chest pain or further symptomatology at this time. Emergency department course: He was noted to have lower hemoglobin 9.6 trending downward to 7 with positive fecal occult blood test. With symptoms and lab findings this is concerning for symptomatic anemia.  PAST MEDICAL HISTORY:   Past Medical History  Diagnosis Date  . Complication of anesthesia   . Hypertension   . Stroke ([place])   . GERD (gastroesophageal reflux disease)   . Headache   . Kidney dialysis status [id number]  . HLD (hyperlipidemia)   . Diabetes mellitus, type II ([place])   . Seizure ([place])   . BPH (benign prostatic hyperplasia)   . COPD (chronic obstructive pulmonary disease)  ([place])   . PVD (peripheral vascular disease) ([place])   . Carotid stenosis   . Adenoma of large intestine   . ESRD on hemodialysis ([place])     MWF  . Anxiety   . RLS (restless legs syndrome)     PAST SURGICAL HISTORY:   Past Surgical History  Procedure Laterality Date  . Shoulder arthroscopy w/ rotator cuff repair Left 1999  . Neck surgery N/A 10/24/2009    Cervical Diskectomy- C3-4, 4-5, 5-6 anterior disckectomy by Dr. Arnoldo Morale at [facility]  . Av fistula placement Left 2 years  . Colonoscopy N/A 06/14/2014    Procedure: COLONOSCOPY;  Surgeon: Hulen Luster, MD;  Location: [facility] ENDOSCOPY;  Service: Gastroenterology;  Laterality: N/A;  . Carotid endarterectomy Right 06/05/2013    Dr. Delana Meyer  . Tonsillectomy and adenoidectomy  1950  . 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 Repeat in 3 years  . Carotid doppler ultrasound  04/22/2013    85-90% stenosis of the RICA, 60-70% stenosis of left cmmon carotid and origin of left subclavian  . Doppler echocardiography  12/04/2011    Bilateral stenosis 80%  . Arch aortogram  04/07/2013    85-90% Stenosis origin of right ICA. 65-70% narrowing of left CCA. 50-60% narrowing of left subclavian artery  . Colonoscopy with propofol N/A 11/11/2014    Procedure: COLONOSCOPY WITH PROPOFOL;  Surgeon: Hulen Luster, MD;  Location: [facility] ENDOSCOPY;  Service: Endoscopy;  Laterality: N/A;  . Esophagogastroduodenoscopy (egd) with propofol N/A 11/11/2014    Procedure: ESOPHAGOGASTRODUODENOSCOPY (EGD) WITH PROPOFOL;  Surgeon: Hulen Luster, MD;  Location: [facility] ENDOSCOPY;  Service: Endoscopy;  Laterality: N/A;    SOCIAL HISTORY:   Social History  Substance  Use Topics  . Smoking status: Former Smoker -- 2.00 packs/day for 30 years    Types: Cigarettes    Quit date: 01/29/2002  . Smokeless tobacco: Current User  . Alcohol Use: No    FAMILY HISTORY:   Family History  Problem Relation Age of Onset  . Stroke  Mother   . Prostate cancer Father   . Heart disease Sister     DRUG ALLERGIES:   Allergies  Allergen Reactions  . Sulfur Hives    REVIEW OF SYSTEMS:  REVIEW OF SYSTEMS:  CONSTITUTIONAL: Denies fevers, chills,  positive fatigue, weakness.  EYES: Denies blurred vision, double vision, or eye pain.  EARS, NOSE, THROAT: Denies tinnitus, ear pain, hearing loss.  RESPIRATORY: Positive cough, shortness of breath,denies  wheezing  CARDIOVASCULAR: Denies chest pain, palpitations, edema.  GASTROINTESTINAL: Denies nausea, vomiting, diarrhea, abdominal pain.  GENITOURINARY: Denies dysuria, hematuria.  ENDOCRINE: Denies nocturia or thyroid problems. HEMATOLOGIC AND LYMPHATIC: Denies easy bruising or bleeding.  SKIN: Denies rash or lesions.  MUSCULOSKELETAL: Denies pain in neck, back, shoulder, knees, hips, or further arthritic symptoms.  NEUROLOGIC: Denies paralysis, paresthesias.  PSYCHIATRIC: Denies anxiety or depressive symptoms. Otherwise full review of systems performed by me is negative.   MEDICATIONS AT HOME:   Prior to Admission medications   Medication Sig Start Date End Date Taking? Authorizing Provider  acetaminophen (TYLENOL) 325 MG tablet Take 650 mg by mouth every 6 (six) hours as needed for mild pain or headache.   Yes Historical Provider, MD  albuterol-ipratropium (COMBIVENT) 18-103 MCG/ACT inhaler Inhale 2 puffs into the lungs every 4 (four) hours as needed for wheezing or shortness of breath.   Yes Historical Provider, MD  allopurinol (ZYLOPRIM) 100 MG tablet Take 100 mg by mouth every evening.    Yes Historical Provider, MD  amLODipine (NORVASC) 10 MG tablet Take 10 mg by mouth daily. Pt does not take on dialysis days.   Yes Historical Provider, MD  atorvastatin (LIPITOR) 40 MG tablet Take 40 mg by mouth every evening.    Yes Historical Provider, MD  azelastine (ASTELIN) 0.1 % nasal spray Place 2 sprays into both nostrils daily as needed for rhinitis.    Yes Historical  Provider, MD  cinacalcet (SENSIPAR) 30 MG tablet Take 30 mg by mouth every evening.   Yes Historical Provider, MD  cycloSPORINE (RESTASIS) 0.05 % ophthalmic emulsion Place 1 drop into both eyes 2 (two) times daily. 10/03/14  Yes Theodoro Grist, MD  diphenhydrAMINE (BENADRYL) 25 MG tablet Take 25 mg by mouth at bedtime as needed for itching or sleep.   Yes Historical Provider, MD  esomeprazole (NEXIUM) 40 MG capsule Take 1 capsule (40 mg total) by mouth 2 (two) times daily. 09/06/14  Yes Birdie Sons, MD  furosemide (LASIX) 80 MG tablet Take 80 mg by mouth daily. Pt does not take on dialysis days.   Yes Historical Provider, MD  lidocaine-prilocaine (EMLA) cream Apply 1 application topically as needed (before port access).    Yes Historical Provider, MD  loratadine (CLARITIN) 10 MG tablet Take 10 mg by mouth daily as needed for allergies.    Yes Historical Provider, MD  losartan (COZAAR) 25 MG tablet Take 25 mg by mouth every evening.   Yes Historical Provider, MD  meclizine (ANTIVERT) 25 MG tablet Take 25 mg by mouth 2 (two) times daily as needed for dizziness.   Yes Historical Provider, MD  polyvinyl alcohol (LIQUIFILM TEARS) 1.4 % ophthalmic solution Place 1 drop into both eyes as  needed for dry eyes. 10/07/14  Yes Fritzi Mandes, MD  potassium chloride SA (K-DUR,KLOR-CON) 20 MEQ tablet Take 20 mEq by mouth daily.   Yes Historical Provider, MD  rOPINIRole (REQUIP) 2 MG tablet Take 2 mg by mouth at bedtime.   Yes Historical Provider, MD  sevelamer carbonate (RENVELA) 800 MG tablet Take 2 tablets (1,600 mg total) by mouth 3 (three) times daily with meals. 10/03/14  Yes Theodoro Grist, MD  ALPRAZolam Duanne Moron) 0.5 MG tablet Take 1 tablet (0.5 mg total) by mouth at bedtime. 11/12/14   Gladstone Lighter, MD  ferrous sulfate 325 (65 FE) MG tablet Take 1 tablet (325 mg total) by mouth 2 (two) times daily with a meal. 11/12/14   Gladstone Lighter, MD  oxyCODONE (OXY IR/ROXICODONE) 5 MG immediate release tablet Take 1  tablet (5 mg total) by mouth every 4 (four) hours as needed for severe pain. 11/12/14   Gladstone Lighter, MD  traMADol-acetaminophen (ULTRACET) 37.5-325 MG tablet Take 1 tablet by mouth every 6 (six) hours as needed for moderate pain. 11/12/14   Gladstone Lighter, MD      VITAL SIGNS:  Blood pressure 132/44, pulse 76, temperature 98.2 F (36.8 C), temperature source Oral, resp. rate 20, height 5\' 9"  (1.753 m), weight 216 lb 4.3 oz (98.1 kg), SpO2 94 %.  PHYSICAL EXAMINATION:  VITAL SIGNS: Filed Vitals:   11/12/14 1525  BP:   Pulse: 76  Temp:   Resp:    GENERAL:69 y.o.male currently in no acute distress. Chronically ill-appearing, poor historian  HEAD: Normocephalic, atraumatic.  EYES: Pupils equal, round, reactive to light. Extraocular muscles intact. No scleral icterus.  MOUTH: Moist mucosal membrane. Dentition intact. No abscess noted.  EAR, NOSE, THROAT: Clear without exudates. No external lesions.  NECK: Supple. No thyromegaly. No nodules. No JVD.  PULMONARY:  coarse breath sounds without wheeze rails or rhonci. No use of accessory muscles, Good respiratory effort. good air entry bilaterally CHEST: Nontender to palpation.  CARDIOVASCULAR: S1 and S2. Regular rate and rhythm. No murmurs, rubs, or gallops. No edema. Pedal pulses 2+ bilaterally.  GASTROINTESTINAL: Soft, nontender, nondistended. No masses. Positive bowel sounds. No hepatosplenomegaly.  MUSCULOSKELETAL: No swelling, clubbing, or edema. Range of motion full in all extremities.  NEUROLOGIC: Cranial nerves II through XII are intact. No gross focal neurological deficits. Sensation intact. Reflexes intact.  SKIN: No ulceration, lesions, rashes, or cyanosis. Skin warm and dry. Turgor intact.  PSYCHIATRIC: Mood, affect within normal limits. The patient is awake, alert and oriented x 3. Insight, judgment intact.    LABORATORY PANEL:   CBC  Recent Labs Lab 11/12/14 0443  WBC 6.2  HGB 7.5*  HCT 22.4*  PLT 269    ------------------------------------------------------------------------------------------------------------------  Chemistries   Recent Labs Lab 11/12/14 0443  NA 141  K 3.8  CL 104  CO2 27  GLUCOSE 99  BUN 41*  CREATININE 6.49*  CALCIUM 8.5*   ------------------------------------------------------------------------------------------------------------------  Cardiac Enzymes No results for input(s): TROPONINI in the last 168 hours. ------------------------------------------------------------------------------------------------------------------  RADIOLOGY:  No results found.  EKG:   Orders placed or performed during the hospital encounter of 11/09/14  . ED EKG  . ED EKG  . EKG 12-Lead  . EKG 12-Lead    IMPRESSION AND PLAN:   69 year old Caucasian gentleman history of end-stage renal disease on hemodialysis presenting with weakness found to have anemia with fecal occult blood test positive  1. Symptomatic anemia: We'll order for blood transfusion to be performed with hemodialysis as the patient is volume overloaded  at this time. Given positive fecal occult blood test will consult gastroenterology, trend CBC every 6 hours 2. End-stage renal disease on hemodialysis: Missed hemodialysis will consult nephrology for continuation, the patient still makes urine and the somewhat volume overloaded at this time will dose IV Lasix once now and hopefully improve volume status 3. Essential hypertension: Norvasc 4. GERD history of esophagitis: PPI therapy 5. Venous thromboembolism prophylactic: SCDs   All the records are reviewed and case discussed with ED provider. Management plans discussed with the patient, family and they are in agreement.  CODE STATUS: Full  TOTAL TIME TAKING CARE OF THIS PATIENT: 45  minutes.    Larnce Schnackenberg,  Karenann Cai.D on 11/18/2014 at 12:19 AM  Between 7am to 6pm - Pager - [phone]  After 6pm: House Pager: - [phone]  Tyna Jaksch  Hospitalists  Office  [phone]  CC: Primary care physician; Lelon Huh, MD

## 2014-11-10 NOTE — Care Management (Addendum)
Admitted to [facility] with the diagnosis of GI bleed. Daughter, Amy, lives with him. [phone])  Last seen Dr. Caryn Section 1 month ago. Dialysis Monday-Wednesday-Friday on [address] x 1 year. Home health in place, doesn't remember name of agency. Just discharged from [facility] a week ago. Daughter helps with basic and instrumental needs. Uses a wheelchair to get around in the home. Fair-good appetite. [facility] last Sunday, Daughter will transport. Shelbie Ammons RN MSN Care Management [phone]

## 2014-11-11 ENCOUNTER — Encounter: Payer: Self-pay | Admitting: *Deleted

## 2014-11-11 ENCOUNTER — Encounter
Admission: EM | Disposition: A | Discharge status: Skilled Nursing Facility | Payer: Self-pay | Source: Home / Self Care | Attending: Internal Medicine

## 2014-11-11 ENCOUNTER — Inpatient Hospital Stay: Payer: Medicare Other | Admitting: Anesthesiology

## 2014-11-11 HISTORY — PX: COLONOSCOPY WITH PROPOFOL: SHX5780

## 2014-11-11 HISTORY — PX: ESOPHAGOGASTRODUODENOSCOPY (EGD) WITH PROPOFOL: SHX5813

## 2014-11-11 LAB — BASIC METABOLIC PANEL
Anion gap: 9 (ref 5–15)
BUN: 29 mg/dL — AB (ref 6–20)
CHLORIDE: 98 mmol/L — AB (ref 101–111)
CO2: 29 mmol/L (ref 22–32)
CREATININE: 4.93 mg/dL — AB (ref 0.61–1.24)
Calcium: 8.5 mg/dL — ABNORMAL LOW (ref 8.9–10.3)
GFR calc Af Amer: 12 mL/min — ABNORMAL LOW (ref >60)
GFR calc non Af Amer: 11 mL/min — ABNORMAL LOW (ref >60)
GLUCOSE: 97 mg/dL (ref 65–99)
POTASSIUM: 3.7 mmol/L (ref 3.5–5.1)
Sodium: 136 mmol/L (ref 135–145)

## 2014-11-11 LAB — HEPATITIS B SURFACE ANTIGEN: HEP B S AG: NEGATIVE

## 2014-11-11 LAB — CBC
HCT: 23.1 % — ABNORMAL LOW (ref 40.0–52.0)
Hemoglobin: 7.9 g/dL — ABNORMAL LOW (ref 13.0–18.0)
MCH: 32.7 pg (ref 26.0–34.0)
MCHC: 34 g/dL (ref 32.0–36.0)
MCV: 96.2 fL (ref 80.0–100.0)
PLATELETS: 263 10*3/uL (ref 150–440)
RBC: 2.4 MIL/uL — AB (ref 4.40–5.90)
RDW: 15.1 % — ABNORMAL HIGH (ref 11.5–14.5)
WBC: 5.8 10*3/uL (ref 3.8–10.6)

## 2014-11-11 LAB — HEPATITIS B SURFACE ANTIBODY,QUALITATIVE: Hep B S Ab: REACTIVE

## 2014-11-11 SURGERY — COLONOSCOPY WITH PROPOFOL
Anesthesia: General

## 2014-11-11 MED ORDER — MIDAZOLAM HCL 2 MG/2ML IJ SOLN
INTRAMUSCULAR | Status: DC | PRN
  Administered 2014-11-11: 1 mg via INTRAVENOUS

## 2014-11-11 MED ORDER — SODIUM CHLORIDE 0.9 % IV SOLN: INTRAVENOUS | Status: DC

## 2014-11-11 MED ORDER — PROPOFOL 500 MG/50ML IV EMUL
INTRAVENOUS | Status: DC | PRN
  Administered 2014-11-11: 100 ug/kg/min via INTRAVENOUS

## 2014-11-11 MED ORDER — IPRATROPIUM-ALBUTEROL 0.5-2.5 (3) MG/3ML IN SOLN
3.0000 mL | Freq: Once | RESPIRATORY_TRACT | Status: AC
  Administered 2014-11-11: 3 mL via RESPIRATORY_TRACT

## 2014-11-11 MED ORDER — SODIUM CHLORIDE 0.9 % IV SOLN
INTRAVENOUS | Status: DC
  Administered 2014-11-11 (×2): via INTRAVENOUS

## 2014-11-11 MED ORDER — ONDANSETRON HCL 4 MG/2ML IJ SOLN
4.0000 mg | Freq: Four times a day (QID) | INTRAMUSCULAR | Status: DC | PRN
  Administered 2014-11-11: 01:00:00 4 mg via INTRAVENOUS
  Filled 2014-11-11: qty 2

## 2014-11-11 MED ORDER — FENTANYL CITRATE (PF) 100 MCG/2ML IJ SOLN
INTRAMUSCULAR | Status: DC | PRN
  Administered 2014-11-11: 50 ug via INTRAVENOUS

## 2014-11-11 MED ORDER — PHENYLEPHRINE HCL 10 MG/ML IJ SOLN
INTRAMUSCULAR | Status: DC | PRN
  Administered 2014-11-11: 100 ug via INTRAVENOUS
  Administered 2014-11-11: 50 ug via INTRAVENOUS

## 2014-11-11 NOTE — Op Note (Signed)
[facility] Gastroenterology Patient Name: Troy Gallagher Procedure Date: 11/11/2014 9:02 AM MRN: [id number] Account #: [id number] Date of Birth: [birth year] Admit Type: Inpatient Age: 69 Room: [facility] ENDO ROOM 4 Gender: Male Note Status: Finalized Procedure:         Upper GI endoscopy Indications:       Acute post hemorrhagic anemia, Heme positive stool, Hx of                     Barrett's Providers:         Lupita Dawn. Candace Cruise, MD Referring MD:      Kirstie Peri. Caryn Section, MD (Referring MD) Medicines:         Monitored Anesthesia Care Complications:     No immediate complications. Procedure:         Pre-Anesthesia Assessment:                    - Prior to the procedure, a History and Physical was                     performed, and patient medications, allergies and                     sensitivities were reviewed. The patient's tolerance of                     previous anesthesia was reviewed.                    - The risks and benefits of the procedure and the sedation                     options and risks were discussed with the patient. All                     questions were answered and informed consent was obtained.                    - After reviewing the risks and benefits, the patient was                     deemed in satisfactory condition to undergo the procedure.                    After obtaining informed consent, the endoscope was passed                     under direct vision. Throughout the procedure, the                     patient's blood pressure, pulse, and oxygen saturations                     were monitored continuously. The Endoscope was introduced                     through the mouth, and advanced to the second part of                     duodenum. The upper GI endoscopy was accomplished without                     difficulty. The patient tolerated the procedure well. Findings:      There were esophageal mucosal changes consistent with  short-segment   Barrett's esophagus present at the gastroesophageal junction. The       maximum longitudinal extent of these mucosal changes was 2 cm in length.       Mucosa was biopsied with a cold forceps for histology in 4 quadrants at       the gastroesophageal junction. One specimen bottle was sent to pathology.      The exam was otherwise without abnormality.      Localized mild inflammation characterized by erythema was found in the       gastric antrum. Biopsies were taken with a cold forceps for histology.       Possible source of recent bleeding.      The examined duodenum was normal. Impression:        - Esophageal mucosal changes consistent with short-segment                     Barrett's esophagus. Biopsied.                    - The examination was otherwise normal.                    - Acute gastritis. Biopsied.                    - Normal examined duodenum. Recommendation:    - Observe patient's clinical course.                    - Continue present medications.                    - The findings and recommendations were discussed with the                     patient. Procedure Code(s): --- Professional ---                    [phone], Esophagogastroduodenoscopy, flexible, transoral;                     with biopsy, single or multiple Diagnosis Code(s): --- Professional ---                    K22.9, Disease of esophagus, unspecified                    K29.00, Acute gastritis without bleeding                    D62, Acute posthemorrhagic anemia                    R19.5, Other fecal abnormalities CPT copyright 2014 American Medical Association. All rights reserved. The codes documented in this report are preliminary and upon coder review may  be revised to meet current compliance requirements. Hulen Luster, MD 11/11/2014 9:17:09 AM This report has been signed electronically. Number of Addenda: 0 Note Initiated On: 11/11/2014 9:02 AM      [facility]

## 2014-11-11 NOTE — Progress Notes (Signed)
Pt being transferred to room 216 due to be active dialysis pt.  Report called to [place].

## 2014-11-11 NOTE — Plan of Care (Addendum)
Problem: Discharge Progression Outcomes Goal: Other Discharge Outcomes/Goals Outcome: Progressing Plan of care progress to goals: 1. No c/o pain, resting comfortably in bed 2. Hemodynamically:             -VSS, afebrile              -bowel prep 3/4 completed due to N/V. On call GI MD aware. Tap water enemas to be given this morning before EGD/Colonoscopy around 0830AM  -no sign of bleeding noted, Hgb increased to 7.5 3. NPO since midnight for procedure  4. High fall risk. Bed alarm on, hourly rounding. Pt understands how to use call button for assistance.

## 2014-11-11 NOTE — Progress Notes (Signed)
Troy Gallagher at [place] NAME: Troy Gallagher    MR#:  [id number]  DATE OF BIRTH:  [birth year]  SUBJECTIVE:  CHIEF COMPLAINT:   Chief Complaint  Patient presents with  . Weakness   - Hemoglobin is stable, though dropped from baseline. No indication for transfusion yet. -Had EGD colonoscopy today. Complaints of increased bloating. No further melena  REVIEW OF SYSTEMS:  Review of Systems  Constitutional: Negative for fever and chills.  HENT: Negative for ear discharge and nosebleeds.   Eyes: Negative for blurred vision and double vision.  Respiratory: Negative for cough, shortness of breath and wheezing.   Cardiovascular: Negative for chest pain and palpitations.  Gastrointestinal: Positive for abdominal pain. Negative for nausea, vomiting, diarrhea, constipation and melena.  Genitourinary: Negative for dysuria.  Neurological: Positive for tremors, focal weakness and weakness. Negative for dizziness, seizures and headaches.    DRUG ALLERGIES:   Allergies  Allergen Reactions  . Sulfur Hives    VITALS:  Blood pressure 117/40, pulse 70, temperature 98.7 F (37.1 C), temperature source Oral, resp. rate 17, height 5\' 9"  (1.753 m), weight 102.1 kg (225 lb 1.4 oz), SpO2 94 %.  PHYSICAL EXAMINATION:  Physical Exam  GENERAL:  69 y.o.-year-old patient lying in the bed with no acute distress.  EYES: Pupils equal, round, reactive to light and accommodation. No scleral icterus. Extraocular muscles intact. Frequent eye blinking noted which is chronic HEENT: Head atraumatic, normocephalic. Oropharynx and nasopharynx clear.  NECK:  Supple, no jugular venous distention. No thyroid enlargement, no tenderness.  LUNGS: Normal breath sounds bilaterally, no wheezing, rhonchi or crepitation. No use of accessory muscles of respiration. Bibasilar crackles noted. CARDIOVASCULAR: S1, S2 normal. No rubs, or gallops. 3/6 systolic murmur present ABDOMEN: Soft,  nontender, nondistended. Bowel sounds present. No organomegaly or mass.  EXTREMITIES: No pedal edema, cyanosis, or clubbing.  NEUROLOGIC: Cranial nerves II through XII are intact. Muscle strength 5/5 on left side and 4/5 on the right side. Sensation intact. Gait not checked.  PSYCHIATRIC: The patient is alert and oriented x 3.  SKIN: No obvious rash, lesion, or ulcer.    LABORATORY PANEL:   CBC  Recent Labs Lab 11/11/14 0546  WBC 5.8  HGB 7.9*  HCT 23.1*  PLT 263   ------------------------------------------------------------------------------------------------------------------  Chemistries   Recent Labs Lab 11/09/14 2250  11/11/14 0546  NA 140  < > 136  K 5.0  < > 3.7  CL 110  < > 98*  CO2 19*  < > 29  GLUCOSE 94  < > 97  BUN 63*  < > 29*  CREATININE 8.19*  < > 4.93*  CALCIUM 8.8*  < > 8.5*  MG 1.7  --   --   AST 11*  --   --   ALT 8*  --   --   ALKPHOS 72  --   --   BILITOT 1.0  --   --   < > = values in this interval not displayed. ------------------------------------------------------------------------------------------------------------------  Cardiac Enzymes  Recent Labs Lab 11/09/14 2250  TROPONINI 0.05*   ------------------------------------------------------------------------------------------------------------------  RADIOLOGY:  Dg Chest Portable 1 View  11/09/2014  CLINICAL DATA:  Generalize weakness today. Shortness of breath and fever. Dialysis patient. EXAM: PORTABLE CHEST 1 VIEW COMPARISON:  09/28/2014 FINDINGS: Cardiac enlargement with mild vascular congestion. No edema or consolidation in the lungs. No blunting of costophrenic angles. No pneumothorax. Mediastinal contours appear intact. Thoracic scoliosis convex towards the right. Postoperative changes  in the cervical spine and left shoulder. IMPRESSION: Cardiac enlargement with mild pulmonary vascular congestion. No edema or consolidation. Electronically Signed   By: Lucienne Capers M.D.   On:  11/09/2014 22:52    EKG:   Orders placed or performed during the hospital encounter of 11/09/14  . ED EKG  . ED EKG  . EKG 12-Lead  . EKG 12-Lead    ASSESSMENT AND PLAN:   69 year old male with recent stroke about a month ago, status post rehabilitation, end-stage renal disease on Monday Wednesday Friday hemodialysis, hypertension hyperlipidemia, diabetes mellitus proximal from home secondary to weakness, worsening breathing and melena.  #1 Melena-with acute on chronic anemia noted. - hb stable around 7, no indication for transfusion yet -GI consult apprciated. -EGD and colonoscopy in today. Colonic polyps noted and also acute gastritis noted. Likely cause of his melena. -Protonix twice a day for now, hold aspirin for now. -Transfusion if hemoglobin is less than 7 -Avoid NSAIDS  #2 acute pulmonary edema- improved after dialysis -Continue Lasix.  #3 end-stage renal disease on hemodialysis-Monday Wednesday Friday schedule, -Last dialysis yesterday, next dialysis tomorrow per schedule.  #4 hypertension-on Norvasc, losartan. Labetalol is on hold  #5 Recent CVA-with right-sided weakness noted. Just finished rehabilitation. Continue statin -Aspirin on hold due to GI bleed.  - hold off for now due to acute gastritis and anemia -Risk of stroke is there but with the anemia and GI bleed we'll hold off at this time.  #6 fever-on admission had a temperature of 10 5F. -No elevated white count, blood cultures ordered. No further fevers here in the hospital. -UA and chest x-ray normal. We'll hold off on antibiotics at this time. Just monitor fever curve.  #7 DVT prophylaxis - subcutaneous heparin  Physical therapy consult. Patient was recently discharged from [facility] after his stroke. Currently he is at home.   All the records are reviewed and case discussed with Care Management/Social Workerr. Management plans discussed with the patient, family and they are in  agreement.  CODE STATUS: Full code  TOTAL TIME TAKING CARE OF THIS PATIENT: 37 minutes.   POSSIBLE D/C IN 1-2 DAYS, DEPENDING ON CLINICAL CONDITION.   Troy Gallagher M.D on 11/11/2014 at 2:24 PM  Between 7am to 6pm - Pager - [phone]  After 6pm go to [email or web address] - password [email or web address] [place] [facility]  Office  [phone]  CC: Primary care physician; Lelon Huh, MD

## 2014-11-11 NOTE — Anesthesia Postprocedure Evaluation (Signed)
  Anesthesia Post-op Note  Patient: Troy Gallagher  Procedure(s) Performed: Procedure(s): COLONOSCOPY WITH PROPOFOL (N/A) ESOPHAGOGASTRODUODENOSCOPY (EGD) WITH PROPOFOL (N/A)  Anesthesia type:General  Patient location: PACU  Post pain: Pain level controlled  Post assessment: Post-op Vital signs reviewed, Patient's Cardiovascular Status Stable, Respiratory Function Stable, Patent Airway and No signs of Nausea or vomiting  Post vital signs: Reviewed and stable  Last Vitals:  Filed Vitals:   11/11/14 1337  BP: 117/40  Pulse: 70  Temp: 37.1 C  Resp: 17    Level of consciousness: awake, alert  and patient cooperative  Complications: No apparent anesthesia complications

## 2014-11-11 NOTE — Op Note (Signed)
EGD and colonoscopy suggest bleeding from stomach. Acute gastritis seen. Barrett's again seen. Biopsies taken. Poor prep in colon but 2 polyps removed. No blood in colon. Reg diet ordered. Continue daily PPI. Suspect recent bleeding related ASA use. Will sign off. thanks

## 2014-11-11 NOTE — Op Note (Signed)
[facility] Patient Name: Troy Gallagher Procedure Date: 11/11/2014 9:01 AM MRN: [id number] Account #: [id number] Date of Birth: 1945/02/14 Admit Type: Inpatient Age: 69 Room: [facility] ENDO ROOM 4 Gender: Male Note Status: Finalized Procedure:         Colonoscopy Indications:       Heme positive stool, Acute post hemorrhagic anemia,                     Personal history of colonic polyps Providers:         Lupita Dawn. Candace Cruise, MD Referring MD:      Kirstie Peri. Caryn Section, MD (Referring MD) Medicines:         Monitored Anesthesia Care Complications:     No immediate complications. Procedure:         Pre-Anesthesia Assessment:                    - Prior to the procedure, a History and Physical was                     performed, and patient medications, allergies and                     sensitivities were reviewed. The patient's tolerance of                     previous anesthesia was reviewed.                    - The risks and benefits of the procedure and the sedation                     options and risks were discussed with the patient. All                     questions were answered and informed consent was obtained.                    - After reviewing the risks and benefits, the patient was                     deemed in satisfactory condition to undergo the procedure.                    After obtaining informed consent, the colonoscope was                     passed under direct vision. Throughout the procedure, the                     patient's blood pressure, pulse, and oxygen saturations                     were monitored continuously. The Colonoscope was                     introduced through the anus and advanced to the the cecum,                     identified by appendiceal orifice and ileocecal valve. The                     colonoscopy was performed without difficulty. The patient  tolerated the procedure well. The quality of the bowel                     preparation was poor. Findings:      A medium polyp was found in the ascending colon. The polyp was sessile.       The polyp was removed with a hot snare. Resection and retrieval were       complete.      A small polyp was found in the descending colon. The polyp was sessile.       The polyp was removed with a hot snare. Resection and retrieval were       complete. No blood anywhere in the colon. Prep poor. Colon image       suboptimal.      The exam was otherwise without abnormality. Impression:        - Preparation of the colon was poor.                    - One medium polyp in the ascending colon. Resected and                     retrieved.                    - One small polyp in the descending colon. Resected and                     retrieved.                    - The examination was otherwise normal. Recommendation:    - Repeat colonoscopy in 1 year for surveillance.                    - The findings and recommendations were discussed with the                     patient. Procedure Code(s): --- Professional ---                    [phone], Colonoscopy, flexible; with removal of tumor(s),                     polyp(s), or other lesion(s) by snare technique Diagnosis Code(s): --- Professional ---                    D12.2, Benign neoplasm of ascending colon                    D12.4, Benign neoplasm of descending colon                    R19.5, Other fecal abnormalities                    D62, Acute posthemorrhagic anemia                    Z86.010, Personal history of colonic polyps CPT copyright 2014 American Medical Association. All rights reserved. The codes documented in this report are preliminary and upon coder review may  be revised to meet current compliance requirements. Hulen Luster, MD 11/11/2014 9:43:08 AM This report has been signed electronically. Number of Addenda: 0 Note Initiated On: 11/11/2014 9:01 AM Scope Withdrawal Time: 0 hours 9 minutes 31  seconds  Total Procedure Duration: 0  hours 21 minutes 31 seconds       [facility]

## 2014-11-11 NOTE — Anesthesia Preprocedure Evaluation (Addendum)
Anesthesia Evaluation  Patient identified by MRN, date of birth, ID band Patient awake    Reviewed: Allergy & Precautions, H&P , NPO status , Patient's Chart, lab work & pertinent test results  History of Anesthesia Complications Negative for: history of anesthetic complications  Airway Mallampati: III  TM Distance: >3 FB Neck ROM: limited    Dental  (+) Poor Dentition, Missing, Upper Dentures   Pulmonary neg shortness of breath, COPD, former smoker,    Pulmonary exam normal breath sounds clear to auscultation       Cardiovascular Exercise Tolerance: Poor hypertension, + Peripheral Vascular Disease  Normal cardiovascular exam Rhythm:regular Rate:Normal     Neuro/Psych  Headaches, Seizures -,  PSYCHIATRIC DISORDERS Anxiety Depression TIACVA (weakness in legs), Residual Symptoms    GI/Hepatic negative GI ROS, Neg liver ROS, GERD  Controlled,  Endo/Other  diabetes  Renal/GU Renal disease  negative genitourinary   Musculoskeletal   Abdominal   Peds  Hematology  (+) Blood dyscrasia, anemia ,   Anesthesia Other Findings Past Medical History:   Complication of anesthesia                                   Hypertension                                                 Stroke ([place]) within the past year                                               GERD (gastroesophageal reflux disease)                       Headache                                                     Kidney dialysis status                          [id number]     HLD (hyperlipidemia)                                         Diabetes mellitus, type II (HCC)                             Seizure (HCC)                                                BPH (benign prostatic hyperplasia)                           COPD (chronic obstructive pulmonary disease) [place] Kitchen  PVD (peripheral vascular disease) ([place])                      Carotid stenosis                                              Adenoma of large intestine                                   ESRD on hemodialysis (HCC)                                     Comment:MWF   Anxiety                                                      RLS (restless legs syndrome)                                BMI    Body Mass Index   33.22 kg/m 2      Reproductive/Obstetrics negative OB ROS                            Anesthesia Physical Anesthesia Plan  ASA: III  Anesthesia Plan: General   Post-op Pain Management:    Induction:   Airway Management Planned:   Additional Equipment:   Intra-op Plan:   Post-operative Plan:   Informed Consent: I have reviewed the patients History and Physical, chart, labs and discussed the procedure including the risks, benefits and alternatives for the proposed anesthesia with the patient or authorized representative who has indicated his/her understanding and acceptance.   Dental Advisory Given  Plan Discussed with: Anesthesiologist, CRNA and Surgeon  Anesthesia Plan Comments: (Patient informed that they are higher risk for complications from anesthesia during this procedure due to their medical history concerning stroke.  Patient voiced understanding. )       Anesthesia Quick Evaluation

## 2014-11-11 NOTE — Transfer of Care (Signed)
Immediate Anesthesia Transfer of Care Note  Patient: Troy Gallagher  Procedure(s) Performed: Procedure(s): COLONOSCOPY WITH PROPOFOL (N/A) ESOPHAGOGASTRODUODENOSCOPY (EGD) WITH PROPOFOL (N/A)  Patient Location: PACU and Endoscopy Unit  Anesthesia Type:General  Level of Consciousness: patient cooperative and lethargic  Airway & Oxygen Therapy: Patient Spontanous Breathing and Patient connected to nasal cannula oxygen  Post-op Assessment: Report given to RN and Post -op Vital signs reviewed and stable  Post vital signs: Reviewed and stable  Last Vitals:  Filed Vitals:   11/11/14 0948  BP: 114/58  Pulse: 72  Temp: 36.7 C  Resp: 21    Complications: No apparent anesthesia complications

## 2014-11-11 NOTE — Progress Notes (Signed)
Tap water enema x2 completed. Output light brown & watery. Tolerated well.

## 2014-11-11 NOTE — Progress Notes (Signed)
Dr. Jannifer Franklin notified pt throwing up r/t attempting to complete bowel prep for EGD/Colonoscopy today. MD to place order for Zofran & stated to call on call GI doctor for further directions regarding unsuccessful completion oral bowl prep.   Dr. Candace Cruise notified pt has only drank 3/4 of liquid for bowl prep, dark brown loose bowel movements, & currently vomiting. Agrees with giving Zofran for N/V PRN. Telephone order read back & verified for 2 tap water enemas (first at 0600AM, second at 0630AM).   Will continue to assess.

## 2014-11-12 ENCOUNTER — Telehealth: Payer: Self-pay | Admitting: Family Medicine

## 2014-11-12 LAB — BASIC METABOLIC PANEL
ANION GAP: 10 (ref 5–15)
BUN: 41 mg/dL — AB (ref 6–20)
CHLORIDE: 104 mmol/L (ref 101–111)
CO2: 27 mmol/L (ref 22–32)
Calcium: 8.5 mg/dL — ABNORMAL LOW (ref 8.9–10.3)
Creatinine, Ser: 6.49 mg/dL — ABNORMAL HIGH (ref 0.61–1.24)
GFR calc Af Amer: 9 mL/min — ABNORMAL LOW (ref >60)
GFR calc non Af Amer: 8 mL/min — ABNORMAL LOW (ref >60)
GLUCOSE: 99 mg/dL (ref 65–99)
Potassium: 3.8 mmol/L (ref 3.5–5.1)
Sodium: 141 mmol/L (ref 135–145)

## 2014-11-12 LAB — CBC
HEMATOCRIT: 22.4 % — AB (ref 40.0–52.0)
HEMOGLOBIN: 7.5 g/dL — AB (ref 13.0–18.0)
MCH: 32.1 pg (ref 26.0–34.0)
MCHC: 33.7 g/dL (ref 32.0–36.0)
MCV: 95.4 fL (ref 80.0–100.0)
Platelets: 269 10*3/uL (ref 150–440)
RBC: 2.35 MIL/uL — ABNORMAL LOW (ref 4.40–5.90)
RDW: 14.9 % — AB (ref 11.5–14.5)
WBC: 6.2 10*3/uL (ref 3.8–10.6)

## 2014-11-12 LAB — SURGICAL PATHOLOGY

## 2014-11-12 MED ORDER — EPOETIN ALFA 10000 UNIT/ML IJ SOLN
10000.0000 [IU] | INTRAMUSCULAR | Status: DC
  Administered 2014-11-12: 10000 [IU] via INTRAVENOUS

## 2014-11-12 MED ORDER — FERROUS SULFATE 325 (65 FE) MG PO TABS: 325.0000 mg | ORAL_TABLET | Freq: Two times a day (BID) | ORAL | Status: AC

## 2014-11-12 MED ORDER — ALPRAZOLAM 0.5 MG PO TABS: 0.5000 mg | ORAL_TABLET | Freq: Every day | ORAL | Status: DC

## 2014-11-12 MED ORDER — FERROUS SULFATE 325 (65 FE) MG PO TABS
325.0000 mg | ORAL_TABLET | Freq: Two times a day (BID) | ORAL | Status: DC
  Administered 2014-11-12 (×2): 325 mg via ORAL
  Filled 2014-11-12 (×2): qty 1

## 2014-11-12 MED ORDER — TRAMADOL-ACETAMINOPHEN 37.5-325 MG PO TABS: 1.0000 | ORAL_TABLET | Freq: Four times a day (QID) | ORAL | Status: DC | PRN

## 2014-11-12 MED ORDER — OXYCODONE HCL 5 MG PO TABS: 5.0000 mg | ORAL_TABLET | ORAL | Status: DC | PRN

## 2014-11-12 NOTE — Clinical Social Work Placement (Signed)
   CLINICAL SOCIAL WORK PLACEMENT  NOTE  Date:  11/12/2014  Patient Details  Name: Troy Gallagher MRN: [id number] Date of Birth: 1945-11-15  Clinical Social Work is seeking post-discharge placement for this patient at the [place] level of care (*CSW will initial, date and re-position this form in  chart as items are completed):  Yes   Patient/family provided with [place] Work Department's list of facilities offering this level of care within the geographic area requested by the patient (or if unable, by the patient's family).  Yes   Patient/family informed of their freedom to choose among providers that offer the needed level of care, that participate in Medicare, Medicaid or managed care program needed by the patient, have an available bed and are willing to accept the patient.  Yes   Patient/family informed of [place]'s ownership interest in [facility] and [facility], as well as of the fact that they are under no obligation to receive care at these facilities.  PASRR submitted to EDS on       PASRR number received on       Existing PASRR number confirmed on 11/12/14     FL2 transmitted to all facilities in geographic area requested by pt/family on 11/12/14     FL2 transmitted to all facilities within larger geographic area on       Patient informed that his/her managed care company has contracts with or will negotiate with certain facilities, including the following:        No   Patient/family informed of bed offers received.  Patient chooses bed at  [facility])     Physician recommends and patient chooses bed at  [facility])    Patient to be transferred to  [facility]) on 11/12/14.  Patient to be transferred to facility by  (EMS)     Patient family notified on 11/12/14 of transfer.  Name of family member notified:   (both daughters)     PHYSICIAN       Additional Comment:    _______________________________________________ Shela Leff, LCSW 11/12/2014, 5:01 PM

## 2014-11-12 NOTE — Progress Notes (Signed)
CCU reported PVCs with trigeminy while pt was in hemodialysis. Dr Tressia Miners notified. Dr acknowledged, no new orders given. Will continue to monitor.

## 2014-11-12 NOTE — Progress Notes (Signed)
Pt left unit via EMS

## 2014-11-12 NOTE — Telephone Encounter (Signed)
FYI

## 2014-11-12 NOTE — Clinical Social Work Note (Signed)
Clinical Social Work Assessment  Patient Details  Name: Troy Gallagher MRN: [id number] Date of Birth: 06/14/1945  Date of referral:  11/12/14               Reason for consult:  Facility Placement                Permission sought to share information with:  Family Supports, Chartered certified accountant granted to share information::  Yes, Verbal Permission Granted  Name::        Agency::     Relationship::     Contact Information:     Housing/Transportation Living arrangements for the past 2 months:    Source of Information:  Patient, Adult Children Patient Interpreter Needed:  None Criminal Activity/Legal Involvement Pertinent to Current Situation/Hospitalization:  No - Comment as needed Significant Relationships:  Adult Children Lives with:  Adult Children Do you feel safe going back to the place where you live?  Yes Need for family participation in patient care:  Yes (Comment)  Care giving concerns:  Patient lives with daughter   Social Worker assessment / plan:  MD has discharged patient today. PT assessed patient late this afternoon and have recommended STR. Patient originally was not agreeable but after he spoke with both of his daughters, he is agreeable. Bed offers extended. Patient's family requested a private room. After much discussion and going back and forth by the daughters, they decided to proceed with STR. Initially the daughters were saying that the patient did not progress at his previous STR stay and that he couldn't stand when he returned home. CSW informed patient and daughters that if he had recently stayed approx a month at his previous rehab facility then he would be in his medicare copay days and would be responsible for 20%. Daughters and patient verbalized understanding. Daughters chose [facility] and discharge summary sent and nurse to call report and patient to transport via EMS.  Employment status:  Retired Forensic scientist:   Medicare PT Recommendations:  [place] / Referral to community resources:  [place]  Patient/Family's Response to care:  Patient reluctant to go but is agreeable.  Patient/Family's Understanding of and Emotional Response to Diagnosis, Current Treatment, and Prognosis:  Patient is irritated at going to STR but is aware his daughters cannot lift him or care for him in his current condition. Emotional Assessment Appearance:  Appears stated age Attitude/Demeanor/Rapport:   (irritated) Affect (typically observed):  Agitated Orientation:  Oriented to Self, Oriented to Place, Oriented to  Time, Oriented to Situation Alcohol / Substance use:  Not Applicable Psych involvement (Current and /or in the community):  No (Comment)  Discharge Needs  Concerns to be addressed:  Care Coordination Readmission within the last 30 days:  No Current discharge risk:  None Barriers to Discharge:  No Barriers Identified   Shela Leff, LCSW 11/12/2014, 4:53 PM

## 2014-11-12 NOTE — Care Management (Signed)
Confirmed with Malachy Mood from [place] that patient is open to their home health services.  Patient has PT, OT and Nursing.

## 2014-11-12 NOTE — Care Management Important Message (Signed)
Important Message  Patient Details  Name: Troy Gallagher MRN: [id number] Date of Birth: [birth year]   Medicare Important Message Given:  Yes-third notification given    Juliann Pulse A Allmond 11/12/2014, 1:44 PM

## 2014-11-12 NOTE — Progress Notes (Signed)
Initial Nutrition Assessment     INTERVENTION:  Meals and snacks: Cater to pt preferences   NUTRITION DIAGNOSIS:    (None at this time) related to   as evidenced by  .    GOAL:   Patient will meet greater than or equal to 90% of their needs    MONITOR:    (Energy intake, Electrolyte and renal profile)  REASON FOR ASSESSMENT:   Other (Comment) (renal diet)    ASSESSMENT:      Pt admitted with weakness, s/p EGD and colonscopy with colonic polyps and acute gastritis  Past Medical History  Diagnosis Date  . Complication of anesthesia   . Hypertension   . Stroke ([facility])   . GERD (gastroesophageal reflux disease)   . Headache   . Kidney dialysis status [id number]  . HLD (hyperlipidemia)   . Diabetes mellitus, type II ([place])   . Seizure ([place])   . BPH (benign prostatic hyperplasia)   . COPD (chronic obstructive pulmonary disease) ([place])   . PVD (peripheral vascular disease) ([place])   . Carotid stenosis   . Adenoma of large intestine   . ESRD on hemodialysis ([place])     MWF  . Anxiety   . RLS (restless legs syndrome)     Current Nutrition: eating 100% of meals  Food/Nutrition-Related History: Pt reports good appetite prior to admission   Medications: sensipar, fe sulfate, lasix, renvela  Electrolyte/Renal Profile and Glucose Profile:   Recent Labs Lab 11/09/14 2250 11/10/14 0512 11/10/14 1230 11/11/14 0546 11/12/14 0443  NA 140 140  --  136 141  K 5.0 4.6  --  3.7 3.8  CL 110 109  --  98* 104  CO2 19* 19*  --  29 27  BUN 63* 64*  --  29* 41*  CREATININE 8.19* 8.43*  --  4.93* 6.49*  CALCIUM 8.8* 8.8*  --  8.5* 8.5*  MG 1.7  --   --   --   --   PHOS  --   --  4.6  --   --   GLUCOSE 94 89  --  97 99   Protein Profile:  Recent Labs Lab 11/09/14 2250  ALBUMIN 3.5    Gastrointestinal Profile: Last BM:10/13    Weight Change: stable wt per pt    Diet Order:  Diet renal with fluid restriction Fluid restriction:: 1200 mL Fluid; Room service  appropriate?: Yes; Fluid consistency:: Thin Diet - low sodium heart healthy  Skin:   reviewed   Height:   Ht Readings from Last 1 Encounters:  11/10/14 5\' 9"  (1.753 m)    Weight:   Wt Readings from Last 1 Encounters:  11/12/14 216 lb 4.3 oz (98.1 kg)        BMI:  Body mass index is 31.92 kg/(m^2).  EDUCATION NEEDS:   No education needs identified at this time  LOW Care Level  [place] Sherburn B. Zenia Resides, [place], [place] (pager)

## 2014-11-12 NOTE — Progress Notes (Signed)
[facility]  ROUNDING NOTE   Subjective:  Patient seen at bedside. Doing better. Due for HD today.   Objective:  Vital signs in last 24 hours:  Temp:  [97.7 F (36.5 C)-98.8 F (37.1 C)] 98.6 F (37 C) (10/14 1030) Pulse Rate:  [62-79] 70 (10/14 1100) Resp:  [16-20] 18 (10/14 1100) BP: (117-164)/(40-58) 161/47 mmHg (10/14 1100) SpO2:  [94 %-99 %] 99 % (10/14 1030) Weight:  [101.1 kg (222 lb 14.2 oz)-101.651 kg (224 lb 1.6 oz)] 101.1 kg (222 lb 14.2 oz) (10/14 1030)  Weight change: -2.349 kg (-5 lb 2.9 oz) Filed Weights   11/10/14 1604 11/12/14 0423 11/12/14 1030  Weight: 102.1 kg (225 lb 1.4 oz) 101.651 kg (224 lb 1.6 oz) 101.1 kg (222 lb 14.2 oz)    Intake/Output: I/O last 3 completed shifts: In: 37 [P.O.:480; I.V.:75] Out: -    Intake/Output this shift:     Physical Exam: General: NAD  Head: Normocephalic, atraumatic. Moist oral mucosal membranes  Eyes: Anicteric  Neck: Supple, trachea midline  Lungs:  Clear to auscultation normal effort  Heart: Regular rate and rhythm no rubs  Abdomen:  Soft, nontender, BS present  Extremities:  1+ peripheral edema.  Neurologic: Nonfocal, moving all four extremities  Skin: No lesions  Access: LUE AVF    Basic Metabolic Panel:  Recent Labs Lab 11/09/14 2250 11/10/14 0512 11/10/14 1230 11/11/14 0546 11/12/14 0443  NA 140 140  --  136 141  K 5.0 4.6  --  3.7 3.8  CL 110 109  --  98* 104  CO2 19* 19*  --  29 27  GLUCOSE 94 89  --  97 99  BUN 63* 64*  --  29* 41*  CREATININE 8.19* 8.43*  --  4.93* 6.49*  CALCIUM 8.8* 8.8*  --  8.5* 8.5*  MG 1.7  --   --   --   --   PHOS  --   --  4.6  --   --     Liver Function Tests:  Recent Labs Lab 11/09/14 2250  AST 11*  ALT 8*  ALKPHOS 72  BILITOT 1.0  PROT 7.3  ALBUMIN 3.5   No results for input(s): LIPASE, AMYLASE in the last 168 hours. No results for input(s): AMMONIA in the last 168 hours.  CBC:  Recent Labs Lab 11/09/14 2250 11/10/14 0512  11/10/14 1033 11/10/14 2128 11/11/14 0546 11/12/14 0443  WBC 10.0 7.0 6.9  --  5.8 6.2  NEUTROABS 8.0*  --   --   --   --   --   HGB 7.7* 7.5* 7.1* 7.5* 7.9* 7.5*  HCT 23.2* 22.1* 21.9*  --  23.1* 22.4*  MCV 97.0 97.8 97.3  --  96.2 95.4  PLT 263 228 227  --  263 269    Cardiac Enzymes:  Recent Labs Lab 11/09/14 2250  TROPONINI 0.05*    BNP: Invalid input(s): POCBNP  CBG: No results for input(s): GLUCAP in the last 168 hours.  Microbiology: Results for orders placed or performed during the hospital encounter of 11/09/14  Culture, blood (routine x 2)     Status: None (Preliminary result)   Collection Time: 11/10/14  9:10 PM  Result Value Ref Range Status   Specimen Description BLOOD RIGHT ARM  Final   Special Requests BOTTLES DRAWN AEROBIC AND ANAEROBIC 9ML  Final   Culture NO GROWTH 2 DAYS  Final   Report Status PENDING  Incomplete  Culture, blood (routine x  2)     Status: None (Preliminary result)   Collection Time: 11/10/14  9:21 PM  Result Value Ref Range Status   Specimen Description BLOOD RIGHT ARM  Final   Special Requests BOTTLES DRAWN AEROBIC AND ANAEROBIC 7ML  Final   Culture NO GROWTH 2 DAYS  Final   Report Status PENDING  Incomplete    Coagulation Studies:  Recent Labs  11/09/14 2250  LABPROT 16.0*  INR 1.26    Urinalysis:  Recent Labs  11/09/14 2257  COLORURINE YELLOW*  LABSPEC 1.013  PHURINE 8.0  GLUCOSEU 150*  HGBUR NEGATIVE  BILIRUBINUR NEGATIVE  KETONESUR TRACE*  PROTEINUR 100*  NITRITE NEGATIVE  LEUKOCYTESUR NEGATIVE      Imaging: No results found.   Medications:     . allopurinol  100 mg Oral QPM  . amLODipine  10 mg Oral Once per day on Sun Mon Wed Fri  . atorvastatin  40 mg Oral QPM  . cinacalcet  30 mg Oral QPM  . cycloSPORINE  1 drop Both Eyes BID  . ferrous sulfate  325 mg Oral BID WC  . furosemide  80 mg Oral Once per day on Sun Mon Wed Fri  . losartan  25 mg Oral QPM  . pantoprazole  40 mg Oral QAC  breakfast  . rOPINIRole  2 mg Oral QHS  . sevelamer carbonate  1,600 mg Oral TID WC  . sodium chloride  3 mL Intravenous Q12H   sodium chloride, sodium chloride, acetaminophen **OR** acetaminophen, alteplase, azelastine, diphenhydrAMINE, heparin, hydrALAZINE, ipratropium-albuterol, ipratropium-albuterol, lidocaine (PF), lidocaine-prilocaine, loratadine, ondansetron (ZOFRAN) IV, oxyCODONE, pentafluoroprop-tetrafluoroeth, polyvinyl alcohol  Assessment/ Plan:  69 y.o. male with end-stage renal disease on hemodialysis Monday, Wednesday, Friday, hypertension, COPD, GERD, gout, left upper extremity AV fistula, restless leg syndrome, hyperlipidemia who was admitted for generalized weakness.  1. End-stage renal disease on hemodialysis Monday, Wednesday, Friday. Pt is due for HD today, orders prepared.    2. Anemia chronic kidney disease/anemia blood loss.hgb 7.5 at the moment, start epogen 10000 units IV with HD. EGD showed gastritis.  3. Secondary hyperparathyroidism.   Last phos 4.6 and acceptable, PTH 449 at last check, continue sensipar and renvela.  4. Hypertension: BP 161/47, continue amlodipine, losartan for now.     LOS: 2 Elroy Schembri 10/14/201611:17 AM

## 2014-11-12 NOTE — Progress Notes (Signed)
Pt d/c to [facility]; report called to [facility], LPN; EMS notified for transport

## 2014-11-12 NOTE — Progress Notes (Signed)
PT Cancellation Note  Patient Details Name: Troy Gallagher MRN: [id number] DOB: October 25, 1945   Cancelled Treatment:    Reason Eval/Treat Not Completed: Patient at procedure or test/unavailable (Pt off floor at dialysis.)  Will re-attempt PT eval at a later date/time as medically appropriate and as able.   Raquel Sarna Aldair Rickel 11/12/2014, 10:26 AM Leitha Bleak, [place]

## 2014-11-12 NOTE — Discharge Summary (Addendum)
[place] [facility] [place] NAME: Troy Gallagher    MR#:  [id number]  DATE OF BIRTH:  1945/12/17  DATE OF ADMISSION:  11/09/2014 ADMITTING PHYSICIAN: Lytle Butte, MD  DATE OF DISCHARGE: 11/12/2014  PRIMARY CARE PHYSICIAN: Lelon Huh, MD    ADMISSION DIAGNOSIS:  Shortness of breath [R06.02] Generalized weakness [R53.1] Symptomatic anemia [D64.9] Gastrointestinal hemorrhage, unspecified gastritis, unspecified gastrointestinal hemorrhage type [K92.2]  DISCHARGE DIAGNOSIS:  Active Problems:   GIB (gastrointestinal bleeding)   SECONDARY DIAGNOSIS:   Past Medical History  Diagnosis Date  . Complication of anesthesia   . Hypertension   . Stroke ([place])   . GERD (gastroesophageal reflux disease)   . Headache   . Kidney dialysis status [id number]  . HLD (hyperlipidemia)   . Diabetes mellitus, type II ([place])   . Seizure ([place])   . BPH (benign prostatic hyperplasia)   . COPD (chronic obstructive pulmonary disease) ([place])   . PVD (peripheral vascular disease) ([place])   . Carotid stenosis   . Adenoma of large intestine   . ESRD on hemodialysis ([place])     MWF  . Anxiety   . RLS (restless legs syndrome)     HOSPITAL COURSE:   69 year old male with recent stroke about a month ago, status post rehabilitation, end-stage renal disease on Monday Wednesday Friday hemodialysis, hypertension hyperlipidemia, diabetes mellitus proximal from home secondary to weakness, worsening breathing and melena.  #1 Melena-with acute on chronic anemia noted. - hb stable around 7, no indication for transfusion yet -GI consult apprciated. -EGD and colonoscopy showing Colonic polyps noted and also acute gastritis noted. Likely cause of his melena. -Protonix twice a day for now, hold aspirin for now. -Transfusion if hemoglobin is less than 7 -Avoid NSAIDS  #2 acute pulmonary edema- improved after dialysis -Continue Lasix. Also on potassium supplements  #3  end-stage renal disease on hemodialysis-Monday Wednesday Friday schedule, Had dialysis today per schedule   #4 hypertension-on Norvasc, losartan. Labetalol is on hold. Blood pressure is low normal  #5 Recent CVA-with right-sided weakness noted. Just finished rehabilitation. Continue statin -Aspirin on hold due to GI bleed.  - hold off for now due to acute gastritis and anemia. Restart after 2 weeks at lower dose -Risk of stroke is there but with the anemia and GI bleed we'll hold off at this time. Discussed with patient and he is agreeable with the plan  #6 fever-on admission had a temperature of 101F.Likely viral. -No elevated white count, blood cultures negative so far. No further fevers here in the hospital. -UA and chest x-ray normal.Not on any antibiotics   Physical therapy consult - recommended rehab as patient is very weak- but patient wants to go home. Has a wheelchair at home..  Patient was recently discharged from [facility] after his stroke.   Discharge today  DISCHARGE CONDITIONS:   Guarded  CONSULTS OBTAINED:  Treatment Team:  Lytle Butte, MD Anthonette Legato, MD GI consult by Dr.Oh  DRUG ALLERGIES:   Allergies  Allergen Reactions  . Sulfur Hives    DISCHARGE MEDICATIONS:   Current Discharge Medication List    START taking these medications   Details  ferrous sulfate 325 (65 FE) MG tablet Take 1 tablet (325 mg total) by mouth 2 (two) times daily with a meal. Qty: 60 tablet, Refills: 3    oxyCODONE (OXY IR/ROXICODONE) 5 MG immediate release tablet Take 1 tablet (5 mg total) by mouth every 4 (four) hours as needed  for severe pain. Qty: 20 tablet, Refills: 0      CONTINUE these medications which have CHANGED   Details  ALPRAZolam (XANAX) 0.5 MG tablet Take 1 tablet (0.5 mg total) by mouth at bedtime. Qty: 180 tablet, Refills: 1    traMADol-acetaminophen (ULTRACET) 37.5-325 MG tablet Take 1 tablet by mouth every 6 (six) hours as needed for  moderate pain. Qty: 40 tablet, Refills: 0      CONTINUE these medications which have NOT CHANGED   Details  acetaminophen (TYLENOL) 325 MG tablet Take 650 mg by mouth every 6 (six) hours as needed for mild pain or headache.    albuterol-ipratropium (COMBIVENT) 18-103 MCG/ACT inhaler Inhale 2 puffs into the lungs every 4 (four) hours as needed for wheezing or shortness of breath.    allopurinol (ZYLOPRIM) 100 MG tablet Take 100 mg by mouth every evening.     amLODipine (NORVASC) 10 MG tablet Take 10 mg by mouth daily. Pt does not take on dialysis days.    atorvastatin (LIPITOR) 40 MG tablet Take 40 mg by mouth every evening.     azelastine (ASTELIN) 0.1 % nasal spray Place 2 sprays into both nostrils daily as needed for rhinitis.     cinacalcet (SENSIPAR) 30 MG tablet Take 30 mg by mouth every evening.    cycloSPORINE (RESTASIS) 0.05 % ophthalmic emulsion Place 1 drop into both eyes 2 (two) times daily. Qty: 0.4 mL, Refills: 3    diphenhydrAMINE (BENADRYL) 25 MG tablet Take 25 mg by mouth at bedtime as needed for itching or sleep.    esomeprazole (NEXIUM) 40 MG capsule Take 1 capsule (40 mg total) by mouth 2 (two) times daily. Qty: 60 capsule, Refills: 6    furosemide (LASIX) 80 MG tablet Take 80 mg by mouth daily. Pt does not take on dialysis days.    lidocaine-prilocaine (EMLA) cream Apply 1 application topically as needed (before port access).     loratadine (CLARITIN) 10 MG tablet Take 10 mg by mouth daily as needed for allergies.     losartan (COZAAR) 25 MG tablet Take 25 mg by mouth every evening.    meclizine (ANTIVERT) 25 MG tablet Take 25 mg by mouth 2 (two) times daily as needed for dizziness.    polyvinyl alcohol (LIQUIFILM TEARS) 1.4 % ophthalmic solution Place 1 drop into both eyes as needed for dry eyes. Qty: 15 mL, Refills: 0    potassium chloride SA (K-DUR,KLOR-CON) 20 MEQ tablet Take 20 mEq by mouth daily.    rOPINIRole (REQUIP) 2 MG tablet Take 2 mg by  mouth at bedtime.    sevelamer carbonate (RENVELA) 800 MG tablet Take 2 tablets (1,600 mg total) by mouth 3 (three) times daily with meals. Qty: 90 tablet, Refills: 6      STOP taking these medications     aspirin 325 MG tablet      indomethacin (INDOCIN) 50 MG capsule      labetalol (NORMODYNE) 100 MG tablet          DISCHARGE INSTRUCTIONS:   1. PCP f/u in 1-2 weeks 2. F/u CBC with PCP 3. Nephrology f/u for dialysis on 11/15/14  If you experience worsening of your admission symptoms, develop shortness of breath, life threatening emergency, suicidal or homicidal thoughts you must seek medical attention immediately by calling 911 or calling your MD immediately  if symptoms less severe.  You Must read complete instructions/literature along with all the possible adverse reactions/side effects for all the Medicines you take and  that have been prescribed to you. Take any new Medicines after you have completely understood and accept all the possible adverse reactions/side effects.   Please note  You were cared for by a hospitalist during your hospital stay. If you have any questions about your discharge medications or the care you received while you were in the hospital after you are discharged, you can call the unit and asked to speak with the hospitalist on call if the hospitalist that took care of you is not available. Once you are discharged, your primary care physician will handle any further medical issues. Please note that NO REFILLS for any discharge medications will be authorized once you are discharged, as it is imperative that you return to your primary care physician (or establish a relationship with a primary care physician if you do not have one) for your aftercare needs so that they can reassess your need for medications and monitor your lab values.    Today   CHIEF COMPLAINT:   Chief Complaint  Patient presents with  . Weakness    VITAL SIGNS:  Blood pressure  132/44, pulse 71, temperature 98.2 F (36.8 C), temperature source Oral, resp. rate 20, height 5\' 9"  (1.753 m), weight 98.1 kg (216 lb 4.3 oz), SpO2 96 %.  I/O:   Intake/Output Summary (Last 24 hours) at 11/12/14 1526 Last data filed at 11/12/14 1357  Gross per 24 hour  Intake    240 ml  Output   1944 ml  Net  -1704 ml    PHYSICAL EXAMINATION:   Physical Exam  GENERAL: 69 y.o.-year-old patient lying in the bed with no acute distress.  EYES: Pupils equal, round, reactive to light and accommodation. No scleral icterus. Extraocular muscles intact. Frequent eye blinking noted which is chronic HEENT: Head atraumatic, normocephalic. Oropharynx and nasopharynx clear.  NECK: Supple, no jugular venous distention. No thyroid enlargement, no tenderness.  LUNGS: Normal breath sounds bilaterally, no wheezing, rhonchi or crepitation. No use of accessory muscles of respiration. Bibasilar crackles noted. CARDIOVASCULAR: S1, S2 normal. No rubs, or gallops. 3/6 systolic murmur present ABDOMEN: Soft, nontender, nondistended. Bowel sounds present. No organomegaly or mass.  EXTREMITIES: No pedal edema, cyanosis, or clubbing.  NEUROLOGIC: Cranial nerves II through XII are intact. Muscle strength 5/5 on left side and 4/5 on the right side. Sensation intact. Gait not checked.  PSYCHIATRIC: The patient is alert and oriented x 3.  SKIN: No obvious rash, lesion, or ulcer.   DATA REVIEW:   CBC  Recent Labs Lab 11/12/14 0443  WBC 6.2  HGB 7.5*  HCT 22.4*  PLT 269    Chemistries   Recent Labs Lab 11/09/14 2250  11/12/14 0443  NA 140  < > 141  K 5.0  < > 3.8  CL 110  < > 104  CO2 19*  < > 27  GLUCOSE 94  < > 99  BUN 63*  < > 41*  CREATININE 8.19*  < > 6.49*  CALCIUM 8.8*  < > 8.5*  MG 1.7  --   --   AST 11*  --   --   ALT 8*  --   --   ALKPHOS 72  --   --   BILITOT 1.0  --   --   < > = values in this interval not displayed.  Cardiac Enzymes  Recent Labs Lab 11/09/14 2250   TROPONINI 0.05*    Microbiology Results  Results for orders placed or performed during the hospital  encounter of 11/09/14  Culture, blood (routine x 2)     Status: None (Preliminary result)   Collection Time: 11/10/14  9:10 PM  Result Value Ref Range Status   Specimen Description BLOOD RIGHT ARM  Final   Special Requests BOTTLES DRAWN AEROBIC AND ANAEROBIC 9ML  Final   Culture NO GROWTH 2 DAYS  Final   Report Status PENDING  Incomplete  Culture, blood (routine x 2)     Status: None (Preliminary result)   Collection Time: 11/10/14  9:21 PM  Result Value Ref Range Status   Specimen Description BLOOD RIGHT ARM  Final   Special Requests BOTTLES DRAWN AEROBIC AND ANAEROBIC 7ML  Final   Culture NO GROWTH 2 DAYS  Final   Report Status PENDING  Incomplete    RADIOLOGY:  No results found.  EKG:   Orders placed or performed during the hospital encounter of 11/09/14  . ED EKG  . ED EKG  . EKG 12-Lead  . EKG 12-Lead      Management plans discussed with the patient, family and they are in agreement.  CODE STATUS:     Code Status Orders        Start     Ordered   11/10/14 0026  Full code   Continuous     11/10/14 0025      TOTAL TIME TAKING CARE OF THIS PATIENT: 38 minutes.    Gladstone Lighter M.D on 11/12/2014 at 3:26 PM  Between 7am to 6pm - Pager - [phone]  After 6pm go to [email or web address] - password [email or web address] [facility]  Office  [phone]  CC: Primary care physician; Lelon Huh, MD

## 2014-11-12 NOTE — Telephone Encounter (Signed)
Pt is being discharged from [facility] today for GI Bleed.  I have scheduled a follow up appointment for 11/22/2014/MW

## 2014-11-12 NOTE — Progress Notes (Signed)
[place] [facility] [place] NAME: Troy Gallagher    MR#:  [id number]  DATE OF BIRTH:  [birth year]  SUBJECTIVE:  CHIEF COMPLAINT:   Chief Complaint  Patient presents with  . Weakness   - Feeling better today. For hemodialysis today. -Physical therapy pending at this time. Hemoglobin is stable but low.  REVIEW OF SYSTEMS:  Review of Systems  Constitutional: Negative for fever and chills.  HENT: Negative for ear discharge and nosebleeds.   Eyes: Negative for blurred vision and double vision.  Respiratory: Negative for cough, shortness of breath and wheezing.   Cardiovascular: Negative for chest pain and palpitations.  Gastrointestinal: Negative for nausea, vomiting, abdominal pain, diarrhea, constipation and melena.  Genitourinary: Negative for dysuria.  Neurological: Positive for focal weakness and weakness. Negative for dizziness, tremors, seizures and headaches.    DRUG ALLERGIES:   Allergies  Allergen Reactions  . Sulfur Hives    VITALS:  Blood pressure 110/37, pulse 71, temperature 98.6 F (37 C), temperature source Oral, resp. rate 21, height 5\' 9"  (1.753 m), weight 101.1 kg (222 lb 14.2 oz), SpO2 99 %.  PHYSICAL EXAMINATION:  Physical Exam  GENERAL:  69 y.o.-year-old patient lying in the bed with no acute distress.  EYES: Pupils equal, round, reactive to light and accommodation. No scleral icterus. Extraocular muscles intact. Frequent eye blinking noted which is chronic HEENT: Head atraumatic, normocephalic. Oropharynx and nasopharynx clear.  NECK:  Supple, no jugular venous distention. No thyroid enlargement, no tenderness.  LUNGS: Normal breath sounds bilaterally, no wheezing, rhonchi or crepitation. No use of accessory muscles of respiration. Bibasilar crackles noted. CARDIOVASCULAR: S1, S2 normal. No rubs, or gallops. 3/6 systolic murmur present ABDOMEN: Soft, nontender, nondistended. Bowel sounds present. No organomegaly  or mass.  EXTREMITIES: No pedal edema, cyanosis, or clubbing.  NEUROLOGIC: Cranial nerves II through XII are intact. Muscle strength 5/5 on left side and 4/5 on the right side. Sensation intact. Gait not checked.  PSYCHIATRIC: The patient is alert and oriented x 3.  SKIN: No obvious rash, lesion, or ulcer.    LABORATORY PANEL:   CBC  Recent Labs Lab 11/12/14 0443  WBC 6.2  HGB 7.5*  HCT 22.4*  PLT 269   ------------------------------------------------------------------------------------------------------------------  Chemistries   Recent Labs Lab 11/09/14 2250  11/12/14 0443  NA 140  < > 141  K 5.0  < > 3.8  CL 110  < > 104  CO2 19*  < > 27  GLUCOSE 94  < > 99  BUN 63*  < > 41*  CREATININE 8.19*  < > 6.49*  CALCIUM 8.8*  < > 8.5*  MG 1.7  --   --   AST 11*  --   --   ALT 8*  --   --   ALKPHOS 72  --   --   BILITOT 1.0  --   --   < > = values in this interval not displayed. ------------------------------------------------------------------------------------------------------------------  Cardiac Enzymes  Recent Labs Lab 11/09/14 2250  TROPONINI 0.05*   ------------------------------------------------------------------------------------------------------------------  RADIOLOGY:  No results found.  EKG:   Orders placed or performed during the hospital encounter of 11/09/14  . ED EKG  . ED EKG  . EKG 12-Lead  . EKG 12-Lead    ASSESSMENT AND PLAN:   69 year old male with recent stroke about a month ago, status post rehabilitation, end-stage renal disease on Monday Wednesday Friday hemodialysis, hypertension hyperlipidemia, diabetes mellitus proximal from home secondary to  weakness, worsening breathing and melena.  #1 Melena-with acute on chronic anemia noted. - hb stable around 7, no indication for transfusion yet -GI consult apprciated. -EGD and colonoscopy showing Colonic polyps noted and also acute gastritis noted. Likely cause of his  melena. -Protonix twice a day for now, hold aspirin for now. -Transfusion if hemoglobin is less than 7 -Avoid NSAIDS  #2 acute pulmonary edema- improved after dialysis -Continue Lasix.  #3 end-stage renal disease on hemodialysis-Monday Wednesday Friday schedule, For dialysis today per schedule   #4 hypertension-on Norvasc, losartan. Labetalol is on hold. Blood pressure is low normal  #5 Recent CVA-with right-sided weakness noted. Just finished rehabilitation. Continue statin -Aspirin on hold due to GI bleed.  - hold off for now due to acute gastritis and anemia. Restart after 2 weeks at lower dose -Risk of stroke is there but with the anemia and GI bleed we'll hold off at this time. Discussed with patient and he is agreeable with the plan  #6 fever-on admission had a temperature of 10 43F.Likely viral. -No elevated white count, blood cultures negative so far. No further fevers here in the hospital. -UA and chest x-ray normal.Not on any antibiotics   #7 DVT prophylaxis - subcutaneous heparin  Physical therapy consult Is pending at this time. Patient was recently discharged from [facility] after his stroke. Currently he is at home.   All the records are reviewed and case discussed with Care Management/Social Workerr. Management plans discussed with the patient, family and they are in agreement.  CODE STATUS: Full code  TOTAL TIME TAKING CARE OF THIS PATIENT: 37 minutes.   POSSIBLE D/C TODAY OR TOMORROW, DEPENDING ON CLINICAL CONDITION.   Gladstone Lighter M.D on 11/12/2014 at 12:46 PM  Between 7am to 6pm - Pager - [phone]  After 6pm go to [email or web address] - password [email or web address] [facility]  Office  [phone]  CC: Primary care physician; Lelon Huh, MD

## 2014-11-12 NOTE — Evaluation (Signed)
Physical Therapy Evaluation Patient Details Name: Troy Gallagher MRN: [id number] DOB: [birth year] Today's Date: 11/12/2014   History of Present Illness  Pt is a 69 y.o. male presenting to hospital with weakness and decrease Hgb.  Pt admitted with melena with acute on chronic anemia and acute pulmonary edema. Pt s/p colonoscopy 11/11/14.  Pt recently at [place] d/t TIA about 1 month ago.  PMH includes: TIA, COPD, carotid stenosis, L UE fistula, ESRD on HD MWF.  Clinical Impression  Currently pt demonstrates impairments with strength, balance, and limitations with functional mobility s/p dialysis today.  Prior to admission, pt was modified independent with SW and also used manual w/c for MD appts (per pt report).  Pt's daughter lives with pt in 1 level home (pt reports no stairs to enter).  Currently pt is min to mod assist with bed mobility, mod assist to stand with RW, and mod assist to take a couple steps (unable to walk further with RW with assist d/t fatigue and weakness).  Pt would benefit from skilled PT to address above noted impairments and functional limitations.  Recommend pt discharge to STR when medically appropriate.     Follow Up Recommendations SNF    Equipment Recommendations  Rolling walker with 5" wheels    Recommendations for Other Services       Precautions / Restrictions Precautions Precautions: Fall Restrictions Weight Bearing Restrictions: No      Mobility  Bed Mobility Overal bed mobility: Needs Assistance Bed Mobility: Supine to Sit;Sit to Supine     Supine to sit: Mod assist;HOB elevated (assist for trunk) Sit to supine: Min assist (assist for B LE's)      Transfers Overall transfer level: Needs assistance Equipment used: Rolling walker (2 wheeled) Transfers: Sit to/from Stand Sit to Stand: Mod assist         General transfer comment: pt given multiple trials to stand on his own but pt unable with RW; pt then stood 2x's with mod assist and use of RW;  pt required mod assist to scoot to L towards head of bed  Ambulation/Gait Ambulation/Gait assistance: Mod assist Ambulation Distance (Feet): 2 Feet Assistive device: Rolling walker (2 wheeled)   Gait velocity: decreased   General Gait Details: pt with short steps with R LE and pt with minimal advancement with L LE with assist and use of RW; pt with forward posture and unable to stand upright with RW and assist of therapist; pt unable to ambulate further d/t feeling weak and fatigued  Stairs            Wheelchair Mobility    Modified Rankin (Stroke Patients Only)       Balance Overall balance assessment: Needs assistance Sitting-balance support: Bilateral upper extremity supported;Feet supported Sitting balance-Leahy Scale: Fair     Standing balance support: Bilateral upper extremity supported (on RW) Standing balance-Leahy Scale: Poor                               Pertinent Vitals/Pain Pain Assessment: No/denies pain  Vitals stable and WFL throughout treatment session.    Home Living Family/patient expects to be discharged to:: Private residence Living Arrangements: Children (Daughter lives with pt) Available Help at Discharge: Family Type of Home: House Home Access:  (pt reports level entry)     Home Layout: One level Home Equipment: Wheelchair - [employer] - standard      Prior Function Level of Independence: Needs  assistance   Gait / Transfers Assistance Needed: pt walks short distances with SW; uses manual w/c for MD appts           Hand Dominance        Extremity/Trunk Assessment   Upper Extremity Assessment: Generalized weakness           Lower Extremity Assessment: Generalized weakness         Communication   Communication: No difficulties  Cognition Arousal/Alertness: Awake/alert Behavior During Therapy: WFL for tasks assessed/performed Overall Cognitive Status: No family/caregiver present to determine baseline  cognitive functioning (Pt oriented to place and person but not time )                      General Comments   Nursing cleared pt for participation in physical therapy.  Pt agreeable to PT session.    Exercises        Assessment/Plan    PT Assessment Patient needs continued PT services  PT Diagnosis Difficulty walking;Generalized weakness   PT Problem List Decreased strength;Decreased activity tolerance;Decreased balance;Decreased mobility;Decreased cognition;Decreased knowledge of use of DME;Decreased safety awareness  PT Treatment Interventions DME instruction;Gait training;Stair training;Therapeutic activities;Functional mobility training;Therapeutic exercise;Balance training;Patient/family education   PT Goals (Current goals can be found in the Care Plan section) Acute Rehab PT Goals Patient Stated Goal: to go home PT Goal Formulation: With patient/family Time For Goal Achievement: 11/26/14 Potential to Achieve Goals: Fair    Frequency Min 2X/week   Barriers to discharge Decreased caregiver support      Co-evaluation               End of Session Equipment Utilized During Treatment: Gait belt Activity Tolerance: Patient limited by fatigue Patient left: in bed;with call bell/phone within reach;with bed alarm set Nurse Communication: Mobility status         Time: 3846-6599 PT Time Calculation (min) (ACUTE ONLY): 24 min   Charges:   PT Evaluation $Initial PT Evaluation Tier I: 1 Procedure PT Treatments $Therapeutic Activity: 8-22 mins   PT G CodesLeitha Bleak 12-06-14, 3:49 PM Leitha Bleak, [place]

## 2014-11-15 LAB — CULTURE, BLOOD (ROUTINE X 2)
CULTURE: NO GROWTH
Culture: NO GROWTH

## 2014-11-17 ENCOUNTER — Encounter: Payer: Self-pay | Admitting: Gastroenterology

## 2014-11-22 ENCOUNTER — Inpatient Hospital Stay: Payer: No Typology Code available for payment source | Admitting: Family Medicine

## 2014-11-30 ENCOUNTER — Encounter: Payer: Self-pay | Admitting: Family Medicine

## 2014-11-30 DIAGNOSIS — Z86010 Personal history of colonic polyps: Secondary | ICD-10-CM | POA: Insufficient documentation

## 2014-12-31 ENCOUNTER — Telehealth: Payer: Self-pay

## 2014-12-31 ENCOUNTER — Telehealth: Payer: Self-pay | Admitting: Family Medicine

## 2014-12-31 NOTE — Telephone Encounter (Signed)
LMOVM for pt's wife to return call. 

## 2014-12-31 NOTE — Telephone Encounter (Signed)
Patients wife called back wanting to know the status of the order for the hospital bed.  Please call her back asap.  Call back [phone]  Thank sTeri

## 2014-12-31 NOTE — Telephone Encounter (Signed)
Pt's daughter Amy called to see what she needed to do to get pt home health care. Amy stated that she was going today to get pt out of rehab b/c the bill was getting to high for him. I advised that I would send back a message. Amy asked if she would know something today b/c she needs something asap. I advised that it does take time to get home health but I wasn't sure. Amy asked that if someone could call her back today could they leave the info on her machine b/c she has so much to do she can't be talking on the phone. I explained that I understood and that we wouldn't be able to leave detailed messages about pt on her machine. Amy said ok well I guess I will have to talk later and hung up. Thanks TNP

## 2014-12-31 NOTE — Telephone Encounter (Signed)
Patient wife called stating  If doctor Caryn Section can write the order in for the patient to get the orthopedic bed. Per patient's wife doctor Caryn Section has the paper work for the order. And they are waiting on the order. Do you about this? Per patient doesn't want patient to have more of the spells. CB# is [phone]  Thanks,  -Catcher Dehoyos

## 2014-12-31 NOTE — Telephone Encounter (Signed)
Left message on Amy's vm. Okay per Amy's request.

## 2014-12-31 NOTE — Telephone Encounter (Signed)
i have no idea what she is talking about. I have not seen any paperwork or orders for this patient.

## 2014-12-31 NOTE — Telephone Encounter (Signed)
Home Health is usually set up by Social Workers at the facility he is being discharged from  If they cannot arrange it then we can order [facility], but it takes longer and will probably not be able to get home health to his house until next week.

## 2015-01-03 NOTE — Telephone Encounter (Signed)
See other message from 01/03/2015.

## 2015-01-03 NOTE — Telephone Encounter (Signed)
Returned call. Troy Gallagher stated that she is waiting for someone to contact her and confirm approval for home health, hospital bed.

## 2015-01-03 NOTE — Telephone Encounter (Signed)
Pt's daughter Amy called back stating that she was returning Michelle's call and she also spoke with a lady that told her they were going to come to the office to discuss getting pt help. I also got the message below in my staff box Message -----    From: Emmit Pomfret    Sent: 12/31/2014  11:50 AM      To: Arnoldo Morale Presnell Subject: home health                                    Hey teresa  I saw your note in EPIC and just wanted to let you know [place] called as well to set up home health for [facility] so I will be happy to handle getting it set up for weekend.  Thanks Sharrie Rothman   Thanks TNP

## 2015-01-04 ENCOUNTER — Telehealth: Payer: Self-pay | Admitting: Family Medicine

## 2015-01-04 NOTE — Telephone Encounter (Signed)
He can take OTC Kaopectate

## 2015-01-04 NOTE — Telephone Encounter (Signed)
Pt's daughter called saying her dad needed something for loose stools.  It may have been something he had to eat.   [facility] ask that they call us.  It can be something OTC,  Call back is   [phone]  Thanks teri

## 2015-01-05 NOTE — Telephone Encounter (Signed)
Patient's daughter amy was notified.

## 2015-01-28 ENCOUNTER — Telehealth: Payer: Self-pay | Admitting: Family Medicine

## 2015-01-28 DIAGNOSIS — F32A Depression, unspecified: Secondary | ICD-10-CM

## 2015-01-28 DIAGNOSIS — F329 Major depressive disorder, single episode, unspecified: Secondary | ICD-10-CM

## 2015-01-28 NOTE — Telephone Encounter (Signed)
She seen him yesterday for starting his home health service.  She did a depression screen and it showed positive for depression and she wants to know if you want to start something for him for depression.  Please call her back at  [phone]  Thanks, Con Memos

## 2015-02-01 NOTE — Telephone Encounter (Signed)
LMOVM for Beth to call back.

## 2015-02-01 NOTE — Telephone Encounter (Signed)
Can start fluoxetine 20mg  one capsule daily, #30, rf x 1

## 2015-02-01 NOTE — Telephone Encounter (Signed)
Nurse from [facility].

## 2015-02-03 ENCOUNTER — Telehealth: Payer: Self-pay

## 2015-02-03 NOTE — Telephone Encounter (Signed)
Will from [facility] (formerly [facility] called to let Dr. Caryn Section know that they have received the order for OT. The evaluation is completed and they will be faxing a plan if care for patient to be picked up to have OT 2 times a week for 3 weeks. Any additional questions call Will back at [phone].

## 2015-02-04 MED ORDER — FLUOXETINE HCL 20 MG PO TABS: 20.0000 mg | ORAL_TABLET | Freq: Every day | ORAL | Status: DC

## 2015-02-04 NOTE — Telephone Encounter (Signed)
[employer] as below. Sent in Rx into [facility] per patient request.

## 2015-02-11 ENCOUNTER — Encounter (INDEPENDENT_AMBULATORY_CARE_PROVIDER_SITE_OTHER): Payer: Medicare Other | Admitting: Family Medicine

## 2015-02-11 DIAGNOSIS — I120 Hypertensive chronic kidney disease with stage 5 chronic kidney disease or end stage renal disease: Secondary | ICD-10-CM | POA: Diagnosis not present

## 2015-02-11 DIAGNOSIS — N186 End stage renal disease: Secondary | ICD-10-CM | POA: Diagnosis not present

## 2015-02-11 DIAGNOSIS — F329 Major depressive disorder, single episode, unspecified: Secondary | ICD-10-CM

## 2015-02-11 DIAGNOSIS — E1122 Type 2 diabetes mellitus with diabetic chronic kidney disease: Secondary | ICD-10-CM

## 2015-02-22 ENCOUNTER — Telehealth: Payer: Self-pay

## 2015-02-22 NOTE — Telephone Encounter (Signed)
Will with occupational therapy with [facility] called and states that patient's B/P is 171/74 right now and per protocol they need to call. Patient is not having any chest discomfort, some SOB but O2 was 96% when checked. Will also mentions that patient has not had his B/P medication today due to leaving it at dialysis and suppose to take it 3 times daily, his family member is going to get the medication. Advised Will that patient should follow up within 1 to 2 weeks especially if B/P does not improve once he is back on medication.-aa

## 2015-02-22 NOTE — Telephone Encounter (Signed)
[facility] called back at 5:00.    FYI  Pt is refusing PT.  Has missed two visits this week.  Thanks Con Memos

## 2015-03-01 ENCOUNTER — Telehealth: Payer: Self-pay

## 2015-03-01 NOTE — Telephone Encounter (Signed)
[facility] S[facility], a physical therapist, called saying that patient refused to be seen on Friday of last week (02/25/15) and it was no response on Sat (02/26/15). He just wanted to make you aware of this.

## 2015-03-01 NOTE — Telephone Encounter (Signed)
Pharmacy Tech from [facility] called wanting to follow up on a fax they sent last week. She states they sent a fax stating that a medication that the patient is on " Indomethacin" is not recommended due to patient in renal failure. She wanted to make sure Dr. Caryn Section is aware of this. Please call back so that she can document that she has consulted with patients provider and the provider is aware or to let her know if the medication needs to be changed. Call back is [phone].   Reference # is [id number]

## 2015-03-03 ENCOUNTER — Telehealth: Payer: Self-pay | Admitting: Family Medicine

## 2015-03-03 NOTE — Telephone Encounter (Signed)
Tiffany with [facility] called to get clarification for the Rx Indomethacin 50mg .  [id number]

## 2015-03-04 NOTE — Telephone Encounter (Signed)
Spoke with [facility] who stated that the patient has requested a refill for indomethacin 50 mg. [facility] wanted to check with Dr. Caryn Section first about the med because of certain risks with pt being on dialysis. [facility] stated that they will discontinue request for indomethacin and if Dr. Caryn Section approves then rx can be resubmitted. Please advise?

## 2015-03-04 NOTE — Telephone Encounter (Signed)
Tiffany was available. Will try again later.

## 2015-04-13 ENCOUNTER — Other Ambulatory Visit: Payer: Self-pay | Admitting: Family Medicine

## 2015-04-13 DIAGNOSIS — F32A Depression, unspecified: Secondary | ICD-10-CM

## 2015-04-13 DIAGNOSIS — F329 Major depressive disorder, single episode, unspecified: Secondary | ICD-10-CM

## 2015-04-13 MED ORDER — FLUOXETINE HCL 20 MG PO TABS: 20.0000 mg | ORAL_TABLET | Freq: Every day | ORAL | Status: DC

## 2015-04-13 NOTE — Telephone Encounter (Signed)
Pt contacted office for refill request on the following medications:  FLUoxetine (PROZAC) 20 MG tablet.  [place].  [id number]

## 2015-04-25 ENCOUNTER — Other Ambulatory Visit: Payer: Self-pay | Admitting: Vascular Surgery

## 2015-04-26 ENCOUNTER — Ambulatory Visit
Admission: RE | Admit: 2015-04-26 | Discharge: 2015-04-26 | Disposition: A | Discharge status: Home / Self Care | Payer: Medicare Other | Source: Ambulatory Visit | Attending: Vascular Surgery | Admitting: Vascular Surgery

## 2015-04-26 ENCOUNTER — Encounter
Admission: RE | Disposition: A | Discharge status: Home / Self Care | Payer: Self-pay | Source: Ambulatory Visit | Attending: Vascular Surgery

## 2015-04-26 DIAGNOSIS — Z8249 Family history of ischemic heart disease and other diseases of the circulatory system: Secondary | ICD-10-CM | POA: Diagnosis not present

## 2015-04-26 DIAGNOSIS — N400 Enlarged prostate without lower urinary tract symptoms: Secondary | ICD-10-CM | POA: Insufficient documentation

## 2015-04-26 DIAGNOSIS — Z992 Dependence on renal dialysis: Secondary | ICD-10-CM | POA: Diagnosis not present

## 2015-04-26 DIAGNOSIS — Z9889 Other specified postprocedural states: Secondary | ICD-10-CM | POA: Diagnosis not present

## 2015-04-26 DIAGNOSIS — N186 End stage renal disease: Secondary | ICD-10-CM | POA: Diagnosis not present

## 2015-04-26 DIAGNOSIS — D126 Benign neoplasm of colon, unspecified: Secondary | ICD-10-CM | POA: Insufficient documentation

## 2015-04-26 DIAGNOSIS — T82868A Thrombosis of vascular prosthetic devices, implants and grafts, initial encounter: Secondary | ICD-10-CM | POA: Insufficient documentation

## 2015-04-26 DIAGNOSIS — Z8042 Family history of malignant neoplasm of prostate: Secondary | ICD-10-CM | POA: Insufficient documentation

## 2015-04-26 DIAGNOSIS — R51 Headache: Secondary | ICD-10-CM | POA: Diagnosis not present

## 2015-04-26 DIAGNOSIS — Y832 Surgical operation with anastomosis, bypass or graft as the cause of abnormal reaction of the patient, or of later complication, without mention of misadventure at the time of the procedure: Secondary | ICD-10-CM | POA: Insufficient documentation

## 2015-04-26 DIAGNOSIS — G2581 Restless legs syndrome: Secondary | ICD-10-CM | POA: Insufficient documentation

## 2015-04-26 DIAGNOSIS — I6529 Occlusion and stenosis of unspecified carotid artery: Secondary | ICD-10-CM | POA: Diagnosis not present

## 2015-04-26 DIAGNOSIS — Z87891 Personal history of nicotine dependence: Secondary | ICD-10-CM | POA: Insufficient documentation

## 2015-04-26 DIAGNOSIS — K219 Gastro-esophageal reflux disease without esophagitis: Secondary | ICD-10-CM | POA: Diagnosis not present

## 2015-04-26 DIAGNOSIS — J449 Chronic obstructive pulmonary disease, unspecified: Secondary | ICD-10-CM | POA: Insufficient documentation

## 2015-04-26 DIAGNOSIS — R569 Unspecified convulsions: Secondary | ICD-10-CM | POA: Insufficient documentation

## 2015-04-26 DIAGNOSIS — Z8673 Personal history of transient ischemic attack (TIA), and cerebral infarction without residual deficits: Secondary | ICD-10-CM | POA: Diagnosis not present

## 2015-04-26 DIAGNOSIS — I120 Hypertensive chronic kidney disease with stage 5 chronic kidney disease or end stage renal disease: Secondary | ICD-10-CM | POA: Diagnosis not present

## 2015-04-26 DIAGNOSIS — F419 Anxiety disorder, unspecified: Secondary | ICD-10-CM | POA: Diagnosis not present

## 2015-04-26 DIAGNOSIS — E1122 Type 2 diabetes mellitus with diabetic chronic kidney disease: Secondary | ICD-10-CM | POA: Insufficient documentation

## 2015-04-26 DIAGNOSIS — Z882 Allergy status to sulfonamides status: Secondary | ICD-10-CM | POA: Insufficient documentation

## 2015-04-26 DIAGNOSIS — E785 Hyperlipidemia, unspecified: Secondary | ICD-10-CM | POA: Insufficient documentation

## 2015-04-26 DIAGNOSIS — I739 Peripheral vascular disease, unspecified: Secondary | ICD-10-CM | POA: Diagnosis not present

## 2015-04-26 HISTORY — PX: PERIPHERAL VASCULAR CATHETERIZATION: SHX172C

## 2015-04-26 LAB — POTASSIUM (ARMC VASCULAR LAB ONLY): Potassium (ARMC vascular lab): 3.5 (ref 3.5–5.1)

## 2015-04-26 SURGERY — A/V SHUNTOGRAM/FISTULAGRAM
Anesthesia: Moderate Sedation

## 2015-04-26 MED ORDER — FAMOTIDINE 20 MG PO TABS: 40.0000 mg | ORAL_TABLET | ORAL | Status: DC | PRN

## 2015-04-26 MED ORDER — HEPARIN SODIUM (PORCINE) 1000 UNIT/ML IJ SOLN
INTRAMUSCULAR | Status: DC | PRN
  Administered 2015-04-26: 3000 [IU] via INTRAVENOUS

## 2015-04-26 MED ORDER — ACETAMINOPHEN 325 MG PO TABS
650.0000 mg | ORAL_TABLET | Freq: Once | ORAL | Status: AC
  Administered 2015-04-26: 650 mg via ORAL

## 2015-04-26 MED ORDER — MIDAZOLAM HCL 5 MG/5ML IJ SOLN
INTRAMUSCULAR | Status: AC
  Filled 2015-04-26: qty 5

## 2015-04-26 MED ORDER — FENTANYL CITRATE (PF) 100 MCG/2ML IJ SOLN
INTRAMUSCULAR | Status: AC
  Filled 2015-04-26: qty 2

## 2015-04-26 MED ORDER — ACETAMINOPHEN 325 MG PO TABS
ORAL_TABLET | ORAL | Status: AC
  Filled 2015-04-26: qty 2

## 2015-04-26 MED ORDER — HEPARIN (PORCINE) IN NACL 2-0.9 UNIT/ML-% IJ SOLN
INTRAMUSCULAR | Status: AC
  Filled 2015-04-26: qty 1000

## 2015-04-26 MED ORDER — LIDOCAINE HCL (PF) 1 % IJ SOLN
INTRAMUSCULAR | Status: AC
  Filled 2015-04-26: qty 30

## 2015-04-26 MED ORDER — METHYLPREDNISOLONE SODIUM SUCC 125 MG IJ SOLR: 125.0000 mg | INTRAMUSCULAR | Status: DC | PRN

## 2015-04-26 MED ORDER — HYDROMORPHONE HCL 1 MG/ML IJ SOLN: 1.0000 mg | Freq: Once | INTRAMUSCULAR | Status: DC

## 2015-04-26 MED ORDER — ONDANSETRON HCL 4 MG/2ML IJ SOLN: 4.0000 mg | Freq: Four times a day (QID) | INTRAMUSCULAR | Status: DC | PRN

## 2015-04-26 MED ORDER — CEFUROXIME SODIUM 1.5 G IJ SOLR: 1.5000 g | INTRAMUSCULAR | Status: DC

## 2015-04-26 MED ORDER — SODIUM CHLORIDE 0.9 % IV SOLN: INTRAVENOUS | Status: DC

## 2015-04-26 MED ORDER — MIDAZOLAM HCL 2 MG/2ML IJ SOLN
INTRAMUSCULAR | Status: DC | PRN
  Administered 2015-04-26: 1 mg via INTRAVENOUS

## 2015-04-26 MED ORDER — FENTANYL CITRATE (PF) 100 MCG/2ML IJ SOLN
INTRAMUSCULAR | Status: DC | PRN
  Administered 2015-04-26: 50 ug via INTRAVENOUS

## 2015-04-26 MED ORDER — HEPARIN SODIUM (PORCINE) 1000 UNIT/ML IJ SOLN
INTRAMUSCULAR | Status: AC
  Filled 2015-04-26: qty 1

## 2015-04-26 SURGICAL SUPPLY — 13 items
BALLN ARMADA 10X80X80 (BALLOONS) ×3
BALLN ATG 12X6X80 (BALLOONS) ×3
BALLOON ARMADA 10X80X80 (BALLOONS) IMPLANT
BALLOON ATG 12X6X80 (BALLOONS) IMPLANT
DEVICE PRESTO INFLATION (MISCELLANEOUS) ×2 IMPLANT
DRAPE BRACHIAL (DRAPES) ×3 IMPLANT
PACK ANGIOGRAPHY (CUSTOM PROCEDURE TRAY) ×3 IMPLANT
SET INTRO CAPELLA COAXIAL (SET/KITS/TRAYS/PACK) ×3 IMPLANT
SHEATH BRITE TIP 6FRX5.5 (SHEATH) ×3 IMPLANT
SHEATH PINNACLE 11FRX10 (SHEATH) ×2 IMPLANT
STENT VIABAHN 13X100X120 (Permanent Stent) ×2 IMPLANT
TOWEL OR 17X26 4PK STRL BLUE (TOWEL DISPOSABLE) ×3 IMPLANT
WIRE MAGIC TORQUE 260C (WIRE) ×2 IMPLANT

## 2015-04-26 NOTE — Discharge Instructions (Signed)
Fistulogram, Care After °Refer to this sheet in the next few weeks. These instructions provide you with information on caring for yourself after your procedure. Your health care provider may also give you more specific instructions. Your treatment has been planned according to current medical practices, but problems sometimes occur. Call your health care provider if you have any problems or questions after your procedure. °WHAT TO EXPECT AFTER THE PROCEDURE °After your procedure, it is typical to have the following: °· A small amount of discomfort in the area where the catheters were placed. °· A small amount of bruising around the fistula. °· Sleepiness and fatigue. °HOME CARE INSTRUCTIONS °· Rest at home for the day following your procedure. °· Do not drive or operate heavy machinery while taking pain medicine. °· Take medicines only as directed by your health care provider. °· Do not take baths, swim, or use a hot tub until your health care provider approves. You may shower 24 hours after the procedure or as directed by your health care provider. °· There are many different ways to close and cover an incision, including stitches, skin glue, and adhesive strips. Follow your health care provider's instructions on: °¨ Incision care. °¨ Bandage (dressing) changes and removal. °¨ Incision closure removal. °· Monitor your dialysis fistula carefully. °SEEK MEDICAL CARE IF: °· You have drainage, redness, swelling, or pain at your catheter site. °· You have a fever. °· You have chills. °SEEK IMMEDIATE MEDICAL CARE IF: °· You feel weak. °· You have trouble balancing. °· You have trouble moving your arms or legs. °· You have problems with your speech or vision. °· You can no longer feel a vibration or buzz when you put your fingers over your dialysis fistula. °· The limb that was used for the procedure: °¨ Swells. °¨ Is painful. °¨ Is cold. °¨ Is discolored, such as blue or pale white. °  °This information is not intended  to replace advice given to you by your health care provider. Make sure you discuss any questions you have with your health care provider. °  °Document Released: 06/01/2013 Document Reviewed: 06/01/2013 °Elsevier Interactive Patient Education ©2016 Elsevier Inc. ° °

## 2015-04-26 NOTE — Op Note (Signed)
OPERATIVE NOTE   PROCEDURE: 1. Contrast injection left arm brachiocephalic fistula 2. Percutaneous transluminal and plasty and stent placement at the cephalic subclavian confluence left arm brachiocephalic fistula  PRE-OPERATIVE DIAGNOSIS: Complication of dialysis access                                                       End Stage Renal Disease  POST-OPERATIVE DIAGNOSIS: same as above   SURGEON: Gregory G. Schnier, M.D.  ANESTHESIA: Conscious sedation was administered under my direct supervision. IV Versed plus fentanyl were utilized. Continuous ECG, pulse oximetry and blood pressure was monitored throughout the entire procedure. A total of 2 milligrams of Versed and 50 micrograms of fentanyl were utilized.  Conscious sedation was for a total of 40 minutes.  ESTIMATED BLOOD LOSS: minimal  FINDING(S): 1. Stricture at the cephalic fluent  SPECIMEN(S):  None  CONTRAST: 60 cc  FLUOROSCOPY TIME: 2.9 minutes  INDICATIONS: Troy Gallagher is a 69 y.o. male who  presents with malfunctioning left arm brachiocephalic AV access.  The patient is scheduled for angiography with possible intervention of the AV access.  The patient is aware the risks include but are not limited to: bleeding, infection, thrombosis of the cannulated access, and possible anaphylactic reaction to the contrast.  The patient acknowledges if the access can not be salvaged a tunneled catheter will be needed and will be placed during this procedure.  The patient is aware of the risks of the procedure and elects to proceed with the angiogram and intervention.  DESCRIPTION: After full informed written consent was obtained, the patient was brought back to the Special Procedure suite and placed supine position.  Appropriate cardiopulmonary monitors were placed.  The left arm was prepped and draped in the standard fashion.  Appropriate timeout is called. The brachial cephalic fistula  was cannulated with a micropuncture needle in  an antegrade direction just above the arterial anastomosis without difficulty.  The microwire was advanced and the needle was exchanged for  a microsheath.  The J-wire was then advanced and a 6 Fr sheath inserted.  Hand injections were completed to image the access from the arterial anastomosis through the entire access.  The central venous structures were also imaged by hand injections.  Based on the images,  3000 units of heparin was given the sheath was upsized to a 13 French sheath. Magic torque wire was then advanced through the stenosis at the cephalic confluence and positioned with the tip in the superior vena cava. A 13 x 100 Viabahn stent was then deployed across the lesion and postdilated first with a 10 mm balloon and then a 12 mm Atlas balloon. Inflations were for 30 seconds to a maximum of 24 atm. Follow-up imaging demonstrated an excellent result with 0% residual stenosis central venous anatomy is preserved. Clinically there is now a continuous thrill within the fistula and there is no longer any pulsatility    A 4-0 Monocryl purse-string suture was sewn around the sheath.  The sheath was removed and light pressure was applied.  A sterile bandage was applied to the puncture site.    COMPLICATIONS: None  CONDITION: Improved  Gregory G. Schnier, M.D [facility] Office: [phone]  04/26/2015 2:38 PM   

## 2015-04-26 NOTE — Progress Notes (Signed)
Patient is alert and oriented. Reporting headache, tylenol administered. Left arm CDI, site WNL. Reviewed discharge instructions and follow up with patient and daughters. Discharged home per orders

## 2015-04-26 NOTE — H&P (Signed)
[facility] Admission History & Physical  MRN : [id number]  Troy Gallagher is a 70 y.o. (01-10-1946) male who presents with chief complaint of No chief complaint on file. Marland Kitchen  History of Present Illness: The patient is sent by the dialysis center for increasing difficulty with dialysis. In fact recently he has not been able to complete his runs secondary to difficulties with his fistula. There are concerns about decreasing flow rates measured by the dialysis center as well as "pulling clots" from the needles as dialysis progresses.  The patient denies arm pain he denies prolonged bleeding. No fevers or chills while at home or on dialysis. He denies hand discomfort or symptoms consistent with steal syndrome.  Current Facility-Administered Medications  Medication Dose Route Frequency Provider Last Rate Last Dose  . 0.9 %  sodium chloride infusion   Intravenous Continuous Kimberly A Stegmayer, PA-C      . cefUROXime (ZINACEF) 1.5 g in dextrose 5 % 50 mL IVPB  1.5 g Intravenous 30 min Pre-Op Kimberly A Stegmayer, PA-C      . famotidine (PEPCID) tablet 40 mg  40 mg Oral PRN Janalyn Harder Stegmayer, PA-C      . HYDROmorphone (DILAUDID) injection 1 mg  1 mg Intravenous Once [employer], PA-C      . methylPREDNISolone sodium succinate (SOLU-MEDROL) 125 mg/2 mL injection 125 mg  125 mg Intravenous PRN Kimberly A Stegmayer, PA-C      . ondansetron (ZOFRAN) injection 4 mg  4 mg Intravenous Q6H PRN Sela Hua, PA-C        Past Medical History  Diagnosis Date  . Complication of anesthesia   . Hypertension   . Stroke ([place])   . GERD (gastroesophageal reflux disease)   . Headache   . Kidney dialysis status [id number]  . HLD (hyperlipidemia)   . Diabetes mellitus, type II ([place])   . Seizure ([place])   . BPH (benign prostatic hyperplasia)   . COPD (chronic obstructive pulmonary disease) ([place])   . PVD (peripheral vascular disease) ([place])   . Carotid stenosis   .  Adenoma of large intestine   . ESRD on hemodialysis ([place])     MWF  . Anxiety   . RLS (restless legs syndrome)     Past Surgical History  Procedure Laterality Date  . Shoulder arthroscopy w/ rotator cuff repair Left 1999  . Neck surgery N/A 10/24/2009    Cervical Diskectomy- C3-4, 4-5, 5-6 anterior disckectomy by Dr. Arnoldo Morale at [facility]  . Av fistula placement Left 2 years  . Colonoscopy N/A 06/14/2014    Procedure: COLONOSCOPY;  Surgeon: Hulen Luster, MD;  Location: [facility] ENDOSCOPY;  Service: Gastroenterology;  Laterality: N/A;  . Carotid endarterectomy Right 06/05/2013    Dr. Delana Meyer  . Tonsillectomy and adenoidectomy  1950  . 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 Repeat in 3 years  . Carotid doppler ultrasound  04/22/2013    85-90% stenosis of the RICA, 60-70% stenosis of left cmmon carotid and origin of left subclavian  . Doppler echocardiography  12/04/2011    Bilateral stenosis 80%  . Arch aortogram  04/07/2013    85-90% Stenosis origin of right ICA. 65-70% narrowing of left CCA. 50-60% narrowing of left subclavian artery  . Colonoscopy with propofol N/A 11/11/2014    Procedure: COLONOSCOPY WITH PROPOFOL;  Surgeon: Hulen Luster, MD;  Location[facility] ENDOSCOPY;  Service: Endoscopy;  Laterality:  N/A;  . Esophagogastroduodenoscopy (egd) with propofol N/A 11/11/2014    Procedure: ESOPHAGOGASTRODUODENOSCOPY (EGD) WITH PROPOFOL;  Surgeon: Hulen Luster, MD;  Location: [facility] ENDOSCOPY;  Service: Endoscopy;  Laterality: N/A;    Social History Social History  Substance Use Topics  . Smoking status: Former Smoker -- 2.00 packs/day for 30 years    Types: Cigarettes    Quit date: 01/29/2002  . Smokeless tobacco: Current User  . Alcohol Use: No    Family History Family History  Problem Relation Age of Onset  . Stroke Mother   . Prostate cancer Father   . Heart disease Sister   No family history of bleeding clotting  disorders, porphyria, autoimmune disease  Allergies  Allergen Reactions  . Sulfur Hives     REVIEW OF SYSTEMS (Negative unless checked)  Constitutional: [] Weight loss  [] Fever  [] Chills Cardiac: [] Chest pain   [] Chest pressure   [] Palpitations   [] Shortness of breath when laying flat   [] Shortness of breath at rest   [] Shortness of breath with exertion. Vascular:  [] Pain in legs with walking   [] Pain in legs at rest   [] Pain in legs when laying flat   [] Claudication   [] Pain in feet when walking  [] Pain in feet at rest  [] Pain in feet when laying flat   [] History of DVT   [] Phlebitis   [] Swelling in legs   [] Varicose veins   [] Non-healing ulcers Pulmonary:   [] Uses home oxygen   [] Productive cough   [] Hemoptysis   [] Wheeze  [] COPD   [] Asthma Neurologic:  [] Dizziness  [] Blackouts   [] Seizures   [] History of stroke   [] History of TIA  [] Aphasia   [] Temporary blindness   [] Dysphagia   [] Weakness or numbness in arms   [] Weakness or numbness in legs Musculoskeletal:  [] Arthritis   [] Joint swelling   [] Joint pain   [] Low back pain Hematologic:  [] Easy bruising  [] Easy bleeding   [] Hypercoagulable state   [] Anemic  [] Hepatitis Gastrointestinal:  [] Blood in stool   [] Vomiting blood  [] Gastroesophageal reflux/heartburn   [] Difficulty swallowing. Genitourinary:  [] Chronic kidney disease   [] Difficult urination  [] Frequent urination  [] Burning with urination   [] Blood in urine Skin:  [] Rashes   [] Ulcers   [] Wounds Psychological:  [] History of anxiety   []  History of major depression.  Physical Examination  Filed Vitals:   04/26/15 1318  BP: 116/83  Pulse: 67  Temp: 98.5 F (36.9 C)  TempSrc: Oral  Resp: 20  Height: 5\' 9"  (1.753 m)  Weight: 97.977 kg (216 lb)  SpO2: 95%   Body mass index is 31.88 kg/(m^2). Gen: WD/WN, NAD Head: [place]/AT, No temporalis wasting.  Ear/Nose/Throat: Hearing grossly intact, nares w/o erythema or drainage, oropharynx w/o Erythema/Exudate, Eyes: PERRLA, EOMI.   Neck: Supple, no nuchal rigidity.  No bruit or JVD.  Pulmonary:  Good air movement, clear to auscultation bilaterally, no increased work of respiration or use of accessory muscles  Cardiac: RRR, normal S1, S2, no Murmurs, rubs or gallops. Vascular: AV fistula with marked pulsatility and a staccato bruit consistent with proximal stenosis Vessel Right Left  Radial Palpable Palpable  Ulnar Palpable Palpable  Brachial Palpable Palpable  Carotid Palpable, without bruit Palpable, without bruit   Gastrointestinal: soft, non-tender/non-distended. No guarding/reflex. No masses, surgical incisions, or scars. Musculoskeletal: M/S 5/5 throughout.  No deformity or atrophy. Neurologic: CN 2-12 intact. Pain and light touch intact in extremities.  Symmetrical.  Speech is fluent. Motor exam as listed above. Psychiatric: Judgment intact, Mood &  affect appropriate for pt's clinical situation. Dermatologic: No rashes or ulcers noted.  No cellulitis or open wounds. Lymph : No Cervical, Axillary, or Inguinal lymphadenopathy.   CBC Lab Results  Component Value Date   WBC 6.2 11/12/2014   HGB 7.5* 11/12/2014   HCT 22.4* 11/12/2014   MCV 95.4 11/12/2014   PLT 269 11/12/2014    BMET    Component Value Date/Time   NA 141 11/12/2014 0443   NA 139 04/02/2014   NA 137 01/19/2014 1354   K 3.8 11/12/2014 0443   K 2.8* 01/19/2014 1354   CL 104 11/12/2014 0443   CL 100 01/19/2014 1354   CO2 27 11/12/2014 0443   CO2 26 01/19/2014 1354   GLUCOSE 99 11/12/2014 0443   GLUCOSE 100* 01/19/2014 1354   BUN 41* 11/12/2014 0443   BUN 31* 04/02/2014   BUN 41* 01/19/2014 1354   CREATININE 6.49* 11/12/2014 0443   CREATININE 3.7* 04/02/2014   CREATININE 4.92* 01/19/2014 1354   CALCIUM 8.5* 11/12/2014 0443   CALCIUM 9.4 01/19/2014 1354   GFRNONAA 8* 11/12/2014 0443   GFRNONAA 13* 01/19/2014 1354   GFRNONAA 16* 06/06/2013 0501   GFRAA 9* 11/12/2014 0443   GFRAA 15* 01/19/2014 1354   GFRAA 18* 06/06/2013 0501    CrCl cannot be calculated (Patient has no serum creatinine result on file.).  COAG Lab Results  Component Value Date   INR 1.26 11/09/2014   INR 1.06 10/04/2014   INR 1.16 09/30/2014    Radiology No results found.  Assessment/Plan 1.  Complication dialysis device with thrombosis AV access:  Patient's dialysis access is functioning poorly and causing interruption of his dialysis therapy. The patient will undergo angiography and hopefully repair using interventional techniques. Potassium will be drawn to ensure that it is an appropriate level prior to performing thrombectomy. 2.  End-stage renal disease requiring hemodialysis:  Patient will continue dialysis therapy without further interruption if a successful salvage is not achieved then catheter will be placed. Dialysis has already been arranged since the patient missed their previous session 3.  Hypertension:  Patient will continue medical management; nephrology is following no changes in oral medications. 4. Diabetes mellitus:  Glucose will be monitored and oral medications been held this morning once the patient has undergone the patient's procedure po intake will be reinitiated and again Accu-Cheks will be used to assess the blood glucose level and treat as needed. The patient will be restarted on the patient's usual hypoglycemic regime 5.  COPD:  He will be continued on his home aerosol therapy   Schnier, Dolores Lory, MD  04/26/2015 1:35 PM

## 2015-05-01 ENCOUNTER — Encounter: Payer: Self-pay | Admitting: Vascular Surgery

## 2015-05-11 ENCOUNTER — Encounter: Payer: Self-pay | Admitting: Emergency Medicine

## 2015-05-11 ENCOUNTER — Emergency Department
Admission: EM | Admit: 2015-05-11 | Discharge: 2015-05-11 | Disposition: A | Discharge status: Home / Self Care | Payer: Medicare Other | Attending: Emergency Medicine | Admitting: Emergency Medicine

## 2015-05-11 ENCOUNTER — Ambulatory Visit: Payer: Medicare Other | Admitting: Family Medicine

## 2015-05-11 DIAGNOSIS — Z791 Long term (current) use of non-steroidal anti-inflammatories (NSAID): Secondary | ICD-10-CM | POA: Diagnosis not present

## 2015-05-11 DIAGNOSIS — J449 Chronic obstructive pulmonary disease, unspecified: Secondary | ICD-10-CM | POA: Insufficient documentation

## 2015-05-11 DIAGNOSIS — F329 Major depressive disorder, single episode, unspecified: Secondary | ICD-10-CM | POA: Insufficient documentation

## 2015-05-11 DIAGNOSIS — G459 Transient cerebral ischemic attack, unspecified: Secondary | ICD-10-CM | POA: Diagnosis not present

## 2015-05-11 DIAGNOSIS — N400 Enlarged prostate without lower urinary tract symptoms: Secondary | ICD-10-CM | POA: Diagnosis not present

## 2015-05-11 DIAGNOSIS — Z86718 Personal history of other venous thrombosis and embolism: Secondary | ICD-10-CM | POA: Diagnosis not present

## 2015-05-11 DIAGNOSIS — R197 Diarrhea, unspecified: Secondary | ICD-10-CM | POA: Insufficient documentation

## 2015-05-11 DIAGNOSIS — E1129 Type 2 diabetes mellitus with other diabetic kidney complication: Secondary | ICD-10-CM | POA: Insufficient documentation

## 2015-05-11 DIAGNOSIS — Z87891 Personal history of nicotine dependence: Secondary | ICD-10-CM | POA: Insufficient documentation

## 2015-05-11 DIAGNOSIS — G2581 Restless legs syndrome: Secondary | ICD-10-CM | POA: Insufficient documentation

## 2015-05-11 DIAGNOSIS — Z79899 Other long term (current) drug therapy: Secondary | ICD-10-CM | POA: Diagnosis not present

## 2015-05-11 DIAGNOSIS — E7800 Pure hypercholesterolemia, unspecified: Secondary | ICD-10-CM | POA: Insufficient documentation

## 2015-05-11 DIAGNOSIS — Z8673 Personal history of transient ischemic attack (TIA), and cerebral infarction without residual deficits: Secondary | ICD-10-CM | POA: Insufficient documentation

## 2015-05-11 DIAGNOSIS — Z992 Dependence on renal dialysis: Secondary | ICD-10-CM | POA: Diagnosis not present

## 2015-05-11 DIAGNOSIS — I120 Hypertensive chronic kidney disease with stage 5 chronic kidney disease or end stage renal disease: Secondary | ICD-10-CM | POA: Diagnosis not present

## 2015-05-11 DIAGNOSIS — N186 End stage renal disease: Secondary | ICD-10-CM | POA: Insufficient documentation

## 2015-05-11 DIAGNOSIS — K922 Gastrointestinal hemorrhage, unspecified: Secondary | ICD-10-CM | POA: Insufficient documentation

## 2015-05-11 LAB — CBC
HCT: 29.3 % — ABNORMAL LOW (ref 40.0–52.0)
HEMOGLOBIN: 9.9 g/dL — AB (ref 13.0–18.0)
MCH: 32.1 pg (ref 26.0–34.0)
MCHC: 33.7 g/dL (ref 32.0–36.0)
MCV: 95.3 fL (ref 80.0–100.0)
PLATELETS: 299 10*3/uL (ref 150–440)
RBC: 3.07 MIL/uL — AB (ref 4.40–5.90)
RDW: 15.2 % — ABNORMAL HIGH (ref 11.5–14.5)
WBC: 8.9 10*3/uL (ref 3.8–10.6)

## 2015-05-11 LAB — COMPREHENSIVE METABOLIC PANEL
ALK PHOS: 56 U/L (ref 38–126)
ALT: 17 U/L (ref 17–63)
ANION GAP: 14 (ref 5–15)
AST: 19 U/L (ref 15–41)
Albumin: 3.3 g/dL — ABNORMAL LOW (ref 3.5–5.0)
BILIRUBIN TOTAL: 0.6 mg/dL (ref 0.3–1.2)
BUN: 96 mg/dL — ABNORMAL HIGH (ref 6–20)
CALCIUM: 8.9 mg/dL (ref 8.9–10.3)
CO2: 20 mmol/L — ABNORMAL LOW (ref 22–32)
CREATININE: 9.53 mg/dL — AB (ref 0.61–1.24)
Chloride: 102 mmol/L (ref 101–111)
GFR, EST AFRICAN AMERICAN: 6 mL/min — AB (ref >60)
GFR, EST NON AFRICAN AMERICAN: 5 mL/min — AB (ref >60)
Glucose, Bld: 128 mg/dL — ABNORMAL HIGH (ref 65–99)
Potassium: 3.7 mmol/L (ref 3.5–5.1)
SODIUM: 136 mmol/L (ref 135–145)
TOTAL PROTEIN: 7.4 g/dL (ref 6.5–8.1)

## 2015-05-11 LAB — LIPASE, BLOOD: Lipase: 40 U/L (ref 11–51)

## 2015-05-11 NOTE — Discharge Instructions (Signed)
If you have lightheadedness, abdominal pain, vomiting, any bleeding from your bottom or black stool or you feel worse in any way return to the emergency department. Follow closely with your dialysis center tomorrow as a nephrologist wish to do dialysis. If you feel worse at all in the meantime return to see the emergency department.

## 2015-05-11 NOTE — ED Notes (Signed)
Pt comes into the ED via POV c/o diarrhea that started last Friday.  Patient is currently a dialysis patient and he has missed all his treatments so far this week due to the diarrhea.  Patient is supposed to receive treatments on MWF.  Patient's family called dialysis MD and they suggested he come here to be checked out. Patient in NAD at this time, denies any N/V.

## 2015-05-11 NOTE — ED Notes (Signed)
AAOx3.  Skin warm and dry.  NAD 

## 2015-05-11 NOTE — ED Provider Notes (Addendum)
[facility] Emergency Department Provider Note  ____________________________________________   I have reviewed the triage vital signs and the nursing notes.   HISTORY  Chief Complaint Diarrhea    HPI Troy Gallagher is a 70 y.o. male who is a Monday Wednesday Friday dialysis. He has had diarrhea over the last few days.-It started on Saturday or Sunday not Friday. He states that the diarrhea is actually getting better and today it is "thickening up". Nonetheless he did miss dialysis on Monday or Tuesday. He has had no abdominal pain no lightheadedness, no chest pain or shortness of breath, no other symptoms or complaints except for a nonbloody non melanotic diarrhea.Patient has had no recent antibiotics, no recent camping no recent travel to a third world country. Has had one diarrheal stool today which is actually much more solid than it had been before.    Past Medical History  Diagnosis Date  . Complication of anesthesia   . Hypertension   . Stroke ([place])   . GERD (gastroesophageal reflux disease)   . Headache   . Kidney dialysis status [id number]  . HLD (hyperlipidemia)   . Diabetes mellitus, type II ([place])   . Seizure ([place])   . BPH (benign prostatic hyperplasia)   . COPD (chronic obstructive pulmonary disease) ([place])   . PVD (peripheral vascular disease) ([place])   . Carotid stenosis   . Adenoma of large intestine   . ESRD on hemodialysis ([place])     MWF  . Anxiety   . RLS (restless legs syndrome)     Patient Active Problem List   Diagnosis Date Noted  . History of adenomatous polyp of colon 11/30/2014  . GIB (gastrointestinal bleeding) 11/10/2014  . TIA (transient ischemic attack) 10/04/2014  . Rotator cuff tear arthropathy of right shoulder 10/03/2014  . Generalized weakness 10/01/2014  . Elevated troponin 09/30/2014  . Weakness generalized 09/30/2014  . ESRD on hemodialysis ([place]) 09/30/2014  . GERD (gastroesophageal reflux disease) 09/30/2014  .  HTN (hypertension) 09/30/2014  . HLD (hyperlipidemia) 09/30/2014  . Type 2 diabetes mellitus ([place]) 09/30/2014  . Anxiety 09/30/2014  . RLS (restless legs syndrome) 09/30/2014  . History of CVA (cerebrovascular accident) 09/28/2014  . Symptoms, musculoskeletal, limb NEC 09/28/2014  . Anemia 09/28/2014  . Hyperlipidemia, mixed 09/28/2014  . Bilateral carotid artery stenosis 12/04/2011  . Depressive disorder 01/29/1998  . Barrett esophagus 01/29/1998    Past Surgical History  Procedure Laterality Date  . Shoulder arthroscopy w/ rotator cuff repair Left 1999  . Neck surgery N/A 10/24/2009    Cervical Diskectomy- C3-4, 4-5, 5-6 anterior disckectomy by Dr. Arnoldo Morale at [facility]  . Av fistula placement Left 2 years  . Colonoscopy N/A 06/14/2014    Procedure: COLONOSCOPY;  Surgeon: Hulen Luster, MD;  Location: [facility] ENDOSCOPY;  Service: Gastroenterology;  Laterality: N/A;  . Carotid endarterectomy Right 06/05/2013    Dr. Delana Meyer  . Tonsillectomy and adenoidectomy  1950  . 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 Repeat in 3 years  . Carotid doppler ultrasound  04/22/2013    85-90% stenosis of the RICA, 60-70% stenosis of left cmmon carotid and origin of left subclavian  . Doppler echocardiography  12/04/2011    Bilateral stenosis 80%  . Arch aortogram  04/07/2013    85-90% Stenosis origin of right ICA. 65-70% narrowing of left CCA. 50-60% narrowing of left subclavian artery  . Colonoscopy with propofol N/A 11/11/2014  Procedure: COLONOSCOPY WITH PROPOFOL;  Surgeon: Hulen Luster, MD;  Location: [facility] ENDOSCOPY;  Service: Endoscopy;  Laterality: N/A;  . Esophagogastroduodenoscopy (egd) with propofol N/A 11/11/2014    Procedure: ESOPHAGOGASTRODUODENOSCOPY (EGD) WITH PROPOFOL;  Surgeon: Hulen Luster, MD;  Location: [facility] ENDOSCOPY;  Service: Endoscopy;  Laterality: N/A;  . Peripheral vascular catheterization N/A 04/26/2015    Procedure: A/V  Shuntogram/Fistulagram;  Surgeon: Katha Cabal, MD;  Location: [place] CV LAB;  Service: Cardiovascular;  Laterality: N/A;  . Peripheral vascular catheterization N/A 04/26/2015    Procedure: A/V Shunt Intervention;  Surgeon: Katha Cabal, MD;  Location: [place] CV LAB;  Service: Cardiovascular;  Laterality: N/A;    Current Outpatient Rx  Name  Route  Sig  Dispense  Refill  . acetaminophen (TYLENOL) 325 MG tablet   Oral   Take 650 mg by mouth every 6 (six) hours as needed for mild pain or headache.         . albuterol-ipratropium (COMBIVENT) 18-103 MCG/ACT inhaler   Inhalation   Inhale 2 puffs into the lungs every 4 (four) hours as needed for wheezing or shortness of breath.         . allopurinol (ZYLOPRIM) 100 MG tablet   Oral   Take 100 mg by mouth every evening.          Marland Kitchen ALPRAZolam (XANAX) 0.5 MG tablet   Oral   Take 1 tablet (0.5 mg total) by mouth at bedtime.   180 tablet   1   . amLODipine (NORVASC) 10 MG tablet   Oral   Take 10 mg by mouth daily. Pt does not take on dialysis days.         Marland Kitchen atorvastatin (LIPITOR) 40 MG tablet   Oral   Take 40 mg by mouth every evening.          Marland Kitchen azelastine (ASTELIN) 0.1 % nasal spray   Each Nare   Place 2 sprays into both nostrils daily as needed for rhinitis.          . cinacalcet (SENSIPAR) 30 MG tablet   Oral   Take 30 mg by mouth every evening.         . cycloSPORINE (RESTASIS) 0.05 % ophthalmic emulsion   Both Eyes   Place 1 drop into both eyes 2 (two) times daily.   0.4 mL   3   . diphenhydrAMINE (BENADRYL) 25 MG tablet   Oral   Take 25 mg by mouth at bedtime as needed for itching or sleep.         Marland Kitchen esomeprazole (NEXIUM) 40 MG capsule   Oral   Take 1 capsule (40 mg total) by mouth 2 (two) times daily.   60 capsule   6   . ferrous sulfate 325 (65 FE) MG tablet   Oral   Take 1 tablet (325 mg total) by mouth 2 (two) times daily with a meal.   60 tablet   3   . FLUoxetine  (PROZAC) 20 MG tablet   Oral   Take 1 tablet (20 mg total) by mouth daily.   30 tablet   5   . furosemide (LASIX) 80 MG tablet   Oral   Take 80 mg by mouth daily. Pt does not take on dialysis days.         Marland Kitchen lidocaine-prilocaine (EMLA) cream   Topical   Apply 1 application topically as needed (before port access).          Marland Kitchen  loratadine (CLARITIN) 10 MG tablet   Oral   Take 10 mg by mouth daily as needed for allergies.          Marland Kitchen losartan (COZAAR) 25 MG tablet   Oral   Take 25 mg by mouth every evening.         . meclizine (ANTIVERT) 25 MG tablet   Oral   Take 25 mg by mouth 2 (two) times daily as needed for dizziness.         Marland Kitchen oxyCODONE (OXY IR/ROXICODONE) 5 MG immediate release tablet   Oral   Take 1 tablet (5 mg total) by mouth every 4 (four) hours as needed for severe pain.   20 tablet   0   . polyvinyl alcohol (LIQUIFILM TEARS) 1.4 % ophthalmic solution   Both Eyes   Place 1 drop into both eyes as needed for dry eyes.   15 mL   0   . potassium chloride SA (K-DUR,KLOR-CON) 20 MEQ tablet   Oral   Take 20 mEq by mouth daily.         Marland Kitchen rOPINIRole (REQUIP) 2 MG tablet   Oral   Take 2 mg by mouth at bedtime.         . sevelamer carbonate (RENVELA) 800 MG tablet   Oral   Take 2 tablets (1,600 mg total) by mouth 3 (three) times daily with meals.   90 tablet   6   . traMADol-acetaminophen (ULTRACET) 37.5-325 MG tablet   Oral   Take 1 tablet by mouth every 6 (six) hours as needed for moderate pain.   40 tablet   0     Allergies Sulfur  Family History  Problem Relation Age of Onset  . Stroke Mother   . Prostate cancer Father   . Heart disease Sister     Social History Social History  Substance Use Topics  . Smoking status: Former Smoker -- 2.00 packs/day for 30 years    Types: Cigarettes    Quit date: 01/29/2002  . Smokeless tobacco: Current User  . Alcohol Use: No    Review of Systems Constitutional: No fever/chills Eyes: No  visual changes. ENT: No sore throat. No stiff neck no neck pain Cardiovascular: Denies chest pain. Respiratory: Denies shortness of breath. Gastrointestinal:   no vomiting.  + diarrhea.  No constipation. Genitourinary: Negative for dysuria. Musculoskeletal: Negative lower extremity swelling Skin: Negative for rash. Neurological: Negative for headaches, focal weakness or numbness. 10-point ROS otherwise negative.  ____________________________________________   PHYSICAL EXAM:  VITAL SIGNS: ED Triage Vitals  Enc Vitals Group     BP 05/11/15 1321 153/50 mmHg     Pulse Rate 05/11/15 1321 65     Resp 05/11/15 1321 18     Temp 05/11/15 1321 98.9 F (37.2 C)     Temp Source 05/11/15 1321 Oral     SpO2 05/11/15 1321 93 %     Weight 05/11/15 1321 200 lb (90.719 kg)     Height 05/11/15 1321 5\' 9"  (1.753 m)     Head Cir --      Peak Flow --      Pain Score --      Pain Loc --      Pain Edu? --      Excl. in [place]? --     Constitutional: Alert and oriented. Well appearing and in no acute distress. Eyes: Conjunctivae are normal. PERRL. EOMI. Head: Atraumatic. Nose: No congestion/rhinnorhea. Mouth/Throat: Mucous membranes are moist.  Oropharynx non-erythematous. Neck: No stridor.   Nontender with no meningismus Cardiovascular: Normal rate, regular rhythm. Grossly normal heart sounds.  Good peripheral circulation. Respiratory: Normal respiratory effort.  No retractions. Lungs CTAB. Abdominal: Soft and nontender. No distention. No guarding no rebound Rectal exam: Guaiac negative brown stool Back:  There is no focal tenderness or step off there is no midline tenderness there are no lesions noted. there is no CVA tenderness Musculoskeletal: No lower extremity tenderness. No joint effusions, no DVT signs strong distal pulses no edema Neurologic:  Normal speech and language. No gross focal neurologic deficits are appreciated.  Skin:  Skin is warm, dry and intact. No rash noted. Psychiatric:  Mood and affect are normal. Speech and behavior are normal.  ____________________________________________   LABS (all labs ordered are listed, but only abnormal results are displayed)  Labs Reviewed  COMPREHENSIVE METABOLIC PANEL - Abnormal; Notable for the following:    CO2 20 (*)    Glucose, Bld 128 (*)    BUN 96 (*)    Creatinine, Ser 9.53 (*)    Albumin 3.3 (*)    GFR calc non Af Amer 5 (*)    GFR calc Af Amer 6 (*)    All other components within normal limits  CBC - Abnormal; Notable for the following:    RBC 3.07 (*)    Hemoglobin 9.9 (*)    HCT 29.3 (*)    RDW 15.2 (*)    All other components within normal limits  LIPASE, BLOOD   ____________________________________________  EKG  I personally interpreted any EKGs ordered by me or triage Normal sinus rhythm at 62 bpm no acute ST elevation or acute ST depression normal axis unremarkable EKG ____________________________________________  RADIOLOGY  I reviewed any imaging ordered by me or triage that were performed during my shift and, if possible, patient and/or family made aware of any abnormal findings. ____________________________________________   PROCEDURES  Procedure(s) performed: None  Critical Care performed: None  ____________________________________________   INITIAL IMPRESSION / ASSESSMENT AND PLAN / ED COURSE  Pertinent labs & imaging results that were available during my care of the patient were reviewed by me and considered in my medical decision making (see chart for details).  Patient missed dialysis because of diarrhea however the diarrhea is rapidly improving according to patient and family. His vital signs are reassuring, EKG is reassuring, he is not bleeding, blood work shows no evidence of hyperkalemia, his lungs are clear and he has no evidence of acute pulmonary edema. I will talk to his nephrologist about what they would like to do.  ----------------------------------------- 2:51 PM  on 05/11/2015 -----------------------------------------  The patient has no evidence of a need for acute dialysis. I did talk to Dr. Holley Raring, and he states that there is no need for admission and I agree. Nor is there any indication for acute dialysis and I agree. Patient will follow up closely as an outpatient with them tomorrow for dialysis. They do not wish to start Kayexalate or any other anti-potassium medication given his K. I've given the patient and family extensive return precautions and follow-up. Hemoglobin is stable for this patient and he is guaiac negative. No evidence of C. difficile and his symptoms are improving rapidly. ____________________________________________   FINAL CLINICAL IMPRESSION(S) / ED DIAGNOSES  Final diagnoses:  None      This chart was dictated using voice recognition software.  Despite best efforts to proofread,  errors can occur which can change meaning.  Schuyler Amor, MD 05/11/15 [place], MD 05/11/15 [place], MD 05/11/15 1524

## 2015-06-06 ENCOUNTER — Encounter: Payer: Self-pay | Admitting: Family Medicine

## 2015-06-06 ENCOUNTER — Ambulatory Visit
Admission: RE | Admit: 2015-06-06 | Discharge: 2015-06-06 | Disposition: A | Discharge status: Home / Self Care | Payer: Medicare Other | Source: Ambulatory Visit | Attending: Family Medicine | Admitting: Family Medicine

## 2015-06-06 ENCOUNTER — Ambulatory Visit (INDEPENDENT_AMBULATORY_CARE_PROVIDER_SITE_OTHER): Payer: Medicare Other | Admitting: Family Medicine

## 2015-06-06 VITALS — BP 152/62 | HR 59 | Temp 99.1°F | Resp 18

## 2015-06-06 DIAGNOSIS — R109 Unspecified abdominal pain: Secondary | ICD-10-CM | POA: Diagnosis not present

## 2015-06-06 DIAGNOSIS — R319 Hematuria, unspecified: Secondary | ICD-10-CM

## 2015-06-06 DIAGNOSIS — N390 Urinary tract infection, site not specified: Secondary | ICD-10-CM | POA: Diagnosis present

## 2015-06-06 DIAGNOSIS — R197 Diarrhea, unspecified: Secondary | ICD-10-CM | POA: Diagnosis not present

## 2015-06-06 LAB — POCT URINALYSIS DIPSTICK
Glucose, UA: 100
Ketones, UA: NEGATIVE
Nitrite, UA: NEGATIVE
PH UA: 7.5
Protein, UA: 2000
SPEC GRAV UA: 1.015
Urobilinogen, UA: 0.2

## 2015-06-06 NOTE — Progress Notes (Signed)
Patient: Troy Gallagher Male    DOB: 11/22/1945   70 y.o.   MRN: [id number] Visit Date: 06/06/2015  Today's Provider: Lelon Huh, MD   Chief Complaint  Patient presents with  . Diarrhea   Subjective:    Diarrhea  This is a recurrent problem. Episode onset: 1 month ago. Progression since onset: symtoms resolved after being seen in the ER a few weeks ago then restarted today. The stool consistency is described as watery. Associated symptoms include abdominal pain, chills, headaches, increased flatus and vomiting. Pertinent negatives include no arthralgias, bloating, coughing, fever or URI. Treatments tried: Kaopectac and Lomotil. The treatment provided no relief.  He had 3-4 days of diarrhea in early April causing him to miss dialysis. He went to ED on 05/11/15, but diarrhea was resolving own its on at that time, and he required no treatment. He had diarrhea again yesterday and was unable to make it to the bathroom., and was unable to make it to dialysis.  He took Imodium, and has not had BM since. Has had mild cramping of lower abdomen. He was a little nauseated yesterday, one episode of vomiting. . No fevers or chills.      Allergies  Allergen Reactions  . Sulfur Hives   Previous Medications   ACETAMINOPHEN (TYLENOL) 325 MG TABLET    Take 650 mg by mouth every 6 (six) hours as needed for mild pain or headache.   ALBUTEROL-IPRATROPIUM (COMBIVENT) 18-103 MCG/ACT INHALER    Inhale 2 puffs into the lungs every 4 (four) hours as needed for wheezing or shortness of breath.   ALLOPURINOL (ZYLOPRIM) 100 MG TABLET    Take 100 mg by mouth every evening.    ALPRAZOLAM (XANAX) 0.5 MG TABLET    Take 1 tablet (0.5 mg total) by mouth at bedtime.   AMLODIPINE (NORVASC) 10 MG TABLET    Take 10 mg by mouth daily. Pt does not take on dialysis days.   ATORVASTATIN (LIPITOR) 40 MG TABLET    Take 40 mg by mouth every evening.    AZELASTINE (ASTELIN) 0.1 % NASAL SPRAY    Place 2 sprays into both nostrils  daily as needed for rhinitis.    CINACALCET (SENSIPAR) 30 MG TABLET    Take 30 mg by mouth every evening.   CYCLOSPORINE (RESTASIS) 0.05 % OPHTHALMIC EMULSION    Place 1 drop into both eyes 2 (two) times daily.   DIPHENHYDRAMINE (BENADRYL) 25 MG TABLET    Take 25 mg by mouth at bedtime as needed for itching or sleep.   ESOMEPRAZOLE (NEXIUM) 40 MG CAPSULE    Take 1 capsule (40 mg total) by mouth 2 (two) times daily.   FERROUS SULFATE 325 (65 FE) MG TABLET    Take 1 tablet (325 mg total) by mouth 2 (two) times daily with a meal.   FLUOXETINE (PROZAC) 20 MG TABLET    Take 1 tablet (20 mg total) by mouth daily.   FUROSEMIDE (LASIX) 80 MG TABLET    Take 80 mg by mouth daily. Pt does not take on dialysis days.   LIDOCAINE-PRILOCAINE (EMLA) CREAM    Apply 1 application topically as needed (before port access).    LORATADINE (CLARITIN) 10 MG TABLET    Take 10 mg by mouth daily as needed for allergies.    LOSARTAN (COZAAR) 25 MG TABLET    Take 25 mg by mouth every evening.   MECLIZINE (ANTIVERT) 25 MG TABLET    Take  25 mg by mouth 2 (two) times daily as needed for dizziness.   OXYCODONE (OXY IR/ROXICODONE) 5 MG IMMEDIATE RELEASE TABLET    Take 1 tablet (5 mg total) by mouth every 4 (four) hours as needed for severe pain.   POLYVINYL ALCOHOL (LIQUIFILM TEARS) 1.4 % OPHTHALMIC SOLUTION    Place 1 drop into both eyes as needed for dry eyes.   POTASSIUM CHLORIDE SA (K-DUR,KLOR-CON) 20 MEQ TABLET    Take 20 mEq by mouth daily.   ROPINIROLE (REQUIP) 2 MG TABLET    Take 2 mg by mouth at bedtime.   SEVELAMER CARBONATE (RENVELA) 800 MG TABLET    Take 2 tablets (1,600 mg total) by mouth 3 (three) times daily with meals.   TRAMADOL-ACETAMINOPHEN (ULTRACET) 37.5-325 MG TABLET    Take 1 tablet by mouth every 6 (six) hours as needed for moderate pain.    Review of Systems  Constitutional: Positive for chills. Negative for fever, appetite change and fatigue.  Respiratory: Negative for cough, chest tightness, shortness  of breath and wheezing.   Cardiovascular: Negative for chest pain and palpitations.  Gastrointestinal: Positive for nausea, vomiting, abdominal pain, diarrhea and flatus. Negative for constipation, blood in stool, abdominal distention, anal bleeding and bloating.  Musculoskeletal: Positive for back pain. Negative for arthralgias.  Neurological: Positive for headaches.    Social History  Substance Use Topics  . Smoking status: Former Smoker -- 2.00 packs/day for 30 years    Types: Cigarettes    Quit date: 01/29/2002  . Smokeless tobacco: Current User  . Alcohol Use: No   Objective:   BP 152/62 mmHg  Pulse 59  Temp(Src) 99.1 F (37.3 C) (Oral)  Resp 18  SpO2 95%  Physical Exam  General Appearance:    Alert, cooperative, no distress  Eyes:    PERRL, conjunctiva/corneas clear, EOM's intact       Lungs:     Clear to auscultation bilaterally, respirations unlabored  Heart:    Regular rate and rhythm  Abdomen:   bowel sounds present and normal in all 4 quadrants. No CVA tenderness      Results for orders placed or performed in visit on 06/06/15  Urine Culture  Result Value Ref Range   Urine Culture, Routine Final report    Urine Culture result 1 No growth   POCT urinalysis dipstick  Result Value Ref Range   Color, UA yellow    Clarity, UA clear    Glucose, UA 100    Bilirubin, UA small    Ketones, UA negative    Spec Grav, UA 1.015    Blood, UA moderate (hemolyzed)    pH, UA 7.5    Protein, UA 2000 ++++    Urobilinogen, UA 0.2    Nitrite, UA negative    Leukocytes, UA Trace (A) Negative        Assessment & Plan:     1. Diarrhea, unspecified type Resolved today. Check XR rule out ileus - DG Abd 2 Views; Future  2. Hematuria, site unspecified  - POCT urinalysis dipstick - Urine Culture       Lelon Huh, MD  [facility]

## 2015-06-09 LAB — URINE CULTURE: Organism ID, Bacteria: NO GROWTH

## 2015-07-25 ENCOUNTER — Telehealth: Payer: Self-pay | Admitting: Family Medicine

## 2015-07-25 NOTE — Telephone Encounter (Signed)
This is a Risk manager patient. Should this message wait until Dr. Caryn Section return.

## 2015-07-25 NOTE — Telephone Encounter (Signed)
Tried calling patients daughter Amy. No answer. Left message to call back.

## 2015-07-25 NOTE — Telephone Encounter (Signed)
We can right rx if they would like and they can start process.  May need OV for insurance approval. Hard to say. Thanks.

## 2015-07-25 NOTE — Telephone Encounter (Signed)
Pt's daughter Amy called stating that pt fell out of bed this past weekend.She is stating that paramedics suggest that pt need to have a hospital bed and a bar to help lift him out of bed.Call back # [phone]

## 2015-07-26 ENCOUNTER — Emergency Department
Admission: EM | Admit: 2015-07-26 | Discharge: 2015-07-26 | Disposition: A | Discharge status: Home / Self Care | Payer: Medicare Other | Attending: Emergency Medicine | Admitting: Emergency Medicine

## 2015-07-26 ENCOUNTER — Emergency Department: Payer: Medicare Other

## 2015-07-26 ENCOUNTER — Other Ambulatory Visit: Payer: Self-pay

## 2015-07-26 DIAGNOSIS — N186 End stage renal disease: Secondary | ICD-10-CM | POA: Insufficient documentation

## 2015-07-26 DIAGNOSIS — Z8669 Personal history of other diseases of the nervous system and sense organs: Secondary | ICD-10-CM | POA: Diagnosis not present

## 2015-07-26 DIAGNOSIS — E785 Hyperlipidemia, unspecified: Secondary | ICD-10-CM | POA: Insufficient documentation

## 2015-07-26 DIAGNOSIS — Z992 Dependence on renal dialysis: Secondary | ICD-10-CM | POA: Insufficient documentation

## 2015-07-26 DIAGNOSIS — Z8673 Personal history of transient ischemic attack (TIA), and cerebral infarction without residual deficits: Secondary | ICD-10-CM | POA: Diagnosis not present

## 2015-07-26 DIAGNOSIS — Z86718 Personal history of other venous thrombosis and embolism: Secondary | ICD-10-CM | POA: Diagnosis not present

## 2015-07-26 DIAGNOSIS — F329 Major depressive disorder, single episode, unspecified: Secondary | ICD-10-CM | POA: Diagnosis not present

## 2015-07-26 DIAGNOSIS — E1122 Type 2 diabetes mellitus with diabetic chronic kidney disease: Secondary | ICD-10-CM | POA: Insufficient documentation

## 2015-07-26 DIAGNOSIS — Z87891 Personal history of nicotine dependence: Secondary | ICD-10-CM | POA: Insufficient documentation

## 2015-07-26 DIAGNOSIS — J449 Chronic obstructive pulmonary disease, unspecified: Secondary | ICD-10-CM | POA: Diagnosis not present

## 2015-07-26 DIAGNOSIS — Z79899 Other long term (current) drug therapy: Secondary | ICD-10-CM | POA: Insufficient documentation

## 2015-07-26 DIAGNOSIS — R531 Weakness: Secondary | ICD-10-CM

## 2015-07-26 DIAGNOSIS — I120 Hypertensive chronic kidney disease with stage 5 chronic kidney disease or end stage renal disease: Secondary | ICD-10-CM | POA: Diagnosis not present

## 2015-07-26 LAB — BASIC METABOLIC PANEL
Anion gap: 13 (ref 5–15)
BUN: 22 mg/dL — AB (ref 6–20)
CALCIUM: 9.2 mg/dL (ref 8.9–10.3)
CO2: 27 mmol/L (ref 22–32)
CREATININE: 4.29 mg/dL — AB (ref 0.61–1.24)
Chloride: 96 mmol/L — ABNORMAL LOW (ref 101–111)
GFR calc Af Amer: 15 mL/min — ABNORMAL LOW (ref >60)
GFR, EST NON AFRICAN AMERICAN: 13 mL/min — AB (ref >60)
GLUCOSE: 93 mg/dL (ref 65–99)
Potassium: 2.9 mmol/L — ABNORMAL LOW (ref 3.5–5.1)
SODIUM: 136 mmol/L (ref 135–145)

## 2015-07-26 LAB — URINALYSIS COMPLETE WITH MICROSCOPIC (ARMC ONLY)
BILIRUBIN URINE: NEGATIVE
Glucose, UA: 100 mg/dL — AB
HGB URINE DIPSTICK: NEGATIVE
KETONES UR: NEGATIVE mg/dL
Leukocytes, UA: NEGATIVE
NITRITE: NEGATIVE
Protein, ur: >300 mg/dL — AB
SPECIFIC GRAVITY, URINE: 1.01 (ref 1.005–1.030)
Squamous Epithelial / LPF: NONE SEEN
pH: 8 (ref 5.0–8.0)

## 2015-07-26 LAB — CBC
HEMATOCRIT: 28.2 % — AB (ref 40.0–52.0)
Hemoglobin: 9.9 g/dL — ABNORMAL LOW (ref 13.0–18.0)
MCH: 33 pg (ref 26.0–34.0)
MCHC: 35 g/dL (ref 32.0–36.0)
MCV: 94.3 fL (ref 80.0–100.0)
PLATELETS: 263 10*3/uL (ref 150–440)
RBC: 2.99 MIL/uL — ABNORMAL LOW (ref 4.40–5.90)
RDW: 15.3 % — AB (ref 11.5–14.5)
WBC: 6.6 10*3/uL (ref 3.8–10.6)

## 2015-07-26 NOTE — Discharge Instructions (Signed)

## 2015-07-26 NOTE — ED Provider Notes (Signed)
[facility] Emergency Department Provider Note  ____________________________________________  Time seen: 1:10 AM  I have reviewed the triage vital signs and the nursing notes.   HISTORY  Chief Complaint Weakness and Fall    HPI Troy Gallagher is a 70 y.o. male with chronic generalized weakness that is particularly pronounced after dialysis who had his usual dialysis session today. This session was uncomplicated and he had the full treatment without any ill effects. Upon returning home, he was too weak to get up into his bed. He fell down on the floor, which is typical for him. EMS report that they are frequently going out to the house to help the patient up from the floor but today there is a strong smell of urine and they're concerned that he might have a fever although there is no thermometer wasn't working, so they brought him to the emergency department. Patient denies any acute complaints. He agrees that he is very weak, reports he has a chronic cough which is unchanged and nonproductive. No fever or chills. No sweats. No chest pain or shortness of breath.     Past Medical History  Diagnosis Date  . Complication of anesthesia   . Hypertension   . Stroke ([place])   . GERD (gastroesophageal reflux disease)   . Headache   . Kidney dialysis status [id number]  . HLD (hyperlipidemia)   . Diabetes mellitus, type II ([place])   . Seizure ([place])   . BPH (benign prostatic hyperplasia)   . COPD (chronic obstructive pulmonary disease) ([place])   . PVD (peripheral vascular disease) ([place])   . Carotid stenosis   . Adenoma of large intestine   . ESRD on hemodialysis ([place])     MWF  . Anxiety   . RLS (restless legs syndrome)      Patient Active Problem List   Diagnosis Date Noted  . History of adenomatous polyp of colon 11/30/2014  . GIB (gastrointestinal bleeding) 11/10/2014  . TIA (transient ischemic attack) 10/04/2014  . Rotator cuff tear arthropathy of right shoulder  10/03/2014  . Generalized weakness 10/01/2014  . Elevated troponin 09/30/2014  . Weakness generalized 09/30/2014  . ESRD on hemodialysis ([place]) 09/30/2014  . GERD (gastroesophageal reflux disease) 09/30/2014  . HTN (hypertension) 09/30/2014  . HLD (hyperlipidemia) 09/30/2014  . Type 2 diabetes mellitus ([place]) 09/30/2014  . Anxiety 09/30/2014  . RLS (restless legs syndrome) 09/30/2014  . History of CVA (cerebrovascular accident) 09/28/2014  . Symptoms, musculoskeletal, limb NEC 09/28/2014  . Anemia 09/28/2014  . Hyperlipidemia, mixed 09/28/2014  . Bilateral carotid artery stenosis 12/04/2011  . Depressive disorder 01/29/1998  . Barrett esophagus 01/29/1998     Past Surgical History  Procedure Laterality Date  . Shoulder arthroscopy w/ rotator cuff repair Left 1999  . Neck surgery N/A 10/24/2009    Cervical Diskectomy- C3-4, 4-5, 5-6 anterior disckectomy by Dr. Arnoldo Morale at [facility]  . Av fistula placement Left 2 years  . Colonoscopy N/A 06/14/2014    Procedure: COLONOSCOPY;  Surgeon: Hulen Luster, MD;  Location: [facility] ENDOSCOPY;  Service: Gastroenterology;  Laterality: N/A;  . Carotid endarterectomy Right 06/05/2013    Dr. Delana Meyer  . Tonsillectomy and adenoidectomy  1950  . 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 Repeat in 3 years  . Carotid doppler ultrasound  04/22/2013    85-90% stenosis of the RICA, 60-70% stenosis of left cmmon carotid and origin of left subclavian  .  Doppler echocardiography  12/04/2011    Bilateral stenosis 80%  . Arch aortogram  04/07/2013    85-90% Stenosis origin of right ICA. 65-70% narrowing of left CCA. 50-60% narrowing of left subclavian artery  . Colonoscopy with propofol N/A 11/11/2014    Procedure: COLONOSCOPY WITH PROPOFOL;  Surgeon: Hulen Luster, MD;  Location: [facility] ENDOSCOPY;  Service: Endoscopy;  Laterality: N/A;  . Esophagogastroduodenoscopy (egd) with propofol N/A 11/11/2014     Procedure: ESOPHAGOGASTRODUODENOSCOPY (EGD) WITH PROPOFOL;  Surgeon: Hulen Luster, MD;  Location: [facility] ENDOSCOPY;  Service: Endoscopy;  Laterality: N/A;  . Peripheral vascular catheterization N/A 04/26/2015    Procedure: A/V Shuntogram/Fistulagram;  Surgeon: Katha Cabal, MD;  Location: [place] CV LAB;  Service: Cardiovascular;  Laterality: N/A;  . Peripheral vascular catheterization N/A 04/26/2015    Procedure: A/V Shunt Intervention;  Surgeon: Katha Cabal, MD;  Location: [place] CV LAB;  Service: Cardiovascular;  Laterality: N/A;     Current Outpatient Rx  Name  Route  Sig  Dispense  Refill  . ALPRAZolam (XANAX) 0.5 MG tablet   Oral   Take 1 tablet (0.5 mg total) by mouth at bedtime.   180 tablet   1   . cycloSPORINE (RESTASIS) 0.05 % ophthalmic emulsion   Both Eyes   Place 1 drop into both eyes 2 (two) times daily.   0.4 mL   3   . esomeprazole (NEXIUM) 40 MG capsule   Oral   Take 1 capsule (40 mg total) by mouth 2 (two) times daily.   60 capsule   6   . acetaminophen (TYLENOL) 325 MG tablet   Oral   Take 650 mg by mouth every 6 (six) hours as needed for mild pain or headache.         . albuterol-ipratropium (COMBIVENT) 18-103 MCG/ACT inhaler   Inhalation   Inhale 2 puffs into the lungs every 4 (four) hours as needed for wheezing or shortness of breath.         . allopurinol (ZYLOPRIM) 100 MG tablet   Oral   Take 100 mg by mouth every evening.          Marland Kitchen amLODipine (NORVASC) 10 MG tablet   Oral   Take 10 mg by mouth daily. Pt does not take on dialysis days.         Marland Kitchen atorvastatin (LIPITOR) 40 MG tablet   Oral   Take 40 mg by mouth every evening.          Marland Kitchen azelastine (ASTELIN) 0.1 % nasal spray   Each Nare   Place 2 sprays into both nostrils daily as needed for rhinitis.          . cinacalcet (SENSIPAR) 30 MG tablet   Oral   Take 30 mg by mouth every evening.         . diphenhydrAMINE (BENADRYL) 25 MG tablet   Oral   Take 25  mg by mouth at bedtime as needed for itching or sleep.         . ferrous sulfate 325 (65 FE) MG tablet   Oral   Take 1 tablet (325 mg total) by mouth 2 (two) times daily with a meal.   60 tablet   3   . FLUoxetine (PROZAC) 20 MG capsule   Oral   Take 20 mg by mouth daily.      5   . furosemide (LASIX) 80 MG tablet   Oral   Take 80 mg  by mouth daily. Pt does not take on dialysis days.         Marland Kitchen labetalol (NORMODYNE) 100 MG tablet   Oral   Take 100 mg by mouth 2 (two) times daily.         Marland Kitchen lidocaine-prilocaine (EMLA) cream   Topical   Apply 1 application topically as needed (before port access).          Marland Kitchen loratadine (CLARITIN) 10 MG tablet   Oral   Take 10 mg by mouth daily as needed for allergies.          Marland Kitchen losartan (COZAAR) 25 MG tablet   Oral   Take 25 mg by mouth every evening.         . meclizine (ANTIVERT) 25 MG tablet   Oral   Take 25 mg by mouth 2 (two) times daily as needed for dizziness.         Marland Kitchen oxyCODONE (OXY IR/ROXICODONE) 5 MG immediate release tablet   Oral   Take 1 tablet (5 mg total) by mouth every 4 (four) hours as needed for severe pain.   20 tablet   0   . polyvinyl alcohol (LIQUIFILM TEARS) 1.4 % ophthalmic solution   Both Eyes   Place 1 drop into both eyes as needed for dry eyes.   15 mL   0   . potassium chloride SA (K-DUR,KLOR-CON) 20 MEQ tablet   Oral   Take 20 mEq by mouth daily.         Marland Kitchen rOPINIRole (REQUIP) 2 MG tablet   Oral   Take 2 mg by mouth at bedtime.         . sevelamer carbonate (RENVELA) 800 MG tablet   Oral   Take 2 tablets (1,600 mg total) by mouth 3 (three) times daily with meals.   90 tablet   6   . traMADol-acetaminophen (ULTRACET) 37.5-325 MG tablet   Oral   Take 1 tablet by mouth every 6 (six) hours as needed for moderate pain.   40 tablet   0      Allergies Sulfur   Family History  Problem Relation Age of Onset  . Stroke Mother   . Prostate cancer Father   . Heart disease  Sister     Social History Social History  Substance Use Topics  . Smoking status: Former Smoker -- 2.00 packs/day for 30 years    Types: Cigarettes    Quit date: 01/29/2002  . Smokeless tobacco: Current User  . Alcohol Use: No    Review of Systems  Constitutional:   No fever or chills. Positive generalized weakness ENT:   No sore throat. No rhinorrhea. Cardiovascular:   No chest pain. Respiratory:   No dyspnea , positive chronic cough. Gastrointestinal:   Negative for abdominal pain, vomiting and diarrhea.  Genitourinary:   Negative for dysuria or difficulty urinating. Musculoskeletal:   Negative for focal pain or swelling Neurological:   Negative for headaches 10-point ROS otherwise negative.  ____________________________________________   PHYSICAL EXAM:  VITAL SIGNS: ED Triage Vitals  Enc Vitals Group     BP 07/26/15 0010 194/70 mmHg     Pulse Rate 07/26/15 0010 62     Resp 07/26/15 0010 21     Temp 07/26/15 0010 98.7 F (37.1 C)     Temp Source 07/26/15 0010 Oral     SpO2 07/26/15 0010 98 %     Weight 07/26/15 0010 210 lb (95.255 kg)  Height 07/26/15 0010 5\' 6"  (1.676 m)     Head Cir --      Peak Flow --      Pain Score 07/26/15 0013 0     Pain Loc --      Pain Edu? --      Excl. in [place]? --     Vital signs reviewed, nursing assessments reviewed.   Constitutional:   Alert and oriented. Well appearing and in no distress. Eyes:   No scleral icterus. No conjunctival pallor. PERRL. EOMI.  No nystagmus. ENT   Head:   Normocephalic and atraumatic.   Nose:   No congestion/rhinnorhea. No septal hematoma   Mouth/Throat:   MMM, no pharyngeal erythema. No peritonsillar mass.    Neck:   No stridor. No SubQ emphysema. No meningismus. Hematological/Lymphatic/Immunilogical:   No cervical lymphadenopathy. Cardiovascular:   RRR. Symmetric bilateral radial and DP pulses.  No murmurs. Left upper extremity AV fistula hemostatic, no inflammatory changes, strong  thrill Respiratory:   Normal respiratory effort without tachypnea nor retractions. Breath sounds are clear and equal bilaterally. No wheezes/rales/rhonchi. Gastrointestinal:   Soft and nontender. Non distended. There is no CVA tenderness.  No rebound, rigidity, or guarding. Genitourinary:   deferred Musculoskeletal:   Nontender with normal range of motion in all extremities. No joint effusions.  No lower extremity tenderness.  No edema. Neurologic:   Normal speech and language.  CN 2-10 normal. Motor grossly intact. No gross focal neurologic deficits are appreciated.  Skin:    Skin is warm, dry and intact. No rash noted.  No petechiae, purpura, or bullae.  ____________________________________________    LABS (pertinent positives/negatives) (all labs ordered are listed, but only abnormal results are displayed) Labs Reviewed  BASIC METABOLIC PANEL - Abnormal; Notable for the following:    Potassium 2.9 (*)    Chloride 96 (*)    BUN 22 (*)    Creatinine, Ser 4.29 (*)    GFR calc non Af Amer 13 (*)    GFR calc Af Amer 15 (*)    All other components within normal limits  CBC - Abnormal; Notable for the following:    RBC 2.99 (*)    Hemoglobin 9.9 (*)    HCT 28.2 (*)    RDW 15.3 (*)    All other components within normal limits  URINALYSIS COMPLETEWITH MICROSCOPIC ([facility] ONLY) - Abnormal; Notable for the following:    Color, Urine YELLOW (*)    APPearance CLEAR (*)    Glucose, UA 100 (*)    Protein, ur >300 (*)    Bacteria, UA RARE (*)    All other components within normal limits  CBG MONITORING, ED   ____________________________________________   EKG  Interpreted by me Sinus rhythm rate of 61, normal axis intervals QRS ST segments and T waves.  ____________________________________________    RADIOLOGY  Chest x-ray unremarkable. Chronic bronchitic  changes.  ____________________________________________   PROCEDURES   ____________________________________________   INITIAL IMPRESSION / ASSESSMENT AND PLAN / ED COURSE  Pertinent labs & imaging results that were available during my care of the patient were reviewed by me and considered in my medical decision making (see chart for details).  Patient well appearing no acute distress. Brought to ED for concern of febrile illness possible UTI, lab workup is entirely negative. Urinalysis clear, vital signs unremarkable except for chronic stable hypertension. Chemistry panel does show mild hypokalemia, but due to patient's end-stage renal disease I will not repeat this. Otherwise labs appear to  be at baseline.  Patient's family member at the bedside reports concern about the fact that the patient has difficulty climbing into his bed which she reports is "too high for him". They also wonder what can be done about his generalized weakness. Both issues appear to be chronic and unable to be resolved here in the emergency department tonight. I did advise him that weakness after dialysis is very common in many times to pull and he did take a long nap after dialysis. He may also need to get a lower bed or some other sleeping arrangement that is more suitable to a person of his age and comorbidities. There do not appear to be any acute complaints or findings today and he is suitable for discharge home and follow-up with primary care.     ____________________________________________   FINAL CLINICAL IMPRESSION(S) / ED DIAGNOSES  Final diagnoses:  Generalized weakness       Portions of this note were generated with dragon dictation software. Dictation errors may occur despite best attempts at proofreading.   Carrie Mew, MD 07/26/15 [phone]

## 2015-07-26 NOTE — ED Notes (Signed)
Pt arrived via EMS from home after a fall. Pt fell approximately 2 hours ago, EMS reports that they go out usually once a shift to pick him up off the floor from a fall but today when they rolled him over there was the strong smell of urine and they stated that he felt hot to them and they were unable to get a temperature to register. Pt reports burning with urination recently. Pt is a dialysis pt and gets his treatments MWF, had full treatment today. Pt denies any injury from his fall today.

## 2015-07-26 NOTE — ED Notes (Signed)
Discharge instructions reviewed with patient. Patient verbalized understanding.Patient taken to vehicle via wheelchair and helped into car by staff.

## 2015-07-28 NOTE — Telephone Encounter (Signed)
I spoke with Amy and advised her below. Appointment has been scheduled for patient to come in on Monday 08/08/2015 at 2pm for evaluation of medical necessity for a hospital bed and bed lift (to get in and out of bed). [facility] is going to call [facility] to find out what all she needs from Dr. Caryn Section to get the bed and lift covered.

## 2015-08-03 ENCOUNTER — Ambulatory Visit: Payer: Self-pay | Admitting: Family Medicine

## 2015-08-03 ENCOUNTER — Emergency Department
Admission: EM | Admit: 2015-08-03 | Discharge: 2015-08-03 | Disposition: A | Discharge status: Home / Self Care | Payer: Medicare Other | Source: Home / Self Care | Attending: Emergency Medicine | Admitting: Emergency Medicine

## 2015-08-03 ENCOUNTER — Telehealth: Payer: Self-pay | Admitting: *Deleted

## 2015-08-03 ENCOUNTER — Emergency Department: Payer: Medicare Other

## 2015-08-03 DIAGNOSIS — I120 Hypertensive chronic kidney disease with stage 5 chronic kidney disease or end stage renal disease: Secondary | ICD-10-CM | POA: Insufficient documentation

## 2015-08-03 DIAGNOSIS — Z8673 Personal history of transient ischemic attack (TIA), and cerebral infarction without residual deficits: Secondary | ICD-10-CM | POA: Insufficient documentation

## 2015-08-03 DIAGNOSIS — Z8679 Personal history of other diseases of the circulatory system: Secondary | ICD-10-CM | POA: Insufficient documentation

## 2015-08-03 DIAGNOSIS — N186 End stage renal disease: Secondary | ICD-10-CM | POA: Insufficient documentation

## 2015-08-03 DIAGNOSIS — E785 Hyperlipidemia, unspecified: Secondary | ICD-10-CM | POA: Insufficient documentation

## 2015-08-03 DIAGNOSIS — J449 Chronic obstructive pulmonary disease, unspecified: Secondary | ICD-10-CM | POA: Insufficient documentation

## 2015-08-03 DIAGNOSIS — R531 Weakness: Secondary | ICD-10-CM

## 2015-08-03 DIAGNOSIS — Z87891 Personal history of nicotine dependence: Secondary | ICD-10-CM | POA: Insufficient documentation

## 2015-08-03 DIAGNOSIS — Z992 Dependence on renal dialysis: Secondary | ICD-10-CM | POA: Insufficient documentation

## 2015-08-03 DIAGNOSIS — F329 Major depressive disorder, single episode, unspecified: Secondary | ICD-10-CM | POA: Insufficient documentation

## 2015-08-03 DIAGNOSIS — E1122 Type 2 diabetes mellitus with diabetic chronic kidney disease: Secondary | ICD-10-CM

## 2015-08-03 DIAGNOSIS — Z79899 Other long term (current) drug therapy: Secondary | ICD-10-CM | POA: Insufficient documentation

## 2015-08-03 DIAGNOSIS — R51 Headache: Secondary | ICD-10-CM

## 2015-08-03 DIAGNOSIS — J810 Acute pulmonary edema: Secondary | ICD-10-CM | POA: Diagnosis not present

## 2015-08-03 LAB — CBC WITH DIFFERENTIAL/PLATELET
Basophils Absolute: 0.2 10*3/uL — ABNORMAL HIGH (ref 0–0.1)
Basophils Relative: 2 %
Eosinophils Absolute: 0.2 10*3/uL (ref 0–0.7)
Eosinophils Relative: 2 %
HEMATOCRIT: 30.2 % — AB (ref 40.0–52.0)
HEMOGLOBIN: 10.3 g/dL — AB (ref 13.0–18.0)
LYMPHS ABS: 0.8 10*3/uL — AB (ref 1.0–3.6)
LYMPHS PCT: 11 %
MCH: 32.9 pg (ref 26.0–34.0)
MCHC: 34.2 g/dL (ref 32.0–36.0)
MCV: 96 fL (ref 80.0–100.0)
MONOS PCT: 9 %
Monocytes Absolute: 0.7 10*3/uL (ref 0.2–1.0)
NEUTROS ABS: 5.8 10*3/uL (ref 1.4–6.5)
NEUTROS PCT: 76 %
Platelets: 270 10*3/uL (ref 150–440)
RBC: 3.15 MIL/uL — AB (ref 4.40–5.90)
RDW: 14.8 % — ABNORMAL HIGH (ref 11.5–14.5)
WBC: 7.7 10*3/uL (ref 3.8–10.6)

## 2015-08-03 LAB — TROPONIN I
TROPONIN I: 0.06 ng/mL — AB (ref <0.03)
Troponin I: 0.06 ng/mL (ref <0.03)

## 2015-08-03 LAB — COMPREHENSIVE METABOLIC PANEL
ALBUMIN: 3.6 g/dL (ref 3.5–5.0)
ALK PHOS: 66 U/L (ref 38–126)
ALT: 10 U/L — ABNORMAL LOW (ref 17–63)
AST: 11 U/L — AB (ref 15–41)
Anion gap: 13 (ref 5–15)
BILIRUBIN TOTAL: 0.9 mg/dL (ref 0.3–1.2)
BUN: 58 mg/dL — AB (ref 6–20)
CO2: 20 mmol/L — ABNORMAL LOW (ref 22–32)
CREATININE: 8.31 mg/dL — AB (ref 0.61–1.24)
Calcium: 9.9 mg/dL (ref 8.9–10.3)
Chloride: 108 mmol/L (ref 101–111)
GFR calc Af Amer: 7 mL/min — ABNORMAL LOW (ref >60)
GFR, EST NON AFRICAN AMERICAN: 6 mL/min — AB (ref >60)
GLUCOSE: 94 mg/dL (ref 65–99)
Potassium: 3.7 mmol/L (ref 3.5–5.1)
Sodium: 141 mmol/L (ref 135–145)
Total Protein: 6.7 g/dL (ref 6.5–8.1)

## 2015-08-03 LAB — AMMONIA: Ammonia: 28 umol/L (ref 9–35)

## 2015-08-03 LAB — MAGNESIUM: Magnesium: 1.8 mg/dL (ref 1.7–2.4)

## 2015-08-03 MED ORDER — ACETAMINOPHEN 325 MG PO TABS
650.0000 mg | ORAL_TABLET | Freq: Once | ORAL | Status: AC
  Administered 2015-08-03: 650 mg via ORAL
  Filled 2015-08-03: qty 2

## 2015-08-03 NOTE — ED Notes (Signed)
EMS now willing to take patient. Home health care has been set up for patient today. All needed paperwork has been faxed to agency. Patients family educated on importance of taking care of patient and making sure he is able to get to dialysis. Patient transported back home at this time. Dr. Joni Fears aware.

## 2015-08-03 NOTE — Care Management (Signed)
Received call from Roxana in ED stating that this patient will need home health services. I then spoke with patient's nurse Terrence Dupont and she states that [facility] had already arranged patient for home health through [facility]. Orders needed from discharging MD: "home health order (RN/PT/SW) that includes Face-to-Face documentation" for [facility]/Medicare. I spoke with [place] at [facility] and patient's daughter had called her from a card that she had been given with [facility]'s contact info on a business card.

## 2015-08-03 NOTE — Clinical Social Work Note (Signed)
Clinical Social Work Assessment  Patient Details  Name: Troy Gallagher MRN: [id number] Date of Birth: Mar 30, 1945  Date of referral:  08/03/15               Reason for consult:  [employer]                Permission sought to share information with:  Family Supports Permission granted to share information::     Name::        Agency::     Relationship::     Contact Information:     Housing/Transportation Living arrangements for the past 2 months:  Single Family Home Source of Information:  Adult Children Patient Interpreter Needed:  None Criminal Activity/Legal Involvement Pertinent to Current Situation/Hospitalization:  No - Comment as needed Significant Relationships:  Adult Children Lives with:    Do you feel safe going back to the place where you live?  Yes Need for family participation in patient care:  Yes (Comment)  Care giving concerns: Pt is currently being cared for by his daughter. Pt's daughter needs extra help caring for pt.   Social Worker assessment / plan: CSW and RN Tourist information centre manager engaged with pt's daughter at pt's bedside with pt present. CSW introduced herself and her role as a Education officer, museum. Pt was asleep at the time of the assessment. Pt's daughter would like for her father to go to SNF, however, it was explained to her that this would not be possible because pt does not have the required 3 day inpt stay for Medicare. Pt was given resources for [facility] and will be calling about their personal care services. CSW asked pt if there were other family members in the home that could assist with care of pt while waiting for [facility] services. Pt states that there is no one else that can help. CSW and Rn Case manager encouraged pt's daughter to call [facility] as soon as possible in order to avoid a lapse in care. Pt was hesitant but understanding. No further social work needs at this time. CSW signing off.  Employment status:  Disabled (Comment on whether or not currently receiving  Disability) Insurance information:  Medicare PT Recommendations:  Not assessed at this time Information / Referral to community resources:  Other (Comment Required) ([facility])  Patient/Family's Response to care:  Pt's family is appreciative of the care that pt has received at this time.  Patient/Family's Understanding of and Emotional Response to Diagnosis, Current Treatment, and Prognosis: Pt's family demonstrates an understanding of pt's diagnosis and prognosis at this time.  Emotional Assessment Appearance:  Appears stated age Attitude/Demeanor/Rapport:  Lethargic Affect (typically observed):  Unable to Assess (Pt was asleep at the time of assessment) Orientation:   (Unable to assess. pt was asleep.) Alcohol / Substance use:  Not Applicable Psych involvement (Current and /or in the community):  No (Comment)  Discharge Needs  Concerns to be addressed:  Care Coordination Readmission within the last 30 days:  Yes Current discharge risk:  None Barriers to Discharge:  No Barriers Identified   Georga Kaufmann, LCSWA 08/03/2015, 2:04 PM

## 2015-08-03 NOTE — ED Notes (Addendum)
Home health agency has received fax. Business card and needed paperwork given back to family.

## 2015-08-03 NOTE — ED Notes (Signed)
Patient presents to the ED via [facility] from home for complaint of increasing weakness.  Patient lives at home with family members.  Patient receives M/W/F dialysis but missed dialysis on Monday and Wednesday.  EMS staff reports being call to home multiple times to assist family in moving patient or toilleting patient etc.  EMS also states that family member was giggling as patient was being loaded into a stretcher today to come to the ED.  Patient reports feeling very weak and tired, falling asleep easily during assessment but arousable with verbal stimulation.  Patient denies any pain at this time.

## 2015-08-03 NOTE — ED Notes (Signed)
EMS refusing to transport patient due to inability to stand and hold weight. Dr. Joni Fears made aware and at bedside speaking to patient and EMS staff. Charge nurse made aware.

## 2015-08-03 NOTE — ED Notes (Signed)
Patients soiled brief changed at this time. Patient educated on call light and to press it when he needs to go to the bathroom. Verbalizes understanding.

## 2015-08-03 NOTE — ED Provider Notes (Signed)
Time Seen: Approximately 1040  I have reviewed the triage notes  Chief Complaint: Weakness   History of Present Illness: Troy Gallagher is a 70 y.o. male who has a history of multiple medical problems including a previous cerebrovascular accident, end-stage renal disease, hypertension, etc. Patient presents with increased generalized weakness. Patient lives at home with family and receives dialysis on Monday, Wednesday, and Friday but has missed dialysis on this Monday and Wednesday. The patient does not seem to have any new focal neurologic deficits. Patient himself is somewhat of an unreliable historian and most of his past history is acquired from the medical record. Patient appears to been here for similar complaint on 07-26-2015   Past Medical History  Diagnosis Date  . Complication of anesthesia   . Hypertension   . Stroke ([place])   . GERD (gastroesophageal reflux disease)   . Headache   . Kidney dialysis status [id number]  . HLD (hyperlipidemia)   . Diabetes mellitus, type II ([place])   . Seizure ([place])   . BPH (benign prostatic hyperplasia)   . COPD (chronic obstructive pulmonary disease) ([place])   . PVD (peripheral vascular disease) ([place])   . Carotid stenosis   . Adenoma of large intestine   . ESRD on hemodialysis ([place])     MWF  . Anxiety   . RLS (restless legs syndrome)     Patient Active Problem List   Diagnosis Date Noted  . History of adenomatous polyp of colon 11/30/2014  . GIB (gastrointestinal bleeding) 11/10/2014  . TIA (transient ischemic attack) 10/04/2014  . Rotator cuff tear arthropathy of right shoulder 10/03/2014  . Generalized weakness 10/01/2014  . Elevated troponin 09/30/2014  . Weakness generalized 09/30/2014  . ESRD on hemodialysis ([place]) 09/30/2014  . GERD (gastroesophageal reflux disease) 09/30/2014  . HTN (hypertension) 09/30/2014  . HLD (hyperlipidemia) 09/30/2014  . Type 2 diabetes mellitus ([place]) 09/30/2014  . Anxiety 09/30/2014  . RLS (restless  legs syndrome) 09/30/2014  . History of CVA (cerebrovascular accident) 09/28/2014  . Symptoms, musculoskeletal, limb NEC 09/28/2014  . Anemia 09/28/2014  . Hyperlipidemia, mixed 09/28/2014  . Bilateral carotid artery stenosis 12/04/2011  . Depressive disorder 01/29/1998  . Barrett esophagus 01/29/1998    Past Surgical History  Procedure Laterality Date  . Shoulder arthroscopy w/ rotator cuff repair Left 1999  . Neck surgery N/A 10/24/2009    Cervical Diskectomy- C3-4, 4-5, 5-6 anterior disckectomy by Dr. Arnoldo Morale at [facility]  . Av fistula placement Left 2 years  . Colonoscopy N/A 06/14/2014    Procedure: COLONOSCOPY;  Surgeon: Hulen Luster, MD;  Location: [facility] ENDOSCOPY;  Service: Gastroenterology;  Laterality: N/A;  . Carotid endarterectomy Right 06/05/2013    Dr. Delana Meyer  . Tonsillectomy and adenoidectomy  1950  . 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 Repeat in 3 years  . Carotid doppler ultrasound  04/22/2013    85-90% stenosis of the RICA, 60-70% stenosis of left cmmon carotid and origin of left subclavian  . Doppler echocardiography  12/04/2011    Bilateral stenosis 80%  . Arch aortogram  04/07/2013    85-90% Stenosis origin of right ICA. 65-70% narrowing of left CCA. 50-60% narrowing of left subclavian artery  . Colonoscopy with propofol N/A 11/11/2014    Procedure: COLONOSCOPY WITH PROPOFOL;  Surgeon: Hulen Luster, MD;  Location: [facility] ENDOSCOPY;  Service: Endoscopy;  Laterality: N/A;  . Esophagogastroduodenoscopy (egd) with propofol N/A 11/11/2014  Procedure: ESOPHAGOGASTRODUODENOSCOPY (EGD) WITH PROPOFOL;  Surgeon: Hulen Luster, MD;  Location: [facility] ENDOSCOPY;  Service: Endoscopy;  Laterality: N/A;  . Peripheral vascular catheterization N/A 04/26/2015    Procedure: A/V Shuntogram/Fistulagram;  Surgeon: Katha Cabal, MD;  Location: [place] CV LAB;  Service: Cardiovascular;  Laterality: N/A;  . Peripheral  vascular catheterization N/A 04/26/2015    Procedure: A/V Shunt Intervention;  Surgeon: Katha Cabal, MD;  Location: [place] CV LAB;  Service: Cardiovascular;  Laterality: N/A;    Past Surgical History  Procedure Laterality Date  . Shoulder arthroscopy w/ rotator cuff repair Left 1999  . Neck surgery N/A 10/24/2009    Cervical Diskectomy- C3-4, 4-5, 5-6 anterior disckectomy by Dr. Arnoldo Morale at [facility]  . Av fistula placement Left 2 years  . Colonoscopy N/A 06/14/2014    Procedure: COLONOSCOPY;  Surgeon: Hulen Luster, MD;  Location: [facility] ENDOSCOPY;  Service: Gastroenterology;  Laterality: N/A;  . Carotid endarterectomy Right 06/05/2013    Dr. Delana Meyer  . Tonsillectomy and adenoidectomy  1950  . 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 Repeat in 3 years  . Carotid doppler ultrasound  04/22/2013    85-90% stenosis of the RICA, 60-70% stenosis of left cmmon carotid and origin of left subclavian  . Doppler echocardiography  12/04/2011    Bilateral stenosis 80%  . Arch aortogram  04/07/2013    85-90% Stenosis origin of right ICA. 65-70% narrowing of left CCA. 50-60% narrowing of left subclavian artery  . Colonoscopy with propofol N/A 11/11/2014    Procedure: COLONOSCOPY WITH PROPOFOL;  Surgeon: Hulen Luster, MD;  Location: [facility] ENDOSCOPY;  Service: Endoscopy;  Laterality: N/A;  . Esophagogastroduodenoscopy (egd) with propofol N/A 11/11/2014    Procedure: ESOPHAGOGASTRODUODENOSCOPY (EGD) WITH PROPOFOL;  Surgeon: Hulen Luster, MD;  Location: [facility] ENDOSCOPY;  Service: Endoscopy;  Laterality: N/A;  . Peripheral vascular catheterization N/A 04/26/2015    Procedure: A/V Shuntogram/Fistulagram;  Surgeon: Katha Cabal, MD;  Location: [place] CV LAB;  Service: Cardiovascular;  Laterality: N/A;  . Peripheral vascular catheterization N/A 04/26/2015    Procedure: A/V Shunt Intervention;  Surgeon: Katha Cabal, MD;  Location: [place] CV LAB;  Service: Cardiovascular;  Laterality: N/A;    Current Outpatient Rx  Name  Route  Sig  Dispense  Refill  . acetaminophen (TYLENOL) 325 MG tablet   Oral   Take 650 mg by mouth every 6 (six) hours as needed for mild pain or headache.         . albuterol-ipratropium (COMBIVENT) 18-103 MCG/ACT inhaler   Inhalation   Inhale 2 puffs into the lungs every 4 (four) hours as needed for wheezing or shortness of breath.         . allopurinol (ZYLOPRIM) 100 MG tablet   Oral   Take 100 mg by mouth every evening.          [place] Kitchen ALPRAZolam (XANAX) 0.5 MG tablet   Oral   Take 1 tablet (0.5 mg total) by mouth at bedtime.   180 tablet   1   . amLODipine (NORVASC) 10 MG tablet   Oral   Take 10 mg by mouth daily. Pt does not take on dialysis days.         [place] Kitchen atorvastatin (LIPITOR) 40 MG tablet   Oral   Take 40 mg by mouth every evening.          [place] Kitchen azelastine (ASTELIN)  0.1 % nasal spray   Each Nare   Place 2 sprays into both nostrils daily as needed for rhinitis.          . cinacalcet (SENSIPAR) 30 MG tablet   Oral   Take 30 mg by mouth every evening.         . cycloSPORINE (RESTASIS) 0.05 % ophthalmic emulsion   Both Eyes   Place 1 drop into both eyes 2 (two) times daily.   0.4 mL   3   . diphenhydrAMINE (BENADRYL) 25 MG tablet   Oral   Take 25 mg by mouth at bedtime as needed for itching or sleep.         [place] Kitchen esomeprazole (NEXIUM) 40 MG capsule   Oral   Take 1 capsule (40 mg total) by mouth 2 (two) times daily.   60 capsule   6   . ferrous sulfate 325 (65 FE) MG tablet   Oral   Take 1 tablet (325 mg total) by mouth 2 (two) times daily with a meal.   60 tablet   3   . FLUoxetine (PROZAC) 20 MG capsule   Oral   Take 20 mg by mouth daily.      5   . furosemide (LASIX) 80 MG tablet   Oral   Take 80 mg by mouth daily. Pt does not take on dialysis days.         [place] Kitchen labetalol (NORMODYNE) 100 MG tablet   Oral   Take 100 mg by mouth 2 (two) times  daily.         [place] Kitchen lidocaine-prilocaine (EMLA) cream   Topical   Apply 1 application topically as needed (before port access).          [place] Kitchen loratadine (CLARITIN) 10 MG tablet   Oral   Take 10 mg by mouth daily as needed for allergies.          [place] Kitchen losartan (COZAAR) 25 MG tablet   Oral   Take 25 mg by mouth every evening.         . meclizine (ANTIVERT) 25 MG tablet   Oral   Take 25 mg by mouth 2 (two) times daily as needed for dizziness.         [place] Kitchen oxyCODONE (OXY IR/ROXICODONE) 5 MG immediate release tablet   Oral   Take 1 tablet (5 mg total) by mouth every 4 (four) hours as needed for severe pain.   20 tablet   0   . polyvinyl alcohol (LIQUIFILM TEARS) 1.4 % ophthalmic solution   Both Eyes   Place 1 drop into both eyes as needed for dry eyes.   15 mL   0   . potassium chloride SA (K-DUR,KLOR-CON) 20 MEQ tablet   Oral   Take 20 mEq by mouth daily.         [place] Kitchen rOPINIRole (REQUIP) 2 MG tablet   Oral   Take 2 mg by mouth at bedtime.         . sevelamer carbonate (RENVELA) 800 MG tablet   Oral   Take 2 tablets (1,600 mg total) by mouth 3 (three) times daily with meals.   90 tablet   6   . traMADol-acetaminophen (ULTRACET) 37.5-325 MG tablet   Oral   Take 1 tablet by mouth every 6 (six) hours as needed for moderate pain.   40 tablet   0     Allergies:  Sulfa antibiotics and Sulfur  Family History: Family History  Problem Relation Age of Onset  . Stroke Mother   . Prostate cancer Father   . Heart disease Sister     Social History: Social History  Substance Use Topics  . Smoking status: Former Smoker -- 2.00 packs/day for 30 years    Types: Cigarettes    Quit date: 01/29/2002  . Smokeless tobacco: Current User  . Alcohol Use: No     Review of Systems:   10 point review of systems was performed and was otherwise negative:  Constitutional: No fever Eyes: No visual disturbances ENT: No sore throat, ear pain Cardiac: No chest  pain Respiratory: No shortness of breath, wheezing, or stridor Abdomen: No abdominal pain, no vomiting, No diarrhea Endocrine: No weight loss, No night sweats Extremities: No peripheral edema, cyanosis Skin: No rashes, easy bruising Neurologic: No focal weakness, trouble with speech or swollowing Urologic: No dysuria, Hematuria, or urinary frequency *  Physical Exam:  ED Triage Vitals  Enc Vitals Group     BP 08/03/15 1021 189/74 mmHg     Pulse Rate 08/03/15 1021 70     Resp 08/03/15 1021 22     Temp 08/03/15 1021 97.9 F (36.6 C)     Temp Source 08/03/15 1021 Oral     SpO2 08/03/15 1021 96 %     Weight 08/03/15 1021 212 lb 12.8 oz (96.525 kg)     Height 08/03/15 1021 5\' 9"  (1.753 m)     Head Cir --      Peak Flow --      Pain Score 08/03/15 1023 0     Pain Loc --      Pain Edu? --      Excl. in [place]? --     General: Awake , Alert , and Oriented times 2. Patient follows commands and has a Glasgow Coma Scale of 15. Head: Normal cephalic , atraumatic Eyes: Pupils equal , round, reactive to light Nose/Throat: No nasal drainage, patent upper airway without erythema or exudate.  Neck: Supple, Full range of motion, No anterior adenopathy or palpable thyroid masses Lungs: Clear to ascultation without wheezes , rhonchi, or rales Heart: Regular rate, regular rhythm without murmurs , gallops , or rubs Abdomen: Soft, non tender without rebound, guarding , or rigidity; bowel sounds positive and symmetric in all 4 quadrants. No organomegaly .        Extremities: 2 plus symmetric pulses. No edema, clubbing or cyanosis Neurologic: Motor exam shows the right side to be weaker than the left in both the upper and lower extremity. Negative drift Skin: warm, dry, no rashes   Labs:   All laboratory work was reviewed including any pertinent negatives or positives listed below:  Labs Reviewed  COMPREHENSIVE METABOLIC PANEL - Abnormal; Notable for the following:    CO2 20 (*)    BUN 58 (*)     Creatinine, Ser 8.31 (*)    AST 11 (*)    ALT 10 (*)    GFR calc non Af Amer 6 (*)    GFR calc Af Amer 7 (*)    All other components within normal limits  CBC WITH DIFFERENTIAL/PLATELET - Abnormal; Notable for the following:    RBC 3.15 (*)    Hemoglobin 10.3 (*)    HCT 30.2 (*)    RDW 14.8 (*)    Lymphs Abs 0.8 (*)    Basophils Absolute 0.2 (*)    All other components within normal limits  TROPONIN I - Abnormal; Notable for  the following:    Troponin I 0.06 (*)    All other components within normal limits  TROPONIN I - Abnormal; Notable for the following:    Troponin I 0.06 (*)    All other components within normal limits  MAGNESIUM  AMMONIA  Reviewed the patient's labs appear to be within normal limits for the patient with renal failure, etc. 3 hour troponin is unchanged. Troponin likely elevated secondary to renal failure  EKG:  ED ECG REPORT I, Daymon Larsen, the attending physician, personally viewed and interpreted this ECG.  Date: 08/03/2015 EKG Time: 1022 Rate: 70 Rhythm: normal sinus rhythm QRS Axis: normal Intervals: normal ST/T Wave abnormalities: normal Conduction Disturbances: none Narrative Interpretation: unremarkable No acute ischemic changes   Radiology: *  CT HEAD WO CONTRAST (Final result) Result time: 08/03/15 12:12:08   Final result by Rad Results In Interface (08/03/15 12:12:08)   Narrative:   CLINICAL DATA: Increasing weakness  EXAM: CT HEAD WITHOUT CONTRAST  TECHNIQUE: Contiguous axial images were obtained from the base of the skull through the vertex without intravenous contrast.  COMPARISON: Brain MRI 10/05/2014. PET-CT 09/30/2014.  FINDINGS: There is no evidence for acute hemorrhage, hydrocephalus, mass lesion, or abnormal extra-axial fluid collection. No definite CT evidence for acute infarction. Diffuse loss of parenchymal volume is consistent with atrophy. Patchy low attenuation in the deep hemispheric and  periventricular white matter is nonspecific, but likely reflects chronic microvascular ischemic demyelination. Stable appearance posterior left temporal lobe infarct. The visualized paranasal sinuses and mastoid air cells are clear.  IMPRESSION: 1. Stable exam. No acute intracranial abnormality. 2. Atrophy and chronic small vessel white matter ischemic demyelination. 3. Old posterior left temporal lobe infarct.   Electronically Signed By: Misty Stanley M.D. On: 08/03/2015 12:12          DG Chest 2 View (Final result) Result time: 08/03/15 11:24:34   Final result by Rad Results In Interface (08/03/15 11:24:34)   Narrative:   CLINICAL DATA: Shortness of breath  EXAM: CHEST 2 VIEW  COMPARISON: 07/26/2015  FINDINGS: Chronic cardiopericardial enlargement with no definitive increase or change in shape when allowing for differences and projection. Pulmonary venous congestion. Layering pleural effusions seen laterally, likely on the right.  Left-sided vascular stent. Right humeral head sclerosis may reflect AVN.  IMPRESSION: Mild CHF.   Electronically Signed By: Monte Fantasia M.D. On: 08/03/2015 11:24        ECG Results        I personally reviewed the radiologic studies     ED Course:  The patient appears to not require any immediate dialysis at this time. His physical exam in general presentation does not appear to be atypical given his history of renal failure, etc. There does not appear to be any new focal deficits and his head CT was unchanged from prior exams. The patient's case has been referred to social services and arrangements have been made for home health, etc. Patient should proceed with his scheduled dialysis as soon as possible on an outpatient basis.    Assessment:  Generalized weakness History of renal failure    Plan:  Outpatient management Patient was advised to return immediately if condition worsens. Patient  was advised to follow up with their primary care physician or other specialized physicians involved in their outpatient care. The patient and/or family member/power of attorney had laboratory results reviewed at the bedside. All questions and concerns were addressed and appropriate discharge instructions were distributed by the nursing staff.  Daymon Larsen, MD 08/03/15 [phone]

## 2015-08-03 NOTE — ED Notes (Signed)
Patient moved to hallway bed while waiting on EMS.

## 2015-08-03 NOTE — Care Management Note (Signed)
Case Management Note  Patient Details  Name: EITO OLSAVSKY MRN: [id number] Date of Birth: 04/13/1945  Subjective/Objective:    Spoek to Amy, pt. Daughter with CSW Jeani Hawking in attendance. Gave Amy a large yellow card , with phone info for Butch Penny at [facility]. Encouraged her to head home in advance of pt. To make sure they speak. Amy is concerned about how to get pt. To HD Friday, and I have explained to her that right now we need to get her systems in place to help her care for the pt. First. Then they can help her work that out. I have also encouraged ehr to talk to one of the personal care agencies to get someone paid out of pocket , to be in home for help caring for the patient ASAP. She says she has no further questions but does seem to be absorbing our information.                Action/Plan:   Expected Discharge Date:                  Expected Discharge Plan:     In-House Referral:     Discharge planning Services     Post Acute Care Choice:    Choice offered to:     DME Arranged:    DME Agency:     HH Arranged:    HH Agency:     Status of Service:     If discussed at [employer] of Stay Meetings, dates discussed:    Additional Comments:  Beau Fanny, RN 08/03/2015, 2:00 PM

## 2015-08-03 NOTE — Telephone Encounter (Signed)
Left message on pt's daughter's vm requesting cb. Called concerning Home health for pt. We are going ahead and ordering home health for pt. Need to know which service and by when? Also pt needs to schedule follow-up office visit for home health within the next 30 days. Patient already has ov appt scheduled for 08/09/2015.

## 2015-08-03 NOTE — ED Notes (Signed)
Appropriate paperwork given to Troy Gallagher, ED secretary for her to fax to home health agency. Patient and family made aware.

## 2015-08-03 NOTE — ED Notes (Signed)
EMS here to transport patient back home. Amy (Daughter) called to make sure she was home. She states "Yes I'm here. I've been waiting".

## 2015-08-03 NOTE — ED Notes (Signed)
Estill Bamberg, ED Secretary notified of need of non emergency transport needed to transport patient back home.

## 2015-08-03 NOTE — Care Management Note (Signed)
Case Management Note  Patient Details  Name: Troy Gallagher MRN: [id number] Date of Birth: 1945/11/21  Subjective/Objective:      Spoke to family at bedside about pt. Not meeting admission criteria. The daughter says she can no longer manage and is in danger of being charged with abuse of EMS because she calls them for help with pt. Care. I called Butch Penny at [facility] and made a referral as well as talking to the daughter Amy Quillian Quince about the fact that Medicare will not pay to place a person unless they meet the 3 day IP stay rule. She     Appears overwhelmed , and could not make sense to the people at Home instead about what she needs in place,. The patient gave me his information about income and I rpovided that and the patient data to [facility]. Butch Penny says she will work with the pt. And family. Daughter has been made aware the pt. Will be discharged home by EMS , and DOnna will call them ASAP.          Action/Plan:   Expected Discharge Date:                  Expected Discharge Plan:     In-House Referral:     Discharge planning Services     Post Acute Care Choice:    Choice offered to:     DME Arranged:    DME Agency:     HH Arranged:    HH Agency:     Status of Service:     If discussed at [employer] of Stay Meetings, dates discussed:    Additional Comments:  Beau Fanny, RN 08/03/2015, 1:07 PM

## 2015-08-03 NOTE — ED Notes (Signed)
MD in room to assess patient at this time.   

## 2015-08-03 NOTE — Discharge Instructions (Signed)
Weakness Weakness is a lack of strength. It may be felt all over the body (generalized) or in one specific part of the body (focal). Some causes of weakness can be serious. You may need further medical evaluation, especially if you are elderly or you have a history of immunosuppression (such as chemotherapy or HIV), kidney disease, heart disease, or diabetes. CAUSES  Weakness can be caused by many different things, including:  Infection.  Physical exhaustion.  Internal bleeding or other blood loss that results in a lack of red blood cells (anemia).  Dehydration. This cause is more common in elderly people.  Side effects or electrolyte abnormalities from medicines, such as pain medicines or sedatives.  Emotional distress, anxiety, or depression.  Circulation problems, especially severe peripheral arterial disease.  Heart disease, such as rapid atrial fibrillation, bradycardia, or heart failure.  Nervous system disorders, such as Guillain-Barr syndrome, multiple sclerosis, or stroke. DIAGNOSIS  To find the cause of your weakness, your caregiver will take your history and perform a physical exam. Lab tests or X-rays may also be ordered, if needed. TREATMENT  Treatment of weakness depends on the cause of your symptoms and can vary greatly. HOME CARE INSTRUCTIONS   Rest as needed.  Eat a well-balanced diet.  Try to get some exercise every day.  Only take over-the-counter or prescription medicines as directed by your caregiver. SEEK MEDICAL CARE IF:   Your weakness seems to be getting worse or spreads to other parts of your body.  You develop new aches or pains. SEEK IMMEDIATE MEDICAL CARE IF:   You cannot perform your normal daily activities, such as getting dressed and feeding yourself.  You cannot walk up and down stairs, or you feel exhausted when you do so.  You have shortness of breath or chest pain.  You have difficulty moving parts of your body.  You have weakness  in only one area of the body or on only one side of the body.  You have a fever.  You have trouble speaking or swallowing.  You cannot control your bladder or bowel movements.  You have black or bloody vomit or stools. MAKE SURE YOU:  Understand these instructions.  Will watch your condition.  Will get help right away if you are not doing well or get worse.   This information is not intended to replace advice given to you by your health care provider. Make sure you discuss any questions you have with your health care provider.   Document Released: 01/15/2005 Document Revised: 07/17/2011 Document Reviewed: 03/16/2011 Elsevier Interactive Patient Education [employer].   Please return immediately if condition worsens. Please contact her primary physician or the physician you were given for referral. If you have any specialist physicians involved in her treatment and plan please also contact them. Thank you for using [facility]. Please acquire dialysis as soon as possible. Continue current medications. Follow up with nephrology

## 2015-08-03 NOTE — ED Notes (Signed)
Family educated on discharge instructions. This RN strongly encouraged family to call the dialysis center and home health agency in regards to getting patient to dialysis. Family states, "Oh he will just miss it". Family educated on importance of dialysis and risk of not taking patient to dialysis treatment. Verbalize understanding.

## 2015-08-04 NOTE — Telephone Encounter (Signed)
Order needs to be for PT and OT. For nursing and home health aid.

## 2015-08-05 ENCOUNTER — Encounter: Payer: Self-pay | Admitting: Emergency Medicine

## 2015-08-05 ENCOUNTER — Emergency Department: Payer: Medicare Other

## 2015-08-05 ENCOUNTER — Inpatient Hospital Stay
Admission: EM | Admit: 2015-08-05 | Discharge: 2015-08-11 | DRG: 189 | Disposition: A | Discharge status: Skilled Nursing Facility | Payer: Medicare Other | Attending: Internal Medicine | Admitting: Internal Medicine

## 2015-08-05 ENCOUNTER — Telehealth: Payer: Self-pay

## 2015-08-05 DIAGNOSIS — R410 Disorientation, unspecified: Secondary | ICD-10-CM

## 2015-08-05 DIAGNOSIS — E785 Hyperlipidemia, unspecified: Secondary | ICD-10-CM | POA: Diagnosis present

## 2015-08-05 DIAGNOSIS — F419 Anxiety disorder, unspecified: Secondary | ICD-10-CM | POA: Diagnosis present

## 2015-08-05 DIAGNOSIS — R443 Hallucinations, unspecified: Secondary | ICD-10-CM

## 2015-08-05 DIAGNOSIS — R1110 Vomiting, unspecified: Secondary | ICD-10-CM

## 2015-08-05 DIAGNOSIS — M109 Gout, unspecified: Secondary | ICD-10-CM | POA: Diagnosis present

## 2015-08-05 DIAGNOSIS — J810 Acute pulmonary edema: Secondary | ICD-10-CM | POA: Diagnosis present

## 2015-08-05 DIAGNOSIS — R51 Headache: Secondary | ICD-10-CM | POA: Diagnosis not present

## 2015-08-05 DIAGNOSIS — R531 Weakness: Secondary | ICD-10-CM

## 2015-08-05 DIAGNOSIS — N19 Unspecified kidney failure: Secondary | ICD-10-CM | POA: Diagnosis present

## 2015-08-05 DIAGNOSIS — I10 Essential (primary) hypertension: Secondary | ICD-10-CM

## 2015-08-05 DIAGNOSIS — F0390 Unspecified dementia without behavioral disturbance: Secondary | ICD-10-CM | POA: Diagnosis present

## 2015-08-05 DIAGNOSIS — Z992 Dependence on renal dialysis: Secondary | ICD-10-CM | POA: Diagnosis not present

## 2015-08-05 DIAGNOSIS — Z79899 Other long term (current) drug therapy: Secondary | ICD-10-CM

## 2015-08-05 DIAGNOSIS — G9340 Encephalopathy, unspecified: Secondary | ICD-10-CM | POA: Diagnosis present

## 2015-08-05 DIAGNOSIS — N2581 Secondary hyperparathyroidism of renal origin: Secondary | ICD-10-CM | POA: Diagnosis present

## 2015-08-05 DIAGNOSIS — D631 Anemia in chronic kidney disease: Secondary | ICD-10-CM | POA: Diagnosis present

## 2015-08-05 DIAGNOSIS — E1122 Type 2 diabetes mellitus with diabetic chronic kidney disease: Secondary | ICD-10-CM | POA: Diagnosis present

## 2015-08-05 DIAGNOSIS — I69351 Hemiplegia and hemiparesis following cerebral infarction affecting right dominant side: Secondary | ICD-10-CM

## 2015-08-05 DIAGNOSIS — E1151 Type 2 diabetes mellitus with diabetic peripheral angiopathy without gangrene: Secondary | ICD-10-CM | POA: Diagnosis present

## 2015-08-05 DIAGNOSIS — R112 Nausea with vomiting, unspecified: Secondary | ICD-10-CM

## 2015-08-05 DIAGNOSIS — J449 Chronic obstructive pulmonary disease, unspecified: Secondary | ICD-10-CM | POA: Diagnosis present

## 2015-08-05 DIAGNOSIS — N400 Enlarged prostate without lower urinary tract symptoms: Secondary | ICD-10-CM | POA: Diagnosis present

## 2015-08-05 DIAGNOSIS — Z8673 Personal history of transient ischemic attack (TIA), and cerebral infarction without residual deficits: Secondary | ICD-10-CM

## 2015-08-05 DIAGNOSIS — G2581 Restless legs syndrome: Secondary | ICD-10-CM | POA: Diagnosis present

## 2015-08-05 DIAGNOSIS — K219 Gastro-esophageal reflux disease without esophagitis: Secondary | ICD-10-CM | POA: Diagnosis present

## 2015-08-05 DIAGNOSIS — Z79891 Long term (current) use of opiate analgesic: Secondary | ICD-10-CM

## 2015-08-05 DIAGNOSIS — D638 Anemia in other chronic diseases classified elsewhere: Secondary | ICD-10-CM

## 2015-08-05 DIAGNOSIS — K5900 Constipation, unspecified: Secondary | ICD-10-CM | POA: Diagnosis not present

## 2015-08-05 DIAGNOSIS — Z8711 Personal history of peptic ulcer disease: Secondary | ICD-10-CM

## 2015-08-05 DIAGNOSIS — I120 Hypertensive chronic kidney disease with stage 5 chronic kidney disease or end stage renal disease: Secondary | ICD-10-CM | POA: Diagnosis present

## 2015-08-05 DIAGNOSIS — G9341 Metabolic encephalopathy: Secondary | ICD-10-CM

## 2015-08-05 DIAGNOSIS — Z720 Tobacco use: Secondary | ICD-10-CM

## 2015-08-05 DIAGNOSIS — R778 Other specified abnormalities of plasma proteins: Secondary | ICD-10-CM

## 2015-08-05 DIAGNOSIS — R627 Adult failure to thrive: Secondary | ICD-10-CM | POA: Diagnosis present

## 2015-08-05 DIAGNOSIS — Z882 Allergy status to sulfonamides status: Secondary | ICD-10-CM

## 2015-08-05 DIAGNOSIS — R748 Abnormal levels of other serum enzymes: Secondary | ICD-10-CM | POA: Diagnosis present

## 2015-08-05 DIAGNOSIS — N186 End stage renal disease: Secondary | ICD-10-CM

## 2015-08-05 DIAGNOSIS — R7989 Other specified abnormal findings of blood chemistry: Secondary | ICD-10-CM

## 2015-08-05 LAB — CBC WITH DIFFERENTIAL/PLATELET
BASOS ABS: 0.1 10*3/uL (ref 0–0.1)
BASOS PCT: 1 %
EOS ABS: 0.3 10*3/uL (ref 0–0.7)
Eosinophils Relative: 4 %
HEMATOCRIT: 30 % — AB (ref 40.0–52.0)
HEMOGLOBIN: 10.2 g/dL — AB (ref 13.0–18.0)
Lymphocytes Relative: 13 %
Lymphs Abs: 0.9 10*3/uL — ABNORMAL LOW (ref 1.0–3.6)
MCH: 32.4 pg (ref 26.0–34.0)
MCHC: 34.1 g/dL (ref 32.0–36.0)
MCV: 95.2 fL (ref 80.0–100.0)
Monocytes Absolute: 0.7 10*3/uL (ref 0.2–1.0)
Monocytes Relative: 9 %
NEUTROS ABS: 5.2 10*3/uL (ref 1.4–6.5)
NEUTROS PCT: 73 %
Platelets: 270 10*3/uL (ref 150–440)
RBC: 3.15 MIL/uL — AB (ref 4.40–5.90)
RDW: 14.8 % — ABNORMAL HIGH (ref 11.5–14.5)
WBC: 7.1 10*3/uL (ref 3.8–10.6)

## 2015-08-05 LAB — COMPREHENSIVE METABOLIC PANEL
ALK PHOS: 64 U/L (ref 38–126)
ALT: 10 U/L — ABNORMAL LOW (ref 17–63)
ANION GAP: 13 (ref 5–15)
AST: 12 U/L — ABNORMAL LOW (ref 15–41)
Albumin: 3.5 g/dL (ref 3.5–5.0)
BILIRUBIN TOTAL: 0.5 mg/dL (ref 0.3–1.2)
BUN: 70 mg/dL — ABNORMAL HIGH (ref 6–20)
CALCIUM: 9.3 mg/dL (ref 8.9–10.3)
CO2: 18 mmol/L — AB (ref 22–32)
Chloride: 110 mmol/L (ref 101–111)
Creatinine, Ser: 9.62 mg/dL — ABNORMAL HIGH (ref 0.61–1.24)
GFR, EST AFRICAN AMERICAN: 6 mL/min — AB (ref >60)
GFR, EST NON AFRICAN AMERICAN: 5 mL/min — AB (ref >60)
Glucose, Bld: 111 mg/dL — ABNORMAL HIGH (ref 65–99)
Potassium: 3.9 mmol/L (ref 3.5–5.1)
SODIUM: 141 mmol/L (ref 135–145)
TOTAL PROTEIN: 6.7 g/dL (ref 6.5–8.1)

## 2015-08-05 LAB — GLUCOSE, CAPILLARY: GLUCOSE-CAPILLARY: 123 mg/dL — AB (ref 65–99)

## 2015-08-05 MED ORDER — FUROSEMIDE 40 MG PO TABS
80.0000 mg | ORAL_TABLET | Freq: Every day | ORAL | Status: DC
  Administered 2015-08-06 – 2015-08-11 (×6): 80 mg via ORAL
  Filled 2015-08-05 (×6): qty 2

## 2015-08-05 MED ORDER — HYDRALAZINE HCL 20 MG/ML IJ SOLN
10.0000 mg | INTRAMUSCULAR | Status: DC | PRN
  Administered 2015-08-05 – 2015-08-08 (×4): 10 mg via INTRAVENOUS
  Filled 2015-08-05 (×4): qty 1

## 2015-08-05 MED ORDER — HEPARIN SODIUM (PORCINE) 5000 UNIT/ML IJ SOLN
5000.0000 [IU] | Freq: Three times a day (TID) | INTRAMUSCULAR | Status: DC
  Administered 2015-08-05 – 2015-08-11 (×18): 5000 [IU] via SUBCUTANEOUS
  Filled 2015-08-05 (×18): qty 1

## 2015-08-05 MED ORDER — CYCLOSPORINE 0.05 % OP EMUL
1.0000 [drp] | Freq: Two times a day (BID) | OPHTHALMIC | Status: DC
Start: 2015-08-05 — End: 2015-08-11
  Administered 2015-08-05 – 2015-08-11 (×9): 1 [drp] via OPHTHALMIC
  Filled 2015-08-05 (×11): qty 1

## 2015-08-05 MED ORDER — FERROUS SULFATE 325 (65 FE) MG PO TABS
325.0000 mg | ORAL_TABLET | Freq: Two times a day (BID) | ORAL | Status: DC
  Administered 2015-08-06 – 2015-08-11 (×8): 325 mg via ORAL
  Filled 2015-08-05 (×10): qty 1

## 2015-08-05 MED ORDER — OXYCODONE HCL 5 MG PO TABS
5.0000 mg | ORAL_TABLET | ORAL | Status: DC | PRN
  Administered 2015-08-08: 5 mg via ORAL
  Filled 2015-08-05: qty 1

## 2015-08-05 MED ORDER — ONDANSETRON HCL 4 MG/2ML IJ SOLN
4.0000 mg | Freq: Four times a day (QID) | INTRAMUSCULAR | Status: DC | PRN
  Administered 2015-08-07: 4 mg via INTRAVENOUS
  Filled 2015-08-05: qty 2

## 2015-08-05 MED ORDER — ONDANSETRON HCL 4 MG PO TABS: 4.0000 mg | ORAL_TABLET | Freq: Four times a day (QID) | ORAL | Status: DC | PRN

## 2015-08-05 MED ORDER — LIDOCAINE-PRILOCAINE 2.5-2.5 % EX CREA
1.0000 "application " | TOPICAL_CREAM | CUTANEOUS | Status: DC | PRN
  Filled 2015-08-05: qty 5

## 2015-08-05 MED ORDER — POLYVINYL ALCOHOL 1.4 % OP SOLN
1.0000 [drp] | OPHTHALMIC | Status: DC | PRN
  Administered 2015-08-05 – 2015-08-07 (×2): 1 [drp] via OPHTHALMIC
  Filled 2015-08-05: qty 15

## 2015-08-05 MED ORDER — SEVELAMER CARBONATE 800 MG PO TABS
1600.0000 mg | ORAL_TABLET | Freq: Three times a day (TID) | ORAL | Status: DC
Start: 2015-08-06 — End: 2015-08-11
  Administered 2015-08-06 – 2015-08-11 (×9): 1600 mg via ORAL
  Filled 2015-08-05 (×12): qty 2

## 2015-08-05 MED ORDER — ALLOPURINOL 100 MG PO TABS
100.0000 mg | ORAL_TABLET | Freq: Every evening | ORAL | Status: DC
  Administered 2015-08-06 – 2015-08-10 (×3): 100 mg via ORAL
  Filled 2015-08-05 (×4): qty 1

## 2015-08-05 MED ORDER — ROPINIROLE HCL 1 MG PO TABS
2.0000 mg | ORAL_TABLET | Freq: Every day | ORAL | Status: DC
  Administered 2015-08-05 – 2015-08-10 (×6): 2 mg via ORAL
  Filled 2015-08-05 (×6): qty 2

## 2015-08-05 MED ORDER — ATORVASTATIN CALCIUM 20 MG PO TABS
40.0000 mg | ORAL_TABLET | Freq: Every evening | ORAL | Status: DC
  Administered 2015-08-06 – 2015-08-10 (×3): 40 mg via ORAL
  Filled 2015-08-05 (×4): qty 2

## 2015-08-05 MED ORDER — LOSARTAN POTASSIUM 25 MG PO TABS
25.0000 mg | ORAL_TABLET | Freq: Every evening | ORAL | Status: DC
Start: 2015-08-05 — End: 2015-08-11
  Administered 2015-08-06 – 2015-08-10 (×3): 25 mg via ORAL
  Filled 2015-08-05 (×4): qty 1

## 2015-08-05 MED ORDER — ACETAMINOPHEN 650 MG RE SUPP
650.0000 mg | Freq: Four times a day (QID) | RECTAL | Status: DC | PRN
Start: 2015-08-05 — End: 2015-08-11

## 2015-08-05 MED ORDER — AZELASTINE HCL 0.1 % NA SOLN
2.0000 | Freq: Every day | NASAL | Status: DC | PRN
  Filled 2015-08-05: qty 30

## 2015-08-05 MED ORDER — AMLODIPINE BESYLATE 10 MG PO TABS
10.0000 mg | ORAL_TABLET | Freq: Every day | ORAL | Status: DC
  Administered 2015-08-08 – 2015-08-11 (×4): 10 mg via ORAL
  Filled 2015-08-05: qty 1
  Filled 2015-08-05: qty 2
  Filled 2015-08-05 (×3): qty 1
  Filled 2015-08-05: qty 2

## 2015-08-05 MED ORDER — SODIUM CHLORIDE 0.9% FLUSH
3.0000 mL | Freq: Two times a day (BID) | INTRAVENOUS | Status: DC
  Administered 2015-08-05 – 2015-08-11 (×9): 3 mL via INTRAVENOUS

## 2015-08-05 MED ORDER — MORPHINE SULFATE (PF) 2 MG/ML IV SOLN: 2.0000 mg | INTRAVENOUS | Status: DC | PRN

## 2015-08-05 MED ORDER — CINACALCET HCL 30 MG PO TABS
30.0000 mg | ORAL_TABLET | Freq: Every day | ORAL | Status: DC
  Administered 2015-08-06 – 2015-08-10 (×3): 30 mg via ORAL
  Filled 2015-08-05 (×4): qty 1

## 2015-08-05 MED ORDER — FLUOXETINE HCL 20 MG PO CAPS
20.0000 mg | ORAL_CAPSULE | Freq: Every day | ORAL | Status: DC
  Administered 2015-08-06 – 2015-08-11 (×6): 20 mg via ORAL
  Filled 2015-08-05 (×7): qty 1

## 2015-08-05 MED ORDER — IPRATROPIUM-ALBUTEROL 0.5-2.5 (3) MG/3ML IN SOLN: 3.0000 mL | RESPIRATORY_TRACT | Status: DC | PRN

## 2015-08-05 MED ORDER — LORATADINE 10 MG PO TABS: 10.0000 mg | ORAL_TABLET | Freq: Every day | ORAL | Status: DC | PRN

## 2015-08-05 MED ORDER — ACETAMINOPHEN 325 MG PO TABS
650.0000 mg | ORAL_TABLET | Freq: Four times a day (QID) | ORAL | Status: DC | PRN
Start: 2015-08-05 — End: 2015-08-11
  Administered 2015-08-05 – 2015-08-11 (×6): 650 mg via ORAL
  Filled 2015-08-05 (×5): qty 2

## 2015-08-05 MED ORDER — PANTOPRAZOLE SODIUM 40 MG PO TBEC
40.0000 mg | DELAYED_RELEASE_TABLET | Freq: Every day | ORAL | Status: DC
  Administered 2015-08-06 – 2015-08-08 (×3): 40 mg via ORAL
  Filled 2015-08-05 (×4): qty 1

## 2015-08-05 MED ORDER — LABETALOL HCL 100 MG PO TABS
100.0000 mg | ORAL_TABLET | Freq: Two times a day (BID) | ORAL | Status: DC
  Administered 2015-08-05 – 2015-08-11 (×11): 100 mg via ORAL
  Filled 2015-08-05 (×11): qty 1

## 2015-08-05 NOTE — H&P (Signed)
[place] [place] NAME: Troy Gallagher    MR#:  [id number]  DATE OF BIRTH:  07/27/1945   DATE OF ADMISSION:  08/05/2015  PRIMARY CARE PHYSICIAN: Lelon Huh, MD   REQUESTING/REFERRING PHYSICIAN: Marcelene Butte  CHIEF COMPLAINT:   Chief Complaint  Patient presents with  . Weakness  . Failure To Thrive    HISTORY OF PRESENT ILLNESS:  Troy Gallagher  is a 70 y.o. male with a known history of End-stage renal disease on hemodialysis presenting with confusion and increased weakness. The patient is unable to provide reliable information given mental status history obtained from daughter present at bedside. Apparently he has missed the entire week's worth of dialysis stating that he felt too weak to get into the car suddenly simply did not take him throughout the week he has become increasingly weak as well as confused. The patient denies current symptoms once again is not a reliable historian at this time. No noted shortness of breath, chest pain, fevers  PAST MEDICAL HISTORY:   Past Medical History  Diagnosis Date  . Complication of anesthesia   . Hypertension   . Stroke ([place])   . GERD (gastroesophageal reflux disease)   . Headache   . Kidney dialysis status [id number]  . HLD (hyperlipidemia)   . Diabetes mellitus, type II ([place])   . Seizure ([place])   . BPH (benign prostatic hyperplasia)   . COPD (chronic obstructive pulmonary disease) ([place])   . PVD (peripheral vascular disease) ([place])   . Carotid stenosis   . Adenoma of large intestine   . ESRD on hemodialysis ([place])     MWF  . Anxiety   . RLS (restless legs syndrome)     PAST SURGICAL HISTORY:   Past Surgical History  Procedure Laterality Date  . Shoulder arthroscopy w/ rotator cuff repair Left 1999  . Neck surgery N/A 10/24/2009    Cervical Diskectomy- C3-4, 4-5, 5-6 anterior disckectomy by Dr. Arnoldo Morale at [facility]  . Av fistula placement Left 2 years  . Colonoscopy N/A  06/14/2014    Procedure: COLONOSCOPY;  Surgeon: Hulen Luster, MD;  Location: [facility] ENDOSCOPY;  Service: Gastroenterology;  Laterality: N/A;  . Carotid endarterectomy Right 06/05/2013    Dr. Delana Meyer  . Tonsillectomy and adenoidectomy  1950  . 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 Repeat in 3 years  . Carotid doppler ultrasound  04/22/2013    85-90% stenosis of the RICA, 60-70% stenosis of left cmmon carotid and origin of left subclavian  . Doppler echocardiography  12/04/2011    Bilateral stenosis 80%  . Arch aortogram  04/07/2013    85-90% Stenosis origin of right ICA. 65-70% narrowing of left CCA. 50-60% narrowing of left subclavian artery  . Colonoscopy with propofol N/A 11/11/2014    Procedure: COLONOSCOPY WITH PROPOFOL;  Surgeon: Hulen Luster, MD;  Location: [facility] ENDOSCOPY;  Service: Endoscopy;  Laterality: N/A;  . Esophagogastroduodenoscopy (egd) with propofol N/A 11/11/2014    Procedure: ESOPHAGOGASTRODUODENOSCOPY (EGD) WITH PROPOFOL;  Surgeon: Hulen Luster, MD;  Location: [facility] ENDOSCOPY;  Service: Endoscopy;  Laterality: N/A;  . Peripheral vascular catheterization N/A 04/26/2015    Procedure: A/V Shuntogram/Fistulagram;  Surgeon: Katha Cabal, MD;  Location: [facility] CV LAB;  Service: Cardiovascular;  Laterality: N/A;  . Peripheral vascular catheterization N/A 04/26/2015    Procedure: A/V Shunt Intervention;  Surgeon: Katha Cabal, MD;  Location: [facility]  INVASIVE CV LAB;  Service: Cardiovascular;  Laterality: N/A;    SOCIAL HISTORY:   Social History  Substance Use Topics  . Smoking status: Former Smoker -- 2.00 packs/day for 30 years    Types: Cigarettes    Quit date: 01/29/2002  . Smokeless tobacco: Current User  . Alcohol Use: No    FAMILY HISTORY:   Family History  Problem Relation Age of Onset  . Stroke Mother   . Prostate cancer Father   . Heart disease Sister     DRUG ALLERGIES:   Allergies  Allergen  Reactions  . Sulfa Antibiotics   . Sulfur Hives    REVIEW OF SYSTEMS:  Unable to accurately obtain given patient's mental status  MEDICATIONS AT HOME:   Prior to Admission medications   Medication Sig Start Date End Date Taking? Authorizing Provider  acetaminophen (TYLENOL) 325 MG tablet Take 650 mg by mouth every 6 (six) hours as needed for mild pain or headache.    Historical Provider, MD  albuterol-ipratropium (COMBIVENT) 18-103 MCG/ACT inhaler Inhale 2 puffs into the lungs every 4 (four) hours as needed for wheezing or shortness of breath.    Historical Provider, MD  allopurinol (ZYLOPRIM) 100 MG tablet Take 100 mg by mouth every evening.     Historical Provider, MD  ALPRAZolam Duanne Moron) 0.5 MG tablet Take 1 tablet (0.5 mg total) by mouth at bedtime. 11/12/14   Gladstone Lighter, MD  amLODipine (NORVASC) 10 MG tablet Take 10 mg by mouth daily. Pt does not take on dialysis days.    Historical Provider, MD  atorvastatin (LIPITOR) 40 MG tablet Take 40 mg by mouth every evening.     Historical Provider, MD  azelastine (ASTELIN) 0.1 % nasal spray Place 2 sprays into both nostrils daily as needed for rhinitis.     Historical Provider, MD  cinacalcet (SENSIPAR) 30 MG tablet Take 30 mg by mouth every evening.    Historical Provider, MD  cycloSPORINE (RESTASIS) 0.05 % ophthalmic emulsion Place 1 drop into both eyes 2 (two) times daily. 10/03/14   Theodoro Grist, MD  diphenhydrAMINE (BENADRYL) 25 MG tablet Take 25 mg by mouth at bedtime as needed for itching or sleep.    Historical Provider, MD  esomeprazole (NEXIUM) 40 MG capsule Take 1 capsule (40 mg total) by mouth 2 (two) times daily. 09/06/14   Birdie Sons, MD  ferrous sulfate 325 (65 FE) MG tablet Take 1 tablet (325 mg total) by mouth 2 (two) times daily with a meal. 11/12/14   Gladstone Lighter, MD  FLUoxetine (PROZAC) 20 MG capsule Take 20 mg by mouth daily. 07/13/15   Historical Provider, MD  furosemide (LASIX) 80 MG tablet Take 80 mg by  mouth daily. Pt does not take on dialysis days.    Historical Provider, MD  labetalol (NORMODYNE) 100 MG tablet Take 100 mg by mouth 2 (two) times daily. 07/13/15   Historical Provider, MD  lidocaine-prilocaine (EMLA) cream Apply 1 application topically as needed (before port access).     Historical Provider, MD  loratadine (CLARITIN) 10 MG tablet Take 10 mg by mouth daily as needed for allergies.     Historical Provider, MD  losartan (COZAAR) 25 MG tablet Take 25 mg by mouth every evening.    Historical Provider, MD  meclizine (ANTIVERT) 25 MG tablet Take 25 mg by mouth 2 (two) times daily as needed for dizziness.    Historical Provider, MD  oxyCODONE (OXY IR/ROXICODONE) 5 MG immediate release tablet Take 1 tablet (  5 mg total) by mouth every 4 (four) hours as needed for severe pain. 11/12/14   Gladstone Lighter, MD  polyvinyl alcohol (LIQUIFILM TEARS) 1.4 % ophthalmic solution Place 1 drop into both eyes as needed for dry eyes. 10/07/14   Fritzi Mandes, MD  potassium chloride SA (K-DUR,KLOR-CON) 20 MEQ tablet Take 20 mEq by mouth daily.    Historical Provider, MD  rOPINIRole (REQUIP) 2 MG tablet Take 2 mg by mouth at bedtime.    Historical Provider, MD  sevelamer carbonate (RENVELA) 800 MG tablet Take 2 tablets (1,600 mg total) by mouth 3 (three) times daily with meals. 10/03/14   Theodoro Grist, MD  traMADol-acetaminophen (ULTRACET) 37.5-325 MG tablet Take 1 tablet by mouth every 6 (six) hours as needed for moderate pain. 11/12/14   Gladstone Lighter, MD      VITAL SIGNS:  Blood pressure 162/64, pulse 63, temperature 98.8 F (37.1 C), temperature source Oral, resp. rate 19, height 5\' 9"  (1.753 m), weight 214 lb (97.07 kg), SpO2 93 %.  PHYSICAL EXAMINATION:  VITAL SIGNS: Filed Vitals:   08/05/15 1500 08/05/15 1530  BP: 165/67 162/64  Pulse: 66 63  Temp:    Resp: 21 19   GENERAL:70 y.o.male currently in Moderate acute distress. Given mental status HEAD: Normocephalic, atraumatic.  EYES: Pupils  equal, round, reactive to light. Extraocular muscles intact. No scleral icterus.  MOUTH: Moist mucosal membrane. Dentition intact. No abscess noted.  EAR, NOSE, THROAT: Clear without exudates. No external lesions.  NECK: Supple. No thyromegaly. No nodules. No JVD.  PULMONARY: Diminished breath soundswithout wheeze rails or rhonci. No use of accessory muscles, good respiratory effort. Good air entry bilaterally CHEST: Nontender to palpation.  CARDIOVASCULAR: S1 and S2. Regular rate and rhythm. No murmurs, rubs, or gallops. 1+ edema. Pedal pulses 2+ bilaterally. Good palpable thrill left upper extremity fistula GASTROINTESTINAL: Soft, nontender, nondistended. No masses. Positive bowel sounds. No hepatosplenomegaly.  MUSCULOSKELETAL: No swelling, clubbing, or edema. Range of motion full in all extremities.  NEUROLOGIC: Cranial nerves II through XII are intact. No gross focal neurological deficits. Sensation intact. Reflexes intact.  SKIN: No ulceration, lesions, rashes, or cyanosis. Skin warm and dry. Turgor intact.  PSYCHIATRIC: Mood, affect flat. The patient is awake, alert and oriented x self. Insight, judgment poor.    LABORATORY PANEL:   CBC  Recent Labs Lab 08/05/15 1423  WBC 7.1  HGB 10.2*  HCT 30.0*  PLT 270   ------------------------------------------------------------------------------------------------------------------  Chemistries   Recent Labs Lab 08/03/15 1040 08/05/15 1423  NA 141 141  K 3.7 3.9  CL 108 110  CO2 20* 18*  GLUCOSE 94 111*  BUN 58* 70*  CREATININE 8.31* 9.62*  CALCIUM 9.9 9.3  MG 1.8  --   AST 11* 12*  ALT 10* 10*  ALKPHOS 66 64  BILITOT 0.9 0.5   ------------------------------------------------------------------------------------------------------------------  Cardiac Enzymes  Recent Labs Lab 08/03/15 1344  TROPONINI 0.06*    ------------------------------------------------------------------------------------------------------------------  RADIOLOGY:  Dg Chest Portable 1 View  08/05/2015  CLINICAL DATA:  Dialysis patient, behind on dialysis. EXAM: PORTABLE CHEST 1 VIEW COMPARISON:  08/04/2015 FINDINGS: Enlargement of the cardiac silhouette persists. Mediastinal shadows show aortic atherosclerosis. There is pulmonary venous hypertension and mild interstitial edema. No effusions. No acute bone finding. IMPRESSION: Cardiomegaly and pulmonary venous hypertension. Early interstitial edema. Aortic atherosclerosis. Electronically Signed   By: Nelson Chimes M.D.   On: 08/05/2015 14:34    EKG:   Orders placed or performed during the hospital encounter of 08/05/15  .  ED EKG  . ED EKG  . EKG 12-Lead  . EKG 12-Lead    IMPRESSION AND PLAN:   69 year old Caucasian gentleman history end-stage renal disease on hemodialysis presenting after missing 3 dialysis sessions  1. Uremia: Given increased confusion and weakness, nephrology are contacted planning for restart of hemodialysis 2. Essential hypertension: Restart home medications at as needed hydralazine suspect better control after dialysis 3. GERD without esophagitis: PPI therapy 4. Hyperlipidemia unspecified statin therapy 5. Disposition: Physical therapy evaluation, social worker already involved for likely placement    All the records are reviewed and case discussed with ED provider. Management plans discussed with the patient, family and they are in agreement.  CODE STATUS: Full  TOTAL TIME TAKING CARE OF THIS PATIENT: 45 minutes.    Ghada Abbett,  Karenann Cai.D on 08/05/2015 at 3:58 PM  Between 7am to 6pm - Pager - [phone]  After 6pm: House Pager: - [facility]  Office  [phone]  CC: Primary care physician; Lelon Huh, MD

## 2015-08-05 NOTE — Telephone Encounter (Signed)
Per Dr. Caryn Section call Amy and find out if pt has been taken to ER. I called and left a message for Amy to return our call. sd

## 2015-08-05 NOTE — Care Management Note (Signed)
Case Management Note  Patient Details  Name: Troy Gallagher MRN: [id number] Date of Birth: Dec 07, 1945  Subjective/Objective:   Spoke to daughter Webb Silversmith, by phone at [phone]. She sounds understandably upset, and says that [facility] told she and her sister that Mr nivar could die over the weekend if he did not get HD. I have explained to her that the Hospital dialyses those who need HD critically and not routinely. The labwork will be drawn and then the MD who is here will decide if he need HD. I have also explained that I have tried to call both Otila Kluver and laPortia at [facility] to try to get their take on whether they are going to help the family with care/placement due to the limitations of the daughter who lives with  The patient.               Action/Plan:   Expected Discharge Date:                  Expected Discharge Plan:     In-House Referral:     Discharge planning Services     Post Acute Care Choice:    Choice offered to:     DME Arranged:    DME Agency:     HH Arranged:    HH Agency:     Status of Service:     If discussed at [employer] of Stay Meetings, dates discussed:    Additional Comments:  Beau Fanny, RN 08/05/2015, 2:41 PM

## 2015-08-05 NOTE — Telephone Encounter (Signed)
- 

## 2015-08-05 NOTE — ED Provider Notes (Signed)
[facility] Emergency Department Provider Note  ____________________________________________  Time seen: 2:10 PM  I have reviewed the triage vital signs and the nursing notes.   HISTORY  Chief Complaint Weakness and Failure To Thrive    HPI Troy Gallagher is a 70 y.o. male brought to the emergency department under involuntary commitment petition initiated by the DSS social workerdue to patient's inability to take care of himself. IVC petition also notes that the patient's family member who lives with him is unable to take care of them and they suspect unable to adequately care for herself. There is no indication of any psychiatric illness or substance abuse or any imminent danger to himself or others.  Patient reports generalized weakness. He states the last time he had dialysis was a week ago because he is too weak to go. He is well-known to me and this emergency department. He's been here multiple times in the last few weeks for the same issues. He's been arranged for [facility] which would visit him at home. 2 days ago family was given instructions to call a home care agency nor provided resources to help facilitate this so that the patient can have assistance at home with medication administration and transport to dialysis. Patient's daughter reports that she did not call to arrange this.  Based on the IVC petition and past history of his social issues it appears the patient has been sent to the emergency department due to inability to care for self and inadequate family support at home with the intent to domicile him here until further placement can be obtained.     Past Medical History  Diagnosis Date  . Complication of anesthesia   . Hypertension   . Stroke ([place])   . GERD (gastroesophageal reflux disease)   . Headache   . Kidney dialysis status [id number]  . HLD (hyperlipidemia)   . Diabetes mellitus, type II ([place])   . Seizure ([place])   . BPH  (benign prostatic hyperplasia)   . COPD (chronic obstructive pulmonary disease) ([place])   . PVD (peripheral vascular disease) ([place])   . Carotid stenosis   . Adenoma of large intestine   . ESRD on hemodialysis ([place])     MWF  . Anxiety   . RLS (restless legs syndrome)      Patient Active Problem List   Diagnosis Date Noted  . Uremia 08/05/2015  . History of adenomatous polyp of colon 11/30/2014  . Rotator cuff tear arthropathy of right shoulder 10/03/2014  . Weakness generalized 09/30/2014  . ESRD on hemodialysis ([place]) 09/30/2014  . GERD (gastroesophageal reflux disease) 09/30/2014  . HTN (hypertension) 09/30/2014  . HLD (hyperlipidemia) 09/30/2014  . Type 2 diabetes mellitus ([place]) 09/30/2014  . Anxiety 09/30/2014  . RLS (restless legs syndrome) 09/30/2014  . History of CVA (cerebrovascular accident) 09/28/2014  . Symptoms, musculoskeletal, limb NEC 09/28/2014  . Anemia 09/28/2014  . Hyperlipidemia, mixed 09/28/2014  . Bilateral carotid artery stenosis 12/04/2011  . Depressive disorder 01/29/1998  . Barrett esophagus 01/29/1998     Past Surgical History  Procedure Laterality Date  . Shoulder arthroscopy w/ rotator cuff repair Left 1999  . Neck surgery N/A 10/24/2009    Cervical Diskectomy- C3-4, 4-5, 5-6 anterior disckectomy by Dr. Arnoldo Morale at [facility]  . Av fistula placement Left 2 years  . Colonoscopy N/A 06/14/2014    Procedure: COLONOSCOPY;  Surgeon: Hulen Luster, MD;  Location: [facility] ENDOSCOPY;  Service: Gastroenterology;  Laterality: N/A;  .  Carotid endarterectomy Right 06/05/2013    Dr. Delana Meyer  . Tonsillectomy and adenoidectomy  1950  . 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 Repeat in 3 years  . Carotid doppler ultrasound  04/22/2013    85-90% stenosis of the RICA, 60-70% stenosis of left cmmon carotid and origin of left subclavian  . Doppler echocardiography  12/04/2011    Bilateral stenosis 80%  .  Arch aortogram  04/07/2013    85-90% Stenosis origin of right ICA. 65-70% narrowing of left CCA. 50-60% narrowing of left subclavian artery  . Colonoscopy with propofol N/A 11/11/2014    Procedure: COLONOSCOPY WITH PROPOFOL;  Surgeon: Hulen Luster, MD;  Location: [facility] ENDOSCOPY;  Service: Endoscopy;  Laterality: N/A;  . Esophagogastroduodenoscopy (egd) with propofol N/A 11/11/2014    Procedure: ESOPHAGOGASTRODUODENOSCOPY (EGD) WITH PROPOFOL;  Surgeon: Hulen Luster, MD;  Location: [facility] ENDOSCOPY;  Service: Endoscopy;  Laterality: N/A;  . Peripheral vascular catheterization N/A 04/26/2015    Procedure: A/V Shuntogram/Fistulagram;  Surgeon: Katha Cabal, MD;  Location: [place] CV LAB;  Service: Cardiovascular;  Laterality: N/A;  . Peripheral vascular catheterization N/A 04/26/2015    Procedure: A/V Shunt Intervention;  Surgeon: Katha Cabal, MD;  Location: [place] CV LAB;  Service: Cardiovascular;  Laterality: N/A;     Current Outpatient Rx  Name  Route  Sig  Dispense  Refill  . acetaminophen (TYLENOL) 325 MG tablet   Oral   Take 650 mg by mouth every 6 (six) hours as needed for mild pain or headache.         . albuterol-ipratropium (COMBIVENT) 18-103 MCG/ACT inhaler   Inhalation   Inhale 2 puffs into the lungs every 4 (four) hours as needed for wheezing or shortness of breath.         . allopurinol (ZYLOPRIM) 100 MG tablet   Oral   Take 100 mg by mouth every evening.          Marland Kitchen ALPRAZolam (XANAX) 0.5 MG tablet   Oral   Take 1 tablet (0.5 mg total) by mouth at bedtime.   180 tablet   1   . amLODipine (NORVASC) 10 MG tablet   Oral   Take 10 mg by mouth daily. Pt does not take on dialysis days.         Marland Kitchen atorvastatin (LIPITOR) 40 MG tablet   Oral   Take 40 mg by mouth every evening.          Marland Kitchen azelastine (ASTELIN) 0.1 % nasal spray   Each Nare   Place 2 sprays into both nostrils daily as needed for rhinitis.          . cinacalcet (SENSIPAR) 30 MG  tablet   Oral   Take 30 mg by mouth every evening.         . cycloSPORINE (RESTASIS) 0.05 % ophthalmic emulsion   Both Eyes   Place 1 drop into both eyes 2 (two) times daily.   0.4 mL   3   . diphenhydrAMINE (BENADRYL) 25 MG tablet   Oral   Take 25 mg by mouth at bedtime as needed for itching or sleep.         Marland Kitchen esomeprazole (NEXIUM) 40 MG capsule   Oral   Take 1 capsule (40 mg total) by mouth 2 (two) times daily.   60 capsule   6   . ferrous sulfate 325 (65 FE) MG tablet  Oral   Take 1 tablet (325 mg total) by mouth 2 (two) times daily with a meal.   60 tablet   3   . FLUoxetine (PROZAC) 20 MG capsule   Oral   Take 20 mg by mouth daily.      5   . furosemide (LASIX) 80 MG tablet   Oral   Take 80 mg by mouth daily. Pt does not take on dialysis days.         Marland Kitchen labetalol (NORMODYNE) 100 MG tablet   Oral   Take 100 mg by mouth 2 (two) times daily.         Marland Kitchen lidocaine-prilocaine (EMLA) cream   Topical   Apply 1 application topically as needed (before port access).          Marland Kitchen loratadine (CLARITIN) 10 MG tablet   Oral   Take 10 mg by mouth daily as needed for allergies.          Marland Kitchen losartan (COZAAR) 25 MG tablet   Oral   Take 25 mg by mouth every evening.         . meclizine (ANTIVERT) 25 MG tablet   Oral   Take 25 mg by mouth 2 (two) times daily as needed for dizziness.         Marland Kitchen oxyCODONE (OXY IR/ROXICODONE) 5 MG immediate release tablet   Oral   Take 1 tablet (5 mg total) by mouth every 4 (four) hours as needed for severe pain.   20 tablet   0   . polyvinyl alcohol (LIQUIFILM TEARS) 1.4 % ophthalmic solution   Both Eyes   Place 1 drop into both eyes as needed for dry eyes.   15 mL   0   . potassium chloride SA (K-DUR,KLOR-CON) 20 MEQ tablet   Oral   Take 20 mEq by mouth daily.         Marland Kitchen rOPINIRole (REQUIP) 2 MG tablet   Oral   Take 2 mg by mouth at bedtime.         . sevelamer carbonate (RENVELA) 800 MG tablet   Oral    Take 2 tablets (1,600 mg total) by mouth 3 (three) times daily with meals.   90 tablet   6   . traMADol-acetaminophen (ULTRACET) 37.5-325 MG tablet   Oral   Take 1 tablet by mouth every 6 (six) hours as needed for moderate pain.   40 tablet   0      Allergies Sulfa antibiotics and Sulfur   Family History  Problem Relation Age of Onset  . Stroke Mother   . Prostate cancer Father   . Heart disease Sister     Social History Social History  Substance Use Topics  . Smoking status: Former Smoker -- 2.00 packs/day for 30 years    Types: Cigarettes    Quit date: 01/29/2002  . Smokeless tobacco: Current User  . Alcohol Use: No    Review of Systems  Constitutional:   No fever or chills. Positive for generalized weakness ENT:   No sore throat. No rhinorrhea. Cardiovascular:   No chest pain. Respiratory:   No dyspnea or cough. Gastrointestinal:   Negative for abdominal pain, vomiting and diarrhea.  Musculoskeletal:   Negative for focal pain or swelling  10-point ROS otherwise negative.  ____________________________________________   PHYSICAL EXAM:  VITAL SIGNS: ED Triage Vitals  Enc Vitals Group     BP 08/05/15 1353 163/64 mmHg     Pulse Rate  08/05/15 1353 61     Resp 08/05/15 1353 20     Temp 08/05/15 1353 98.8 F (37.1 C)     Temp Source 08/05/15 1353 Oral     SpO2 08/05/15 1353 92 %     Weight 08/05/15 1353 214 lb (97.07 kg)     Height 08/05/15 1353 5\' 9"  (1.753 m)     Head Cir --      Peak Flow --      Pain Score 08/05/15 1401 5     Pain Loc --      Pain Edu? --      Excl. in [place]? --     Vital signs reviewed, nursing assessments reviewed.   Constitutional:   Alert and oriented 1. Not in distress Eyes:   No scleral icterus. No conjunctival pallor. PERRL. EOMI.  No nystagmus. ENT   Head:   Normocephalic and atraumatic.   Nose:   No congestion/rhinnorhea. No septal hematoma   Mouth/Throat:   Dry mucous membranes, no pharyngeal erythema. No  peritonsillar mass.    Neck:   No stridor. No SubQ emphysema. No meningismus. No JVD Hematological/Lymphatic/Immunilogical:   No cervical lymphadenopathy. Cardiovascular:   RRR. Symmetric bilateral radial and DP pulses.  No murmurs. AV fistula on the left upper extremity has a strong pulse and thrill without any tenderness or inflammatory changes Respiratory:   Normal respiratory effort without tachypnea nor retractions. Breath sounds are clear and equal bilaterally. No wheezes/rales/rhonchi. Gastrointestinal:   Soft and nontender. Non distended. There is no CVA tenderness.  No rebound, rigidity, or guarding. Genitourinary:   deferred Musculoskeletal:   Nontender with normal range of motion in all extremities. No joint effusions.  No lower extremity tenderness.  No edema. Left arm has a combination of plastic food wrap and packing tape encasing the area of the AV fistula with some sort of a white cream applied underneath. This was removed during examination. Neurologic:   Normal speech and language.  CN 2-10 normal. Motor grossly intact. No gross focal neurologic deficits are appreciated.  Skin:    Skin is warm, dry and intact. No rash noted.  No petechiae, purpura, or bullae.  ____________________________________________    LABS (pertinent positives/negatives) (all labs ordered are listed, but only abnormal results are displayed) Labs Reviewed  COMPREHENSIVE METABOLIC PANEL - Abnormal; Notable for the following:    CO2 18 (*)    Glucose, Bld 111 (*)    BUN 70 (*)    Creatinine, Ser 9.62 (*)    AST 12 (*)    ALT 10 (*)    GFR calc non Af Amer 5 (*)    GFR calc Af Amer 6 (*)    All other components within normal limits  CBC WITH DIFFERENTIAL/PLATELET - Abnormal; Notable for the following:    RBC 3.15 (*)    Hemoglobin 10.2 (*)    HCT 30.0 (*)    RDW 14.8 (*)    Lymphs Abs 0.9 (*)    All other components within normal limits    ____________________________________________   EKG  Interpreted by me Sinus rhythm rate of 66, normal axis and intervals. Normal QRS ST segments and T waves.  ____________________________________________    RADIOLOGY  Chest x-ray reveals developing pulmonary vascular congestion and interstitial edema  ____________________________________________   PROCEDURES   ____________________________________________   INITIAL IMPRESSION / ASSESSMENT AND PLAN / ED COURSE  Pertinent labs & imaging results that were available during my care of the patient were reviewed  by me and considered in my medical decision making (see chart for details).  Patient is not in distress. EKG is unremarkable. Clinically not fluid overloaded. We'll check labs to evaluate for any need for emergent dialysis. Patient may eat and drink. I can see no reason to hold the patient involuntarily in the emergency department, and he is not in need of psychiatric evaluation or a danger to himself or others. I have terminated the IVC proceedings.  Social work consult placed to work with DSS to determine housing and home care plan for the patient.  Patient's care discussed with his daughter and by case management, who understands the lack of medical necessity for admission or dialysis unless there are severe lab abnormalities.  ----------------------------------------- 4:02 PM on 08/05/2015 -----------------------------------------    Labs worsening, BUN 70. Chest x-ray suggestive of overloaded. Evaluated by hospitalist for admission.   ____________________________________________   FINAL CLINICAL IMPRESSION(S) / ED DIAGNOSES  Final diagnoses:  ESRD on hemodialysis ([place])  Disorientation  Acute pulmonary edema ([place])       Portions of this note were generated with dragon dictation software. Dictation errors may occur despite best attempts at proofreading.   Carrie Mew, MD 08/05/15 [phone]

## 2015-08-05 NOTE — Care Management Note (Signed)
Case Management Note  Patient Details  Name: TORAO CASPER MRN: [id number] Date of Birth: [birth year]  Subjective/Objective:   Spoke to daughter in lobby. She states the DSS worker Rory Percy told them to bring the pt. Here for HD. I have tried to call her at [phone], with no response so I left a VM. I also asked the daughter   And asked if [facility] , which had  Been arranged two days ago , worked out. She states they never called her back. I esplained to her once again that she needs to pay out of pocket for ADL care for the patient, and she is staring at me as if there   Is a knowledge deficit. It may indeed be that the daughter does not have the capacity to care for this gentleman , for mre reasons than just physical. Pt. To be assessed and we will continue to try to contact Rory Percy. I have also called her supervisor laPortia mcCullough and got no answer on the number [phone].            Action/Plan:   Expected Discharge Date:                  Expected Discharge Plan:     In-House Referral:     Discharge planning Services     Post Acute Care Choice:    Choice offered to:     DME Arranged:    DME Agency:     HH Arranged:    HH Agency:     Status of Service:     If discussed at [employer] of Stay Meetings, dates discussed:    Additional Comments:  Beau Fanny, RN 08/05/2015, 1:55 PM

## 2015-08-05 NOTE — Progress Notes (Signed)
LCSW met with patient and collected data to complete assessment and Fl2 . Met with his home provider ( daughter Amy) They would like patient to get rehab and they struggle to meet this patients needs at home. LCSW called patients guardian Hannah Beat and left a detailed message. Patient will be admitted and we will send out information once pt consult done.   Izabelle Daus LCSW

## 2015-08-05 NOTE — Care Management Note (Signed)
Case Management Note  Patient Details  Name: GLEEN NORTZ MRN: [id number] Date of Birth: [birth year]  Subjective/Objective: Spoke to Rory Percy [phone] one hour ago. She had verified pt. Was no longer in ER. She has described a situation where there is an APS case open presently, but DSS is not actively working on placement , or services because the patient is competent and has refused to be placed when offered.  Although the daughter who lives with the patient is well meaning , she herself does not seem to grasp all the components of their situation. Webb Silversmith , the daughter who lives and works out of town , is able to grasp the severity of their situation and that of the patient. She understands that we cannot force the patient to go into placement. I have explained to both daughters that this may be a situation where they pay out of pocket for help in the home. Since at this point the pt, is no longer able to sit for transport or HD , the family will need help finding a resolution to this problem. They have been told that coming to the hospital for HD is not a regular solution.  Of note they are in danger of being charged by the local sherriff , for abusing the 911 system , in an effort to get assistance with the patient. Rory Percy has said she will check on the patient Monday for his status at that time.               Action/Plan:   Expected Discharge Date:                  Expected Discharge Plan:     In-House Referral:     Discharge planning Services     Post Acute Care Choice:    Choice offered to:     DME Arranged:    DME Agency:     HH Arranged:    HH Agency:     Status of Service:     If discussed at [employer] of Stay Meetings, dates discussed:    Additional Comments:  Beau Fanny, RN 08/05/2015, 7:37 PM

## 2015-08-05 NOTE — ED Notes (Signed)
Pt to ED via EMS from home c/o generalized weakness.  Per EMS pt is dialysis pt M, W, F and has not been this week, shunt left arm.  Pt reports feeling weak at home, denies falls or LOC.  Presents with headache 5/10 pain.  Pt is A&Ox4, speaking in complete and coherent sentences and in NAD at this time.

## 2015-08-05 NOTE — Telephone Encounter (Signed)
Patient's daughter Troy Gallagher called stating that her father was visited by a social worker due to family members making multiple calls to 911. Per Troy Gallagher social worker told her that Troy Gallagher needs to go to the ER. Troy Gallagher reports that EMS has not arrived for pt. Troy Gallagher reports that she can not call EMS anymore due to warning from [employer] and Education officer, museum. Troy Gallagher did give me the name and number of social worker Troy Gallagher [phone].  Troy Gallagher's CB# [id number].  Please advise. sd

## 2015-08-06 LAB — CBC
HEMATOCRIT: 29.1 % — AB (ref 40.0–52.0)
Hemoglobin: 9.8 g/dL — ABNORMAL LOW (ref 13.0–18.0)
MCH: 32.5 pg (ref 26.0–34.0)
MCHC: 33.5 g/dL (ref 32.0–36.0)
MCV: 97 fL (ref 80.0–100.0)
Platelets: 242 10*3/uL (ref 150–440)
RBC: 3 MIL/uL — ABNORMAL LOW (ref 4.40–5.90)
RDW: 14.8 % — AB (ref 11.5–14.5)
WBC: 6.6 10*3/uL (ref 3.8–10.6)

## 2015-08-06 LAB — MRSA PCR SCREENING: MRSA by PCR: NEGATIVE

## 2015-08-06 LAB — BASIC METABOLIC PANEL
Anion gap: 12 (ref 5–15)
BUN: 75 mg/dL — AB (ref 6–20)
CHLORIDE: 111 mmol/L (ref 101–111)
CO2: 18 mmol/L — ABNORMAL LOW (ref 22–32)
Calcium: 9.3 mg/dL (ref 8.9–10.3)
Creatinine, Ser: 9.72 mg/dL — ABNORMAL HIGH (ref 0.61–1.24)
GFR calc Af Amer: 5 mL/min — ABNORMAL LOW (ref >60)
GFR calc non Af Amer: 5 mL/min — ABNORMAL LOW (ref >60)
GLUCOSE: 94 mg/dL (ref 65–99)
POTASSIUM: 3.5 mmol/L (ref 3.5–5.1)
Sodium: 141 mmol/L (ref 135–145)

## 2015-08-06 LAB — PHOSPHORUS: PHOSPHORUS: 7.1 mg/dL — AB (ref 2.5–4.6)

## 2015-08-06 MED ORDER — HEPARIN SODIUM (PORCINE) 1000 UNIT/ML DIALYSIS: 1000.0000 [IU] | INTRAMUSCULAR | Status: DC | PRN

## 2015-08-06 MED ORDER — ALTEPLASE 2 MG IJ SOLR: 2.0000 mg | Freq: Once | INTRAMUSCULAR | Status: DC | PRN

## 2015-08-06 MED ORDER — NEPRO/CARBSTEADY PO LIQD
237.0000 mL | ORAL | Status: DC
  Administered 2015-08-09 – 2015-08-11 (×2): 237 mL via ORAL

## 2015-08-06 MED ORDER — PENTAFLUOROPROP-TETRAFLUOROETH EX AERO
1.0000 "application " | INHALATION_SPRAY | CUTANEOUS | Status: DC | PRN
  Filled 2015-08-06: qty 30

## 2015-08-06 MED ORDER — LIDOCAINE HCL (PF) 1 % IJ SOLN
5.0000 mL | INTRAMUSCULAR | Status: DC | PRN
  Filled 2015-08-06: qty 5

## 2015-08-06 MED ORDER — SODIUM CHLORIDE 0.9 % IV SOLN: 100.0000 mL | INTRAVENOUS | Status: DC | PRN

## 2015-08-06 MED ORDER — EPOETIN ALFA 10000 UNIT/ML IJ SOLN
10000.0000 [IU] | INTRAMUSCULAR | Status: DC
  Administered 2015-08-08 – 2015-08-10 (×2): 10000 [IU] via INTRAVENOUS

## 2015-08-06 MED ORDER — LIDOCAINE-PRILOCAINE 2.5-2.5 % EX CREA: 1.0000 "application " | TOPICAL_CREAM | CUTANEOUS | Status: DC | PRN

## 2015-08-06 NOTE — Clinical Social Work Note (Signed)
Clinical Social Work Assessment  Patient Details  Name: Troy Gallagher MRN: [id number] Date of Birth: 04-29-1945  Date of referral:  08/05/15               Reason for consult:  Facility Placement                Permission sought to share information with:  Family Supports, Customer service manager, Guardian Permission granted to share information::  Yes, Verbal Permission Granted  Name::     Webb Silversmith [phone] Amy [phone]  Agency::  yes  Relationship::  yes  Contact Information:  Laney Potash [phone] Rory Percy [phone]  Housing/Transportation Living arrangements for the past 2 months:  [place] of Information:  Adult Children Patient Interpreter Needed:  None Criminal Activity/Legal Involvement Pertinent to Current Situation/Hospitalization:  No - Comment as needed Significant Relationships:  Adult Children Lives with:  Adult Children Do you feel safe going back to the place where you live?  Yes Need for family participation in patient care:  Yes (Comment)  Care giving concerns: Primary daughter ( Amy) reports she is [facility] and unable to care for her father. She reported she lives with him but cant lift patient up with even her sisters assistance. Caregiver and other daughter report unable to manage their fathers medical needs at home. Patients care giver did not follow through with scheduling in home supports. Daughters reported that there father has refused SNF and hope that DSS can make him go.   Social Worker assessment / plan: LCSW met with patient was disoriented but new self, but not able to report what year it was. Patient has 2 daughters, 1- who resides with him at home but disclosed to LCSW she has cognition and [place] issues and for last week unable to get her father to sit up or walk. Patient is not able to ambulate. He has Hemo-dialysis 3x week ( M-WE-Fr) at [facility] and is attached to Dr Hassell Done. Patient required full assist with his ADL's with  the exception of feeding he can do that himself. Patient has become very weak at home and daughter could not remove him from bed to wheelchair. Rory Percy from DSS-APS is now involved and will follow with patient. Patient has medicare part A/B. Verbal consent was given to speak to daughters and DSS and SNF.  Employment status:  Disabled (Comment on whether or not currently receiving Disability) (End stage Renal HD- attached to [employer]) Insurance information:  Medicare PT Recommendations:  Not assessed at this time Information / Referral to community resources:  [place]  Patient/Family's Response to care: They cant provide care  Patient/Family's Understanding of and Emotional Response to Diagnosis, Current Treatment, and Prognosis: They understand without HD he will be at risk of severe health complication.  Emotional Assessment Appearance:   disheleved Attitude/Demeanor/Rapport:  Lethargic, Inconsistent Affect (typically observed):  Calm Orientation:  Oriented to Self, Fluctuating Orientation (Suspected and/or reported Sundowners), Oriented to Place Alcohol / Substance use:  Not Applicable Psych involvement (Current and /or in the community):  No (Comment)  Discharge Needs  Concerns to be addressed:  Basic Needs, Care Coordination Readmission within the last 30 days:  Yes Current discharge risk:   (Family unable to provide care) Barriers to Discharge:  Continued Medical Work up   [place], [place], LCSW 08/06/2015, 7:57 AM

## 2015-08-06 NOTE — Progress Notes (Signed)
Initial Nutrition Assessment  DOCUMENTATION CODES:   Not applicable  INTERVENTION:   Cater to pt preferences on Renal Diet order Recommend Nepro Shake po daily, each supplement provides 425 kcal and 19 grams protein   NUTRITION DIAGNOSIS:   Inadequate oral intake related to acute illness as evidenced by meal completion < 25%.  GOAL:   Patient will meet greater than or equal to 90% of their needs  MONITOR:   PO intake, Supplement acceptance, Labs, Weight trends, I & O's  REASON FOR ASSESSMENT:   Malnutrition Screening Tool    ASSESSMENT:   Pt with h/o ESRD on HD, admitted with uremia secondary to missed week of HD (3 sessions).   Past Medical History  Diagnosis Date  . Complication of anesthesia   . Hypertension   . Stroke ([place])   . GERD (gastroesophageal reflux disease)   . Headache   . Kidney dialysis status [id number]  . HLD (hyperlipidemia)   . Diabetes mellitus, type II ([place])   . Seizure ([place])   . BPH (benign prostatic hyperplasia)   . COPD (chronic obstructive pulmonary disease) ([place])   . PVD (peripheral vascular disease) ([place])   . Carotid stenosis   . Adenoma of large intestine   . ESRD on hemodialysis ([place])     MWF  . Anxiety   . RLS (restless legs syndrome)     Diet Order:  Diet renal with fluid restriction Fluid restriction:: 1200 mL Fluid; Room service appropriate?: Yes; Fluid consistency:: Thin   Pt ate bites at best of breakfast, reports he will eat later.    Pt groggy on visit.  When RD asked about appetite, pt reports he eats. Pt reports 'yes' usually he eats 3 meals per day.  Unable to clarify much further. Per MST no decrease in appetite PTA. No family at bedside.   Medications: Allopurinol, Ferrous Sulfate, Protonix, Lasix, Renvela, Sensipar Labs: BUN 75, Cre 9.72, Potassium WDL, Na WDL   Gastrointestinal Profile: Last BM:  08/05/2015   Nutrition-Focused Physical Exam Findings:  Unable to complete Nutrition-Focused physical exam at  this time.    Weight Change: Pt reports he does not know his usual dry weight. Per recent ED visits, on 6/27 210lbs and 7/5 212lbs recorded.    Skin:  Reviewed, no issues   Height:   Ht Readings from Last 1 Encounters:  08/05/15 5\' 9"  (1.753 m)    Weight:   Wt Readings from Last 1 Encounters:  08/05/15 214 lb (97.07 kg)   Wt Readings from Last 10 Encounters:  08/05/15 214 lb (97.07 kg)  08/03/15 212 lb 12.8 oz (96.525 kg)  07/26/15 210 lb (95.255 kg)  05/11/15 200 lb (90.719 kg)  04/26/15 216 lb (97.977 kg)  11/12/14 216 lb 4.3 oz (98.1 kg)  10/06/14 225 lb 1.4 oz (102.1 kg)  10/01/14 230 lb 9.6 oz (104.6 kg)  09/30/14 234 lb (106.142 kg)  04/23/14 233 lb (105.688 kg)    Ideal Body Weight:   73kg  BMI:  Body mass index is 31.59 kg/(m^2).  Estimated Nutritional Needs:   Kcal:  1940-2293kcals  Protein:  88-109g protein  Fluid:  UOP+1L  EDUCATION NEEDS:   Education needs no appropriate at this time  Dwyane Luo, RD, LDN Pager [phone] Weekend/On-Call Pager [phone]

## 2015-08-06 NOTE — Progress Notes (Signed)
Post hd tx 

## 2015-08-06 NOTE — Progress Notes (Signed)
Hemodialysis completed. 

## 2015-08-06 NOTE — Progress Notes (Signed)
Patient ID: Troy Gallagher, male   DOB: 1945-07-17, 70 y.o.   MRN: [id number] [employer] [facility] PROGRESS NOTE  Troy Gallagher [id number] DOB: [birth year] DOA: 08/05/2015 PCP: Troy Huh, MD  HPI/Subjective: Called by nurse about him slumping over to the right and being confused and not moving his arm very well. Nursing noted that his respirations are up and these on oxygen.  Objective: Filed Vitals:   08/06/15 0713 08/06/15 0912  BP: 147/48 174/62  Pulse: 65 69  Temp:  98.5 F (36.9 C)  Resp:  32    Filed Weights   08/05/15 1353  Weight: 97.07 kg (214 lb)    ROS: Review of Systems  Respiratory: Negative for sputum production.   Cardiovascular: Negative for chest pain.  Gastrointestinal: Positive for nausea. Negative for abdominal pain.   limited with altered mental status Exam: Physical Exam  HENT:  Nose: No mucosal edema.  Mouth/Throat: No oropharyngeal exudate or posterior oropharyngeal edema.  Eyes: Conjunctivae, EOM and lids are normal. Pupils are equal, round, and reactive to light.  Neck: No JVD present. Carotid bruit is not present. No edema present. No thyroid mass and no thyromegaly present.  Cardiovascular: S1 normal and S2 normal.  Exam reveals no gallop.   No murmur heard. Pulses:      Dorsalis pedis pulses are 2+ on the right side, and 2+ on the left side.  Respiratory: No respiratory distress. He has no wheezes. He has rhonchi in the right lower field and the left lower field. He has no rales.  GI: Soft. Bowel sounds are normal. There is no tenderness.  Musculoskeletal:       Right ankle: He exhibits swelling.       Left ankle: He exhibits swelling.  Lymphadenopathy:    He has no cervical adenopathy.  Neurological: He is alert. No cranial nerve deficit.  Patient follow commands. Able to lift up her arms and legs up off the bed. Power 4 out of 5 bilateral upper and lower extremities  Skin: Skin is warm. No rash noted. Nails show no clubbing.  Psychiatric:   Some confusion      Data Reviewed: Basic Metabolic Panel:  Recent Labs Lab 08/03/15 1040 08/05/15 1423 08/06/15 0402  NA 141 141 141  K 3.7 3.9 3.5  CL 108 110 111  CO2 20* 18* 18*  GLUCOSE 94 111* 94  BUN 58* 70* 75*  CREATININE 8.31* 9.62* 9.72*  CALCIUM 9.9 9.3 9.3  MG 1.8  --   --    Liver Function Tests:  Recent Labs Lab 08/03/15 1040 08/05/15 1423  AST 11* 12*  ALT 10* 10*  ALKPHOS 66 64  BILITOT 0.9 0.5  PROT 6.7 6.7  ALBUMIN 3.6 3.5    Recent Labs Lab 08/03/15 1048  AMMONIA 28   CBC:  Recent Labs Lab 08/03/15 1040 08/05/15 1423 08/06/15 0402  WBC 7.7 7.1 6.6  NEUTROABS 5.8 5.2  --   HGB 10.3* 10.2* 9.8*  HCT 30.2* 30.0* 29.1*  MCV 96.0 95.2 97.0  PLT 270 270 242   Cardiac Enzymes:  Recent Labs Lab 08/03/15 1040 08/03/15 1344  TROPONINI 0.06* 0.06*   CBG:  Recent Labs Lab 08/05/15 1722  GLUCAP 123*    Recent Results (from the past 240 hour(s))  MRSA PCR Screening     Status: None   Collection Time: 08/06/15  6:10 AM  Result Value Ref Range Status   MRSA by PCR NEGATIVE NEGATIVE Final    Comment:  The GeneXpert MRSA Assay (FDA approved for NASAL specimens only), is one component of a comprehensive MRSA colonization surveillance program. It is not intended to diagnose MRSA infection nor to guide or monitor treatment for MRSA infections.      Studies: Dg Chest Portable 1 View  08/05/2015  CLINICAL DATA:  Dialysis patient, behind on dialysis. EXAM: PORTABLE CHEST 1 VIEW COMPARISON:  08/04/2015 FINDINGS: Enlargement of the cardiac silhouette persists. Mediastinal shadows show aortic atherosclerosis. There is pulmonary venous hypertension and mild interstitial edema. No effusions. No acute bone finding. IMPRESSION: Cardiomegaly and pulmonary venous hypertension. Early interstitial edema. Aortic atherosclerosis. Electronically Signed   By: Troy Gallagher M.D.   On: 08/05/2015 14:34    Scheduled Meds: . allopurinol   100 mg Oral QPM  . amLODipine  10 mg Oral Daily  . atorvastatin  40 mg Oral QPM  . cinacalcet  30 mg Oral Q supper  . cycloSPORINE  1 drop Both Eyes BID  . ferrous sulfate  325 mg Oral BID WC  . FLUoxetine  20 mg Oral Daily  . furosemide  80 mg Oral Daily  . heparin  5,000 Units Subcutaneous Q8H  . labetalol  100 mg Oral BID  . losartan  25 mg Oral QPM  . pantoprazole  40 mg Oral Daily  . rOPINIRole  2 mg Oral QHS  . sevelamer carbonate  1,600 mg Oral TID WC  . sodium chloride flush  3 mL Intravenous Q12H    Assessment/Plan:  1. Uremia, acute encephalopathy, weakness, increased respiratory rate. Patient is breathing more comfortably than the 32 respirations that was documented Patient with 20 respirations on my count. I spoke with nephrology Dr. Holley Gallagher to get dialysis done today. I think the patient will feel better after dialysis. I do not think the patient had a stroke.as per initial H&P patient has missed 3 dialysis sessions. 2. Elevated troponin. Likely false positive with renal failure. 3. Essential hypertension continue Norvasc labetalol and losartan 4. Gout on low-dose allopurinol 5. GERD on Protonix 6. Hyperlipidemia unspecified on atorvastatin  Code Status:     Code Status Orders        Start     Ordered   08/05/15 1548  Full code   Continuous     08/05/15 1548    Code Status History    Date Active Date Inactive Code Status Order ID Comments User Context   11/10/2014 12:25 AM 11/12/2014  9:08 PM Full Code [id number]  Lytle Butte, MD ED   10/04/2014 11:43 PM 10/07/2014  8:28 PM Full Code [id number]  Lance Coon, MD Inpatient   09/30/2014  4:54 AM 10/03/2014  6:24 PM Full Code [id number]  Lance Coon, MD Inpatient     Family Communication: left message for Troy Gallagher the patient's daughter. Disposition Plan: to be determined  Consultants:  nephrology  Time spent: 25 minutes  Loletha Grayer  [employer]

## 2015-08-06 NOTE — NC FL2 (Signed)
[place] LEVEL OF CARE SCREENING TOOL     IDENTIFICATION  Patient Name: Troy Gallagher Birthdate: February 14, 1945 Sex: male Admission Date (Current Location): 08/05/2015  [place] and [place] Number:  Engineering geologist and Address:  [facility], [address], [place], [place] [address]      Provider Number: [id number]  Attending Physician Name and Address:  Loletha Grayer, MD  Relative Name and Phone Number:       Current Level of Care: Hospital Recommended Level of Care: [place] Prior Approval Number:    Date Approved/Denied:   PASRR Number:   [id number]  Discharge Plan: SNF    Current Diagnoses: Patient Active Problem List   Diagnosis Date Noted  . Uremia 08/05/2015  . History of adenomatous polyp of colon 11/30/2014  . Rotator cuff tear arthropathy of right shoulder 10/03/2014  . Weakness generalized 09/30/2014  . ESRD on hemodialysis ([place]) 09/30/2014  . GERD (gastroesophageal reflux disease) 09/30/2014  . HTN (hypertension) 09/30/2014  . HLD (hyperlipidemia) 09/30/2014  . Type 2 diabetes mellitus ([place]) 09/30/2014  . Anxiety 09/30/2014  . RLS (restless legs syndrome) 09/30/2014  . History of CVA (cerebrovascular accident) 09/28/2014  . Symptoms, musculoskeletal, limb NEC 09/28/2014  . Anemia 09/28/2014  . Hyperlipidemia, mixed 09/28/2014  . Bilateral carotid artery stenosis 12/04/2011  . Depressive disorder 01/29/1998  . Barrett esophagus 01/29/1998    Orientation RESPIRATION BLADDER Height & Weight     Self  Normal Incontinent Weight: 214 lb (97.07 kg) Height:  5\' 9"  (175.3 cm)  BEHAVIORAL SYMPTOMS/MOOD NEUROLOGICAL BOWEL NUTRITION STATUS      Incontinent Diet (normal)  AMBULATORY STATUS COMMUNICATION OF NEEDS Skin   Extensive Assist Verbally Normal, Other (Comment) (Hemo dialysis cathater left arm)                       Personal Care Assistance Level of Assistance  Bathing, Feeding,  Dressing, Total care Bathing Assistance: Maximum assistance Feeding assistance: Limited assistance Dressing Assistance: Maximum assistance Total Care Assistance: Maximum assistance   Functional Limitations Info  Sight, Hearing, Speech Sight Info: Adequate Hearing Info: Adequate Speech Info: Adequate    SPECIAL CARE FACTORS FREQUENCY   (Hemo dialysis 3x week [facility])                    Contractures      Additional Factors Info  Code Status Code Status Info: Full code             Current Medications (08/06/2015):  This is the current hospital active medication list Current Facility-Administered Medications  Medication Dose Route Frequency Provider Last Rate Last Dose  . acetaminophen (TYLENOL) tablet 650 mg  650 mg Oral Q6H PRN Lytle Butte, MD   650 mg at 08/05/15 2314   Or  . acetaminophen (TYLENOL) suppository 650 mg  650 mg Rectal Q6H PRN Lytle Butte, MD      . allopurinol (ZYLOPRIM) tablet 100 mg  100 mg Oral QPM Lytle Butte, MD   100 mg at 08/05/15 2035  . amLODipine (NORVASC) tablet 10 mg  10 mg Oral Daily Lytle Butte, MD   10 mg at 08/05/15 2036  . atorvastatin (LIPITOR) tablet 40 mg  40 mg Oral QPM Lytle Butte, MD   40 mg at 08/05/15 2035  . azelastine (ASTELIN) 0.1 % nasal spray 2 spray  2 spray Each Nare Daily PRN Lytle Butte, MD      .  cinacalcet (SENSIPAR) tablet 30 mg  30 mg Oral Q supper Lytle Butte, MD   30 mg at 08/05/15 2035  . cycloSPORINE (RESTASIS) 0.05 % ophthalmic emulsion 1 drop  1 drop Both Eyes BID Lytle Butte, MD   1 drop at 08/05/15 2118  . ferrous sulfate tablet 325 mg  325 mg Oral BID WC Lytle Butte, MD   325 mg at 08/05/15 2035  . FLUoxetine (PROZAC) capsule 20 mg  20 mg Oral Daily Lytle Butte, MD   20 mg at 08/05/15 2035  . furosemide (LASIX) tablet 80 mg  80 mg Oral Daily Lytle Butte, MD   80 mg at 08/05/15 2035  . heparin injection 5,000 Units  5,000 Units Subcutaneous Q8H Lytle Butte, MD   5,000 Units at 08/06/15  0606  . hydrALAZINE (APRESOLINE) injection 10 mg  10 mg Intravenous Q4H PRN Lytle Butte, MD   10 mg at 08/06/15 [id number]  . ipratropium-albuterol (DUONEB) 0.5-2.5 (3) MG/3ML nebulizer solution 3 mL  3 mL Inhalation Q4H PRN Lytle Butte, MD      . labetalol (NORMODYNE) tablet 100 mg  100 mg Oral BID Lytle Butte, MD   100 mg at 08/05/15 2115  . lidocaine-prilocaine (EMLA) cream 1 application  1 application Topical PRN Lytle Butte, MD      . loratadine (CLARITIN) tablet 10 mg  10 mg Oral Daily PRN Lytle Butte, MD      . losartan (COZAAR) tablet 25 mg  25 mg Oral QPM Lytle Butte, MD   25 mg at 08/05/15 2035  . morphine 2 MG/ML injection 2 mg  2 mg Intravenous Q4H PRN Lytle Butte, MD      . ondansetron [facility]) tablet 4 mg  4 mg Oral Q6H PRN Lytle Butte, MD       Or  . ondansetron [facility]) injection 4 mg  4 mg Intravenous Q6H PRN Lytle Butte, MD      . oxyCODONE (Oxy IR/ROXICODONE) immediate release tablet 5 mg  5 mg Oral Q4H PRN Lytle Butte, MD      . pantoprazole (PROTONIX) EC tablet 40 mg  40 mg Oral Daily Lytle Butte, MD   40 mg at 08/05/15 2035  . polyvinyl alcohol (LIQUIFILM TEARS) 1.4 % ophthalmic solution 1 drop  1 drop Both Eyes PRN Lytle Butte, MD   1 drop at 08/05/15 2115  . rOPINIRole (REQUIP) tablet 2 mg  2 mg Oral QHS Lytle Butte, MD   2 mg at 08/05/15 2115  . sevelamer carbonate (RENVELA) tablet 1,600 mg  1,600 mg Oral TID WC Lytle Butte, MD      . sodium chloride flush (NS) 0.9 % injection 3 mL  3 mL Intravenous Q12H Lytle Butte, MD   3 mL at 08/05/15 2116     Discharge Medications: Please see discharge summary for a list of discharge medications.  Relevant Imaging Results:  Relevant Lab Results:   Additional Information SSN # [id number]  Joana Reamer, [place]

## 2015-08-06 NOTE — Progress Notes (Signed)
Pre-hd tx 

## 2015-08-06 NOTE — Progress Notes (Signed)
Hemodialysis start 

## 2015-08-06 NOTE — Progress Notes (Signed)
Notified MD of patient leaning towards the right side, BP 174/62 and RR 38. MD present in room. Patient alert. Nephrology notified. Continue to monitor.

## 2015-08-06 NOTE — Progress Notes (Signed)
[facility]  ROUNDING NOTE   Subjective:  Patient well known to [place] from outpatient dialysis. He has missed the last 2 hemodialysis sessions secondary to weakness and the inability to get to the dialysis center. The patient's daughter apparently has been calling [phone] to have the patient moved and picked up. DSS evaluated the patient yesterday and felt that he was unsafe for the home. It is unclear as to whether he is able to sit up for dialysis. In the past he developed weakness necessitating rehabilitation.   Objective:  Vital signs in last 24 hours:  Temp:  [97.8 F (36.6 C)-98.8 F (37.1 C)] 98.5 F (36.9 C) (07/08 0912) Pulse Rate:  [61-69] 69 (07/08 0912) Resp:  [18-32] 32 (07/08 0912) BP: (147-196)/(48-70) 174/62 mmHg (07/08 0912) SpO2:  [92 %-99 %] 98 % (07/08 0912) Weight:  [97.07 kg (214 lb)] 97.07 kg (214 lb) (07/07 1353)  Weight change:  Filed Weights   08/05/15 1353  Weight: 97.07 kg (214 lb)    Intake/Output: I/O last 3 completed shifts: In: 100 [P.O.:100] Out: -    Intake/Output this shift:     Physical Exam: General: NAD, laying in bed, on O2  Head: Normocephalic, atraumatic. Moist oral mucosal membranes  Eyes: Anicteric  Neck: Supple, trachea midline  Lungs:  Basilar rales  Heart: S1S2 no rubs  Abdomen:  Soft, nontender, BS present   Extremities: 1+ peripheral edema.  Neurologic: Nonfocal, moving all four extremities  Skin: No lesions  Access: LUE AVF good bruit    Basic Metabolic Panel:  Recent Labs Lab 08/03/15 1040 08/05/15 1423 08/06/15 0402  NA 141 141 141  K 3.7 3.9 3.5  CL 108 110 111  CO2 20* 18* 18*  GLUCOSE 94 111* 94  BUN 58* 70* 75*  CREATININE 8.31* 9.62* 9.72*  CALCIUM 9.9 9.3 9.3  MG 1.8  --   --     Liver Function Tests:  Recent Labs Lab 08/03/15 1040 08/05/15 1423  AST 11* 12*  ALT 10* 10*  ALKPHOS 66 64  BILITOT 0.9 0.5  PROT 6.7 6.7  ALBUMIN 3.6 3.5   No results for input(s): LIPASE,  AMYLASE in the last 168 hours.  Recent Labs Lab 08/03/15 1048  AMMONIA 28    CBC:  Recent Labs Lab 08/03/15 1040 08/05/15 1423 08/06/15 0402  WBC 7.7 7.1 6.6  NEUTROABS 5.8 5.2  --   HGB 10.3* 10.2* 9.8*  HCT 30.2* 30.0* 29.1*  MCV 96.0 95.2 97.0  PLT 270 270 242    Cardiac Enzymes:  Recent Labs Lab 08/03/15 1040 08/03/15 1344  TROPONINI 0.06* 0.06*    BNP: Invalid input(s): POCBNP  CBG:  Recent Labs Lab 08/05/15 1722  GLUCAP 123*    Microbiology: Results for orders placed or performed during the hospital encounter of 08/05/15  MRSA PCR Screening     Status: None   Collection Time: 08/06/15  6:10 AM  Result Value Ref Range Status   MRSA by PCR NEGATIVE NEGATIVE Final    Comment:        The GeneXpert MRSA Assay (FDA approved for NASAL specimens only), is one component of a comprehensive MRSA colonization surveillance program. It is not intended to diagnose MRSA infection nor to guide or monitor treatment for MRSA infections.     Coagulation Studies: No results for input(s): LABPROT, INR in the last 72 hours.  Urinalysis: No results for input(s): COLORURINE, LABSPEC, PHURINE, GLUCOSEU, HGBUR, BILIRUBINUR, KETONESUR, PROTEINUR, UROBILINOGEN, NITRITE, LEUKOCYTESUR in the  last 72 hours.  Invalid input(s): APPERANCEUR    Imaging: Dg Chest Portable 1 View  08/05/2015  CLINICAL DATA:  Dialysis patient, behind on dialysis. EXAM: PORTABLE CHEST 1 VIEW COMPARISON:  08/04/2015 FINDINGS: Enlargement of the cardiac silhouette persists. Mediastinal shadows show aortic atherosclerosis. There is pulmonary venous hypertension and mild interstitial edema. No effusions. No acute bone finding. IMPRESSION: Cardiomegaly and pulmonary venous hypertension. Early interstitial edema. Aortic atherosclerosis. Electronically Signed   By: Nelson Chimes M.D.   On: 08/05/2015 14:34     Medications:     . allopurinol  100 mg Oral QPM  . amLODipine  10 mg Oral Daily  .  atorvastatin  40 mg Oral QPM  . cinacalcet  30 mg Oral Q supper  . cycloSPORINE  1 drop Both Eyes BID  . ferrous sulfate  325 mg Oral BID WC  . FLUoxetine  20 mg Oral Daily  . furosemide  80 mg Oral Daily  . heparin  5,000 Units Subcutaneous Q8H  . labetalol  100 mg Oral BID  . losartan  25 mg Oral QPM  . pantoprazole  40 mg Oral Daily  . rOPINIRole  2 mg Oral QHS  . sevelamer carbonate  1,600 mg Oral TID WC  . sodium chloride flush  3 mL Intravenous Q12H   acetaminophen **OR** acetaminophen, azelastine, hydrALAZINE, ipratropium-albuterol, lidocaine-prilocaine, loratadine, morphine injection, ondansetron **OR** ondansetron (ZOFRAN) IV, oxyCODONE, polyvinyl alcohol  Assessment/ Plan:  70 y.o. male  with end-stage renal disease on hemodialysis Monday, Wednesday, Friday, hypertension, COPD, GERD, gout, left upper extremity AV fistula, restless leg syndrome, hyperlipidemia  [facility]/MWF  1. ESRD on HD MWF. Patient missed 2 dialysis sessions prior to admission secondary to weakness. Patient has presented similarly in the past. Felt to be unsafe for home by DSS. - Patient in need of dialysis. We will perform a conservative dialysis treatment today. We will likely perform dialysis tomorrow. Unclear as to whether he will be able to sit up for dialysis at the moment.  2. Anemia of chronic kidney disease. Hemoglobin currently 9.8. Start the patient on Epogen 10,000 units IV with dialysis.  3. Secondary hyperparathyroidism. Check intact PTH and phosphorus with dialysis. Continue Sensipar as well as Renvela at their current doses.  4. Hypertension. Continue amlodipine, labetalol, losartan.  5. Generalized weakness. The patient has had this issue in the past. He appears to be quite weak at the moment. He will likely require placement with rehabilitation if he is able to sit up for dialysis. If not we may need to consider LTAC placement.   LOS: 1 Bazil Dhanani 7/8/201711:14 AM   d

## 2015-08-06 NOTE — Clinical Social Work Note (Signed)
CSW spoke with nephrology today regarding concern that patient is not able to sit up for dialysis. Nephrology stated that it was too soon to test whether patient could tolerate sitting up for HD in a chair right now and that he was going to give him a couple of days before he attempted. Nephrology stated that patient had missed going to his outpt dialysis appts because his daughter was unable to get him out of bed to transport him. CSW will continue to follow. Shela Leff MSW,LCSW [phone]

## 2015-08-06 NOTE — Progress Notes (Signed)
   08/06/15 1148  PT Visit Information  Last PT Received On 08/06/15  Reason Eval/Treat Not Completed Patient at procedure or test/unavailable  Patient in dialysis. Will re-attempt PT eval once pt is back in room and appropriate for therapy.  Hillis Range, PT, DPT 08/06/15  11:48 AM

## 2015-08-07 LAB — PARATHYROID HORMONE, INTACT (NO CA): PTH: 196 pg/mL — ABNORMAL HIGH (ref 15–65)

## 2015-08-07 LAB — HEPATITIS B SURFACE ANTIGEN: Hepatitis B Surface Ag: NEGATIVE

## 2015-08-07 NOTE — Progress Notes (Signed)
[facility]  ROUNDING NOTE   Subjective:  Patient completed dialysis yesterday. Overall he still appears to be weak. He will need placement in a rehabilitation facility given his weakness.   Objective:  Vital signs in last 24 hours:  Temp:  [98.1 F (36.7 C)-99 F (37.2 C)] 98.4 F (36.9 C) (07/09 0800) Pulse Rate:  [56-68] 68 (07/09 0800) Resp:  [16-20] 16 (07/09 0800) BP: (145-204)/(57-68) 191/59 mmHg (07/09 0800) SpO2:  [96 %-100 %] 96 % (07/09 0800) Weight:  [95.5 kg (210 lb 8.6 oz)] 95.5 kg (210 lb 8.6 oz) (07/08 1400)  Weight change: -0.07 kg (-2.5 oz) Filed Weights   08/05/15 1353 08/06/15 1145 08/06/15 1400  Weight: 97.07 kg (214 lb) 97 kg (213 lb 13.5 oz) 95.5 kg (210 lb 8.6 oz)    Intake/Output: I/O last 3 completed shifts: In: 370 [P.O.:370] Out: 1500 [Other:1500]   Intake/Output this shift:     Physical Exam: General: NAD, laying in bed  Head: Normocephalic, atraumatic. Moist oral mucosal membranes  Eyes: Anicteric  Neck: Supple, trachea midline  Lungs:  Basilar rales, normal effort   Heart: S1S2 no rubs  Abdomen:  Soft, nontender, BS present   Extremities: 1+ peripheral edema.  Neurologic: Nonfocal, moving all four extremities  Skin: No lesions  Access: LUE AVF good bruit    Basic Metabolic Panel:  Recent Labs Lab 08/03/15 1040 08/05/15 1423 08/06/15 0402 08/06/15 1222  NA 141 141 141  --   K 3.7 3.9 3.5  --   CL 108 110 111  --   CO2 20* 18* 18*  --   GLUCOSE 94 111* 94  --   BUN 58* 70* 75*  --   CREATININE 8.31* 9.62* 9.72*  --   CALCIUM 9.9 9.3 9.3  --   MG 1.8  --   --   --   PHOS  --   --   --  7.1*    Liver Function Tests:  Recent Labs Lab 08/03/15 1040 08/05/15 1423  AST 11* 12*  ALT 10* 10*  ALKPHOS 66 64  BILITOT 0.9 0.5  PROT 6.7 6.7  ALBUMIN 3.6 3.5   No results for input(s): LIPASE, AMYLASE in the last 168 hours.  Recent Labs Lab 08/03/15 1048  AMMONIA 28    CBC:  Recent Labs Lab  08/03/15 1040 08/05/15 1423 08/06/15 0402  WBC 7.7 7.1 6.6  NEUTROABS 5.8 5.2  --   HGB 10.3* 10.2* 9.8*  HCT 30.2* 30.0* 29.1*  MCV 96.0 95.2 97.0  PLT 270 270 242    Cardiac Enzymes:  Recent Labs Lab 08/03/15 1040 08/03/15 1344  TROPONINI 0.06* 0.06*    BNP: Invalid input(s): POCBNP  CBG:  Recent Labs Lab 08/05/15 1722  GLUCAP 123*    Microbiology: Results for orders placed or performed during the hospital encounter of 08/05/15  MRSA PCR Screening     Status: None   Collection Time: 08/06/15  6:10 AM  Result Value Ref Range Status   MRSA by PCR NEGATIVE NEGATIVE Final    Comment:        The GeneXpert MRSA Assay (FDA approved for NASAL specimens only), is one component of a comprehensive MRSA colonization surveillance program. It is not intended to diagnose MRSA infection nor to guide or monitor treatment for MRSA infections.     Coagulation Studies: No results for input(s): LABPROT, INR in the last 72 hours.  Urinalysis: No results for input(s): COLORURINE, LABSPEC, [place], [place], [place], [place],  KETONESUR, PROTEINUR, UROBILINOGEN, NITRITE, LEUKOCYTESUR in the last 72 hours.  Invalid input(s): APPERANCEUR    Imaging: Dg Chest Portable 1 View  08/05/2015  CLINICAL DATA:  Dialysis patient, behind on dialysis. EXAM: PORTABLE CHEST 1 VIEW COMPARISON:  08/04/2015 FINDINGS: Enlargement of the cardiac silhouette persists. Mediastinal shadows show aortic atherosclerosis. There is pulmonary venous hypertension and mild interstitial edema. No effusions. No acute bone finding. IMPRESSION: Cardiomegaly and pulmonary venous hypertension. Early interstitial edema. Aortic atherosclerosis. Electronically Signed   By: Nelson Chimes M.D.   On: 08/05/2015 14:34     Medications:     . allopurinol  100 mg Oral QPM  . amLODipine  10 mg Oral Daily  . atorvastatin  40 mg Oral QPM  . cinacalcet  30 mg Oral Q supper  . cycloSPORINE  1 drop Both Eyes BID  .  [START ON 08/08/2015] epoetin (EPOGEN/PROCRIT) injection  10,000 Units Intravenous Q M,W,F-HD  . feeding supplement (NEPRO CARB STEADY)  237 mL Oral Q24H  . ferrous sulfate  325 mg Oral BID WC  . FLUoxetine  20 mg Oral Daily  . furosemide  80 mg Oral Daily  . heparin  5,000 Units Subcutaneous Q8H  . labetalol  100 mg Oral BID  . losartan  25 mg Oral QPM  . pantoprazole  40 mg Oral Daily  . rOPINIRole  2 mg Oral QHS  . sevelamer carbonate  1,600 mg Oral TID WC  . sodium chloride flush  3 mL Intravenous Q12H   acetaminophen **OR** acetaminophen, azelastine, hydrALAZINE, ipratropium-albuterol, lidocaine-prilocaine, loratadine, morphine injection, ondansetron **OR** ondansetron (ZOFRAN) IV, oxyCODONE, polyvinyl alcohol  Assessment/ Plan:  70 y.o. male  with end-stage renal disease on hemodialysis Monday, Wednesday, Friday, hypertension, COPD, GERD, gout, left upper extremity AV fistula, restless leg syndrome, hyperlipidemia  [facility]MWF  1. ESRD on HD MWF. Patient missed 2 dialysis sessions prior to admission secondary to weakness. Patient has presented similarly in the past. Felt to be unsafe for home by DSS. - Patient had hemodialysis yesterday and tolerated well. No urgent indication for dialysis at the moment. We will plan for additional dialysis treatment tomorrow. He will need placement in a rehabilitation facility. In addition would recommend consideration of having him sit up for dialysis sometime next week.  2. Anemia of chronic kidney disease. Continue Epogen with dialysis.  3. Secondary hyperparathyroidism. Phosphorus quite high at 7.1 which is secondary to missed dialysis. For now continue Renvela 1600 mg by mouth 3 times a day and continue to periodically monitor bone mineral metabolism parameters.  4. Hypertension. Blood pressure quite labile and has been between 145/57-191/59.  Continue amlodipine, labetalol, losartan.  5. Generalized weakness. The patient has had this  issue in the past. He appears to be quite weak at the moment. He will likely require placement with rehabilitation if he is able to sit up for dialysis. If not we may need to consider LTAC placement.   LOS: 2 Munir Victorian 7/9/201711:45 AM   d

## 2015-08-07 NOTE — Clinical Social Work Note (Signed)
CSW consulted for New SNF. PT eval is pending. Assessment is complete. Will continue to follow.   Darden Dates, MSW, LCSW  Clinical Social Worker  [phone]

## 2015-08-07 NOTE — Progress Notes (Signed)
Patient ID: Troy Gallagher, male   DOB: 08/05/1945, 70 y.o.   MRN: [id number] [facility] PROGRESS NOTE  KEYMONI KAMRADT [id number] DOB: [birth year] DOA: 08/05/2015 PCP: Lelon Huh, MD  HPI/Subjective: Patient seen this morning and he is feeling better than yesterday. Last night he vomited 5 or 6 times. This morning he is eating breakfast. Patient has a history of stroke with right-sided weakness. Spoke with daughter at the bedside and they could not get him out of the bed at home and that's why he missed 3 dialysis sessions. He has not walked well for many months now.  Objective: Filed Vitals:   08/07/15 0523 08/07/15 0800  BP: 166/68 191/59  Pulse:  68  Temp:  98.4 F (36.9 C)  Resp:  16    Filed Weights   08/05/15 1353 08/06/15 1145 08/06/15 1400  Weight: 97.07 kg (214 lb) 97 kg (213 lb 13.5 oz) 95.5 kg (210 lb 8.6 oz)    ROS: Review of Systems  Constitutional: Negative for fever and chills.  Eyes: Negative for blurred vision.  Respiratory: Negative for cough and shortness of breath.   Cardiovascular: Negative for chest pain.  Gastrointestinal: Negative for nausea, vomiting, abdominal pain, diarrhea and constipation.  Genitourinary: Negative for dysuria.  Musculoskeletal: Negative for joint pain.  Neurological: Negative for dizziness and headaches.    Exam: Physical Exam  Constitutional: He is oriented to person, place, and time.  HENT:  Nose: No mucosal edema.  Mouth/Throat: No oropharyngeal exudate or posterior oropharyngeal edema.  Eyes: Conjunctivae, EOM and lids are normal. Pupils are equal, round, and reactive to light.  Neck: No JVD present. Carotid bruit is not present. No edema present. No thyroid mass and no thyromegaly present.  Cardiovascular: S1 normal and S2 normal.  Exam reveals no gallop.   No murmur heard. Pulses:      Dorsalis pedis pulses are 2+ on the right side, and 2+ on the left side.  Respiratory: No respiratory distress. He has no wheezes.  He has no rhonchi. He has no rales.  GI: Soft. Bowel sounds are normal. There is no tenderness.  Musculoskeletal:       Right ankle: He exhibits swelling.       Left ankle: He exhibits swelling.  Lymphadenopathy:    He has no cervical adenopathy.  Neurological: He is alert and oriented to person, place, and time. No cranial nerve deficit.  Patient power 4 out of 5 right lower extremity. 5 out of 5 upper and left lower extremity.  Skin: Skin is warm. No rash noted. Nails show no clubbing.  Psychiatric: He has a normal mood and affect.      Data Reviewed: Basic Metabolic Panel:  Recent Labs Lab 08/03/15 1040 08/05/15 1423 08/06/15 0402 08/06/15 1222  NA 141 141 141  --   K 3.7 3.9 3.5  --   CL 108 110 111  --   CO2 20* 18* 18*  --   GLUCOSE 94 111* 94  --   BUN 58* 70* 75*  --   CREATININE 8.31* 9.62* 9.72*  --   CALCIUM 9.9 9.3 9.3  --   MG 1.8  --   --   --   PHOS  --   --   --  7.1*   Liver Function Tests:  Recent Labs Lab 08/03/15 1040 08/05/15 1423  AST 11* 12*  ALT 10* 10*  ALKPHOS 66 64  BILITOT 0.9 0.5  PROT 6.7 6.7  ALBUMIN 3.6  3.5    Recent Labs Lab 08/03/15 1048  AMMONIA 28   CBC:  Recent Labs Lab 08/03/15 1040 08/05/15 1423 08/06/15 0402  WBC 7.7 7.1 6.6  NEUTROABS 5.8 5.2  --   HGB 10.3* 10.2* 9.8*  HCT 30.2* 30.0* 29.1*  MCV 96.0 95.2 97.0  PLT 270 270 242   Cardiac Enzymes:  Recent Labs Lab 08/03/15 1040 08/03/15 1344  TROPONINI 0.06* 0.06*   CBG:  Recent Labs Lab 08/05/15 1722  GLUCAP 123*    Recent Results (from the past 240 hour(s))  MRSA PCR Screening     Status: None   Collection Time: 08/06/15  6:10 AM  Result Value Ref Range Status   MRSA by PCR NEGATIVE NEGATIVE Final    Comment:        The GeneXpert MRSA Assay (FDA approved for NASAL specimens only), is one component of a comprehensive MRSA colonization surveillance program. It is not intended to diagnose MRSA infection nor to guide or monitor  treatment for MRSA infections.      Studies: Dg Chest Portable 1 View  08/05/2015  CLINICAL DATA:  Dialysis patient, behind on dialysis. EXAM: PORTABLE CHEST 1 VIEW COMPARISON:  08/04/2015 FINDINGS: Enlargement of the cardiac silhouette persists. Mediastinal shadows show aortic atherosclerosis. There is pulmonary venous hypertension and mild interstitial edema. No effusions. No acute bone finding. IMPRESSION: Cardiomegaly and pulmonary venous hypertension. Early interstitial edema. Aortic atherosclerosis. Electronically Signed   By: Nelson Chimes M.D.   On: 08/05/2015 14:34    Scheduled Meds: . allopurinol  100 mg Oral QPM  . amLODipine  10 mg Oral Daily  . atorvastatin  40 mg Oral QPM  . cinacalcet  30 mg Oral Q supper  . cycloSPORINE  1 drop Both Eyes BID  . [START ON 08/08/2015] epoetin (EPOGEN/PROCRIT) injection  10,000 Units Intravenous Q M,W,F-HD  . feeding supplement (NEPRO CARB STEADY)  237 mL Oral Q24H  . ferrous sulfate  325 mg Oral BID WC  . FLUoxetine  20 mg Oral Daily  . furosemide  80 mg Oral Daily  . heparin  5,000 Units Subcutaneous Q8H  . labetalol  100 mg Oral BID  . losartan  25 mg Oral QPM  . pantoprazole  40 mg Oral Daily  . rOPINIRole  2 mg Oral QHS  . sevelamer carbonate  1,600 mg Oral TID WC  . sodium chloride flush  3 mL Intravenous Q12H    Assessment/Plan:  1. Uremia, acute encephalopathy, weakness, increased respiratory rate. Patient Doing better after dialysis yesterday. Nursing staff waiting to hear from nephrology whether they are going to dialyze today again. Normal dialysis is Monday Wednesday and Friday.  2. Elevated troponin. Likely false positive with renal failure. 3. Accelerated hypertension continue Norvasc labetalol and losartan 4. Gout on low-dose allopurinol 5. GERD on Protonix 6. Hyperlipidemia unspecified on atorvastatin 7. Weakness. We'll get physical therapy evaluation. May need placement since he is unable to get out of his bed at home  and gets at dialysis. 8. History of stroke in the past with baseline right leg weakness.  Code Status:     Code Status Orders        Start     Ordered   08/05/15 1548  Full code   Continuous     08/05/15 1548    Code Status History    Date Active Date Inactive Code Status Order ID Comments User Context   11/10/2014 12:25 AM 11/12/2014  9:08 PM Full Code [id number]  Lytle Butte, MD ED   10/04/2014 11:43 PM 10/07/2014  8:28 PM Full Code [id number]  Lance Coon, MD Inpatient   09/30/2014  4:54 AM 10/03/2014  6:24 PM Full Code [id number]  Lance Coon, MD Inpatient     Family Communication: Spoke with daughter Helene Kelp at the bedside and she is going to be the primary contact Disposition Plan: to be determined based on physical therapy evaluation  Consultants:  nephrology  Time spent: 25 minutes  Loletha Grayer  [employer]

## 2015-08-07 NOTE — Progress Notes (Signed)
PT Cancellation Note  Patient Details Name: Troy Gallagher MRN: [id number] DOB: 18-Aug-1945   Cancelled Treatment:    Reason Eval/Treat Not Completed: Medical issues which prohibited therapy.  Will check pt tomorrow.   [name] 08/07/2015, 12:34 PM   [name], PT MS Acute Rehab Dept. Number: [place]

## 2015-08-08 ENCOUNTER — Ambulatory Visit: Payer: Self-pay | Admitting: Family Medicine

## 2015-08-08 LAB — BASIC METABOLIC PANEL
ANION GAP: 11 (ref 5–15)
BUN: 61 mg/dL — ABNORMAL HIGH (ref 6–20)
CALCIUM: 9.5 mg/dL (ref 8.9–10.3)
CO2: 23 mmol/L (ref 22–32)
CREATININE: 8.85 mg/dL — AB (ref 0.61–1.24)
Chloride: 105 mmol/L (ref 101–111)
GFR calc Af Amer: 6 mL/min — ABNORMAL LOW (ref >60)
GFR, EST NON AFRICAN AMERICAN: 5 mL/min — AB (ref >60)
GLUCOSE: 89 mg/dL (ref 65–99)
Potassium: 3.4 mmol/L — ABNORMAL LOW (ref 3.5–5.1)
Sodium: 139 mmol/L (ref 135–145)

## 2015-08-08 LAB — CBC
HCT: 30.3 % — ABNORMAL LOW (ref 40.0–52.0)
HEMOGLOBIN: 10.1 g/dL — AB (ref 13.0–18.0)
MCH: 31.8 pg (ref 26.0–34.0)
MCHC: 33.4 g/dL (ref 32.0–36.0)
MCV: 95.4 fL (ref 80.0–100.0)
PLATELETS: 274 10*3/uL (ref 150–440)
RBC: 3.17 MIL/uL — ABNORMAL LOW (ref 4.40–5.90)
RDW: 14.7 % — AB (ref 11.5–14.5)
WBC: 6.1 10*3/uL (ref 3.8–10.6)

## 2015-08-08 LAB — CK: CK TOTAL: 80 U/L (ref 49–397)

## 2015-08-08 MED ORDER — PANTOPRAZOLE SODIUM 40 MG PO TBEC
40.0000 mg | DELAYED_RELEASE_TABLET | Freq: Two times a day (BID) | ORAL | Status: DC
  Administered 2015-08-08 – 2015-08-11 (×6): 40 mg via ORAL
  Filled 2015-08-08 (×6): qty 1

## 2015-08-08 MED ORDER — TRAMADOL HCL 50 MG PO TABS
50.0000 mg | ORAL_TABLET | Freq: Once | ORAL | Status: AC
Start: 2015-08-08 — End: 2015-08-08
  Administered 2015-08-08: 50 mg via ORAL
  Filled 2015-08-08: qty 1

## 2015-08-08 NOTE — Progress Notes (Signed)
PT Cancellation Note  Patient Details Name: Troy Gallagher MRN: [id number] DOB: 1945-10-07   Cancelled Treatment:    Reason Eval/Treat Not Completed: Patient at procedure or test/unavailable. Patient is currently at dialysis and unavailable for treatment. PT will continue to attempt mobility evaluation as patient is available and appropriate.   Kerman Passey, PT, DPT    08/08/2015, 12:48 PM

## 2015-08-08 NOTE — Care Management Important Message (Signed)
Important Message  Patient Details  Name: Troy Gallagher MRN: [id number] Date of Birth: August 07, 1945   Medicare Important Message Given:  Yes    Beverly Sessions, RN 08/08/2015, 3:21 PM

## 2015-08-08 NOTE — Progress Notes (Signed)
[facility]  ROUNDING NOTE   Subjective:   Seen and examined on hemodialysis. Complains of headache. Laying in bed  UF goal of 1.5 litres  Objective:  Vital signs in last 24 hours:  Temp:  [98.2 F (36.8 C)-98.4 F (36.9 C)] 98.2 F (36.8 C) (07/10 0939) Pulse Rate:  [59-72] 59 (07/10 1100) Resp:  [15-25] 15 (07/10 1100) BP: (153-193)/(55-141) 171/67 mmHg (07/10 1100) SpO2:  [93 %-97 %] 97 % (07/10 1100) Weight:  [92 kg (202 lb 13.2 oz)] 92 kg (202 lb 13.2 oz) (07/10 0939)  Weight change:  Filed Weights   08/06/15 1145 08/06/15 1400 08/08/15 0939  Weight: 97 kg (213 lb 13.5 oz) 95.5 kg (210 lb 8.6 oz) 92 kg (202 lb 13.2 oz)    Intake/Output: I/O last 3 completed shifts: In: 300 [P.O.:300] Out: 800 [Emesis/NG output:800]   Intake/Output this shift:     Physical Exam: General: NAD, laying in bed  Head: Normocephalic, atraumatic. Moist oral mucosal membranes  Eyes: Anicteric, PERRL  Neck: Supple, trachea midline  Lungs:  Clear to auscultation  Heart: Regular rate and rhythm  Abdomen:  Soft, nontender,   Extremities:  + peripheral edema.  Neurologic: Nonfocal, moving all four extremities  Skin: No lesions  Access: Left arm AVF    Basic Metabolic Panel:  Recent Labs Lab 08/03/15 1040 08/05/15 1423 08/06/15 0402 08/06/15 1222 08/08/15 0451  NA 141 141 141  --  139  K 3.7 3.9 3.5  --  3.4*  CL 108 110 111  --  105  CO2 20* 18* 18*  --  23  GLUCOSE 94 111* 94  --  89  BUN 58* 70* 75*  --  61*  CREATININE 8.31* 9.62* 9.72*  --  8.85*  CALCIUM 9.9 9.3 9.3  --  9.5  MG 1.8  --   --   --   --   PHOS  --   --   --  7.1*  --     Liver Function Tests:  Recent Labs Lab 08/03/15 1040 08/05/15 1423  AST 11* 12*  ALT 10* 10*  ALKPHOS 66 64  BILITOT 0.9 0.5  PROT 6.7 6.7  ALBUMIN 3.6 3.5   No results for input(s): LIPASE, AMYLASE in the last 168 hours.  Recent Labs Lab 08/03/15 1048  AMMONIA 28    CBC:  Recent Labs Lab  08/03/15 1040 08/05/15 1423 08/06/15 0402  WBC 7.7 7.1 6.6  NEUTROABS 5.8 5.2  --   HGB 10.3* 10.2* 9.8*  HCT 30.2* 30.0* 29.1*  MCV 96.0 95.2 97.0  PLT 270 270 242    Cardiac Enzymes:  Recent Labs Lab 08/03/15 1040 08/03/15 1344 08/08/15 0451  CKTOTAL  --   --  80  TROPONINI 0.06* 0.06*  --     BNP: Invalid input(s): POCBNP  CBG:  Recent Labs Lab 08/05/15 1722  GLUCAP 123*    Microbiology: Results for orders placed or performed during the hospital encounter of 08/05/15  MRSA PCR Screening     Status: None   Collection Time: 08/06/15  6:10 AM  Result Value Ref Range Status   MRSA by PCR NEGATIVE NEGATIVE Final    Comment:        The GeneXpert MRSA Assay (FDA approved for NASAL specimens only), is one component of a comprehensive MRSA colonization surveillance program. It is not intended to diagnose MRSA infection nor to guide or monitor treatment for MRSA infections.     Coagulation Studies:  No results for input(s): LABPROT, INR in the last 72 hours.  Urinalysis: No results for input(s): COLORURINE, LABSPEC, PHURINE, GLUCOSEU, HGBUR, BILIRUBINUR, KETONESUR, PROTEINUR, UROBILINOGEN, NITRITE, LEUKOCYTESUR in the last 72 hours.  Invalid input(s): APPERANCEUR    Imaging: No results found.   Medications:     . allopurinol  100 mg Oral QPM  . amLODipine  10 mg Oral Daily  . atorvastatin  40 mg Oral QPM  . cinacalcet  30 mg Oral Q supper  . cycloSPORINE  1 drop Both Eyes BID  . epoetin (EPOGEN/PROCRIT) injection  10,000 Units Intravenous Q M,W,F-HD  . feeding supplement (NEPRO CARB STEADY)  237 mL Oral Q24H  . ferrous sulfate  325 mg Oral BID WC  . FLUoxetine  20 mg Oral Daily  . furosemide  80 mg Oral Daily  . heparin  5,000 Units Subcutaneous Q8H  . labetalol  100 mg Oral BID  . losartan  25 mg Oral QPM  . pantoprazole  40 mg Oral Daily  . rOPINIRole  2 mg Oral QHS  . sevelamer carbonate  1,600 mg Oral TID WC  . sodium chloride flush  3  mL Intravenous Q12H   acetaminophen **OR** acetaminophen, azelastine, hydrALAZINE, ipratropium-albuterol, lidocaine-prilocaine, loratadine, morphine injection, ondansetron **OR** ondansetron (ZOFRAN) IV, oxyCODONE, polyvinyl alcohol  Assessment/ Plan:  Troy Gallagher is a 69 y.o. white male with a history of long-standing hypertension, gout, CVA, diabetes mellitus type II, BPH, prostate problems, Barrett's esophagus/peptic ulcer disease  [facility] MWF [facility]  1. End Stage Renal Disease: MWF schedule. Seen and examined on dialysis. Tolerating well. Plan on dialysis in chair on next treatment.   2. Hypertension: elevated on treatment.  - amlodipine, furosemide, labetalol and losartan.   3. Anemia of chronic kidney disease:  - epo with treatment.   4. Secondary Hyperparathyroidism:  - cinacalcet and sevelamer.    LOS: [name], Troy Gallagher 7/10/201711:17 AM

## 2015-08-08 NOTE — Progress Notes (Signed)
HD tx ended 

## 2015-08-08 NOTE — Evaluation (Signed)
Physical Therapy Evaluation Patient Details Name: Troy Gallagher MRN: [id number] DOB: September 20, 1945 Today's Date: 08/08/2015   History of Present Illness  Patient is a 70 y/o male that presents with weakness and confusion. Was noted to have missed dialysis x 1 week due to weakness/confusion and inability to exit bed. Patient has had a CVA in the past leading to RLE weakness, leaning to R in sitting.   Clinical Impression  Patient presents with increasing generalized weakness. He is noted to have history of CVA impacting RLE strength as well as trunkal imbalance in sitting (leans to his R), which appear to be chronic. He has been having increasing difficulty with OOB mobility and was unable to attend dialysis sessions last week for the same. He is able to perform supine to sit with HHA surprisingly well, however demonstrates poor balance with R sided lean in sitting. Unable to stand safely with +2 assist at this time due to LE weakness, unclear though unlikely he could complete stand pivot transfer currently. Given the above, he would benefit from STR to increase his independence with mobility and allow him to complete dialysis treatments.     Follow Up Recommendations SNF    Equipment Recommendations   (May require lift equipment if he elects to return home)    Recommendations for Other Services       Precautions / Restrictions Precautions Precautions: Fall Restrictions Weight Bearing Restrictions: No      Mobility  Bed Mobility Overal bed mobility: +2 for physical assistance;Needs Assistance Bed Mobility: Supine to Sit;Sit to Supine     Supine to sit: Min assist Sit to supine: Mod assist;+2 for physical assistance;Max assist   General bed mobility comments: Patient uses bed rails to complete supine to sit transfer, requires assistance to bring himself to EOB. In sit to supine, gross trunkal and LE weakness noted in difficulty bringing LEs over the EOB.   Transfers Overall transfer  level: Needs assistance Equipment used: Rolling walker (2 wheeled) Transfers: Sit to/from Stand Sit to Stand: Max assist;+2 physical assistance         General transfer comment: +2 assist required to elevate hips off bed surface. He began to have buckling/flexing of LEs bilaterally, unable to maintain erect posture.   Ambulation/Gait             General Gait Details: deferred   Stairs            Wheelchair Mobility    Modified Rankin (Stroke Patients Only)       Balance Overall balance assessment: Needs assistance Sitting-balance support: Bilateral upper extremity supported Sitting balance-Leahy Scale: Poor Sitting balance - Comments: Chronically leans to his R per patient report, attempted to correct with 2 pillows underneath RUE, however continued to lean/push towards.  Postural control: Right lateral lean Standing balance support: Bilateral upper extremity supported Standing balance-Leahy Scale: Zero                               Pertinent Vitals/Pain      Home Living Family/patient expects to be discharged to:: Private residence Living Arrangements: Children Available Help at Discharge: Family Type of Home: House Home Access:  (Per old note, level entry)     Home Layout: One level Home Equipment: Wheelchair - [employer] - standard Additional Comments: patient limited historian; will verify with daughter when available    Prior Function Level of Independence: Needs assistance   Gait / Transfers  Assistance Needed: In the past he was able to ambulate short distance with std walker, he is able to use w/c per his report, unclear if true.      Comments: W/c is where he spends his OOB time, reports he can use RW to transfer, unclear how recently this was.      Hand Dominance        Extremity/Trunk Assessment   Upper Extremity Assessment: Generalized weakness           Lower Extremity Assessment: Generalized weakness  (Significant weakness in LEs, unable to complete MMTs.)         Communication   Communication: Other (comment) (Patient with slurred speech, appears chronic from prior CVA. )  Cognition Arousal/Alertness: Awake/alert Behavior During Therapy: WFL for tasks assessed/performed Overall Cognitive Status: Impaired/Different from baseline Area of Impairment:  (He is oriented to self, did not test further. He was difficult to comprehend, unclear what level his mentation is at relative to baseline. )                    General Comments      Exercises        Assessment/Plan    PT Assessment Patient needs continued PT services  PT Diagnosis Difficulty walking;Generalized weakness   PT Problem List Decreased strength;Decreased knowledge of use of DME;Decreased safety awareness;Decreased activity tolerance;Decreased balance;Decreased mobility  PT Treatment Interventions DME instruction;Gait training;Stair training;Functional mobility training;Therapeutic activities;Therapeutic exercise;Balance training   PT Goals (Current goals can be found in the Care Plan section) Acute Rehab PT Goals Patient Stated Goal: To increase his strength.  PT Goal Formulation: With patient Time For Goal Achievement: 08/22/15 Potential to Achieve Goals: Fair    Frequency Min 2X/week   Barriers to discharge Decreased caregiver support Patient does not have sufficient equipment/assistance to complete OOB mobility for dialysis.     Co-evaluation               End of Session Equipment Utilized During Treatment: Gait belt Activity Tolerance: Patient tolerated treatment well;Patient limited by fatigue Patient left: in bed;with call bell/phone within reach;with bed alarm set Nurse Communication: Mobility status         Time: [id number] PT Time Calculation (min) (ACUTE ONLY): 22 min   Charges:   PT Evaluation $PT Eval Moderate Complexity: 1 Procedure     PT G Codes:       Kerman Passey, PT, DPT    08/08/2015, 5:47 PM

## 2015-08-08 NOTE — Progress Notes (Signed)
PRE HD   

## 2015-08-08 NOTE — Progress Notes (Signed)
Patient ID: Troy Gallagher, male   DOB: [birth year], 70 y.o.   MRN: [id number] [facility] PROGRESS NOTE  MARKES BAUGHN [id number] DOB: Apr 10, 1945 DOA: 08/05/2015 PCP: Lelon Huh, MD  HPI/Subjective: Patient seen earlier while on dialysis. He complained of a headache.  Objective: Filed Vitals:   08/08/15 1247 08/08/15 1316  BP: 192/68 192/61  Pulse: 60 63  Temp: 98.3 F (36.8 C) 98.1 F (36.7 C)  Resp: 17 18    Filed Weights   08/06/15 1400 08/08/15 0939 08/08/15 1242  Weight: 95.5 kg (210 lb 8.6 oz) 92 kg (202 lb 13.2 oz) 90 kg (198 lb 6.6 oz)    ROS: Review of Systems  Constitutional: Negative for fever and chills.  Eyes: Negative for blurred vision.  Respiratory: Negative for cough and shortness of breath.   Cardiovascular: Negative for chest pain.  Gastrointestinal: Negative for nausea, vomiting, abdominal pain, diarrhea and constipation.  Genitourinary: Negative for dysuria.  Musculoskeletal: Negative for joint pain.  Neurological: Positive for headaches. Negative for dizziness.    Exam: Physical Exam  Constitutional: He is oriented to person, place, and time.  HENT:  Nose: No mucosal edema.  Mouth/Throat: No oropharyngeal exudate or posterior oropharyngeal edema.  Eyes: Conjunctivae, EOM and lids are normal. Pupils are equal, round, and reactive to light.  Neck: No JVD present. Carotid bruit is not present. No edema present. No thyroid mass and no thyromegaly present.  Cardiovascular: S1 normal and S2 normal.  Exam reveals no gallop.   Murmur heard.  Systolic murmur is present with a grade of 2/6  Pulses:      Dorsalis pedis pulses are 2+ on the right side, and 2+ on the left side.  Murmur heard while on dialysis. Probably related to flow.  Respiratory: No respiratory distress. He has no wheezes. He has no rhonchi. He has no rales.  GI: Soft. Bowel sounds are normal. There is no tenderness.  Musculoskeletal:       Right ankle: He exhibits swelling.   Left ankle: He exhibits swelling.  Lymphadenopathy:    He has no cervical adenopathy.  Neurological: He is alert and oriented to person, place, and time. No cranial nerve deficit.  Patient power 4 out of 5 right lower extremity. 5 out of 5 upper and left lower extremity.  Skin: Skin is warm. No rash noted. Nails show no clubbing.  Psychiatric: He has a normal mood and affect.      Data Reviewed: Basic Metabolic Panel:  Recent Labs Lab 08/03/15 1040 08/05/15 1423 08/06/15 0402 08/06/15 1222 08/08/15 0451  NA 141 141 141  --  139  K 3.7 3.9 3.5  --  3.4*  CL 108 110 111  --  105  CO2 20* 18* 18*  --  23  GLUCOSE 94 111* 94  --  89  BUN 58* 70* 75*  --  61*  CREATININE 8.31* 9.62* 9.72*  --  8.85*  CALCIUM 9.9 9.3 9.3  --  9.5  MG 1.8  --   --   --   --   PHOS  --   --   --  7.1*  --    Liver Function Tests:  Recent Labs Lab 08/03/15 1040 08/05/15 1423  AST 11* 12*  ALT 10* 10*  ALKPHOS 66 64  BILITOT 0.9 0.5  PROT 6.7 6.7  ALBUMIN 3.6 3.5    Recent Labs Lab 08/03/15 1048  AMMONIA 28   CBC:  Recent Labs Lab 08/03/15 1040  08/05/15 1423 08/06/15 0402 08/08/15 0451  WBC 7.7 7.1 6.6 6.1  NEUTROABS 5.8 5.2  --   --   HGB 10.3* 10.2* 9.8* 10.1*  HCT 30.2* 30.0* 29.1* 30.3*  MCV 96.0 95.2 97.0 95.4  PLT 270 270 242 274   Cardiac Enzymes:  Recent Labs Lab 08/03/15 1040 08/03/15 1344 08/08/15 0451  CKTOTAL  --   --  80  TROPONINI 0.06* 0.06*  --    CBG:  Recent Labs Lab 08/05/15 1722  GLUCAP 123*    Recent Results (from the past 240 hour(s))  MRSA PCR Screening     Status: None   Collection Time: 08/06/15  6:10 AM  Result Value Ref Range Status   MRSA by PCR NEGATIVE NEGATIVE Final    Comment:        The GeneXpert MRSA Assay (FDA approved for NASAL specimens only), is one component of a comprehensive MRSA colonization surveillance program. It is not intended to diagnose MRSA infection nor to guide or monitor treatment for MRSA  infections.       Scheduled Meds: . allopurinol  100 mg Oral QPM  . amLODipine  10 mg Oral Daily  . atorvastatin  40 mg Oral QPM  . cinacalcet  30 mg Oral Q supper  . cycloSPORINE  1 drop Both Eyes BID  . epoetin (EPOGEN/PROCRIT) injection  10,000 Units Intravenous Q M,W,F-HD  . feeding supplement (NEPRO CARB STEADY)  237 mL Oral Q24H  . ferrous sulfate  325 mg Oral BID WC  . FLUoxetine  20 mg Oral Daily  . furosemide  80 mg Oral Daily  . heparin  5,000 Units Subcutaneous Q8H  . labetalol  100 mg Oral BID  . losartan  25 mg Oral QPM  . pantoprazole  40 mg Oral BID  . rOPINIRole  2 mg Oral QHS  . sevelamer carbonate  1,600 mg Oral TID WC  . sodium chloride flush  3 mL Intravenous Q12H    Assessment/Plan:  1. Uremia, acute encephalopathy, weakness, increased respiratory rate. Patient improved after dialysis. As per nephrology, needs to sit up with dialysis prior to disposition 2. Elevated troponin. Likely false positive with renal failure. 3. Accelerated hypertension continue Norvasc labetalol and losartan 4. Gout on low-dose allopurinol 5. GERD on Protonix 6. Hyperlipidemia unspecified on atorvastatin 7. Weakness. Still awaiting physical therapy evaluation. 8. History of stroke in the past with baseline right leg weakness.  Code Status:     Code Status Orders        Start     Ordered   08/05/15 1548  Full code   Continuous     08/05/15 1548    Code Status History    Date Active Date Inactive Code Status Order ID Comments User Context   11/10/2014 12:25 AM 11/12/2014  9:08 PM Full Code [id number]  Lytle Butte, MD ED   10/04/2014 11:43 PM 10/07/2014  8:28 PM Full Code [id number]  Lance Coon, MD Inpatient   09/30/2014  4:54 AM 10/03/2014  6:24 PM Full Code [id number]  Lance Coon, MD Inpatient     Family Communication: Spoke with daughter Helene Kelp On the phone Disposition Plan: Likely out to rehabilitation on Wednesday or Thursday  Consultants:  nephrology  Time  spent: 24 minutes  Loletha Grayer  [employer]

## 2015-08-08 NOTE — Progress Notes (Signed)
HS started 

## 2015-08-08 NOTE — Progress Notes (Signed)
POSt HD assessment

## 2015-08-08 NOTE — Progress Notes (Signed)
PRE HD assessment 

## 2015-08-08 NOTE — Care Management Note (Signed)
Patient is active at [facility] on MWF schedule.  Patient had missed his last 2 treatments prior to hospital admission.  I have sent admission records to the clinic and will send additional records at discharge.  Iran Sizer  Dialysis Coordinator  [phone]

## 2015-08-08 NOTE — Care Management (Signed)
PT consult pending.  Patient off the floor during attempted assessment.

## 2015-08-09 ENCOUNTER — Inpatient Hospital Stay: Payer: Medicare Other

## 2015-08-09 ENCOUNTER — Ambulatory Visit: Payer: Medicare Other | Admitting: Family Medicine

## 2015-08-09 MED ORDER — FLEET ENEMA 7-19 GM/118ML RE ENEM
1.0000 | ENEMA | Freq: Once | RECTAL | Status: AC
  Administered 2015-08-09: 1 via RECTAL

## 2015-08-09 MED ORDER — LACTULOSE 10 GM/15ML PO SOLN
30.0000 g | Freq: Every day | ORAL | Status: DC
  Administered 2015-08-09: 30 g via ORAL
  Filled 2015-08-09: qty 60

## 2015-08-09 MED ORDER — DOCUSATE SODIUM 100 MG PO CAPS
100.0000 mg | ORAL_CAPSULE | Freq: Two times a day (BID) | ORAL | Status: DC
  Administered 2015-08-09 – 2015-08-11 (×4): 100 mg via ORAL
  Filled 2015-08-09 (×4): qty 1

## 2015-08-09 MED ORDER — ONDANSETRON HCL 4 MG/2ML IJ SOLN
4.0000 mg | Freq: Four times a day (QID) | INTRAMUSCULAR | Status: DC
  Administered 2015-08-09 – 2015-08-10 (×5): 4 mg via INTRAVENOUS
  Filled 2015-08-09 (×5): qty 2

## 2015-08-09 NOTE — Progress Notes (Signed)
Patient had another occurrence of emesis in addition to having several bowel movements today. As reported by nurse aid the emesis contained undigested food portions. Pt resting in bed. Continue to assess.

## 2015-08-09 NOTE — Progress Notes (Signed)
Patient up to chair with 3+ assist. Patient placed in recliner at 1830, per order to remain in bed for 4 hours (until 2230). Report given to next shift to continue to keep patient in recliner for 4 hours.

## 2015-08-09 NOTE — Progress Notes (Signed)
PT Cancellation Note  Patient Details Name: Troy Gallagher MRN: [id number] DOB: [birth year]   Cancelled Treatment:    Reason Eval/Treat Not Completed: Patient declined, no reason specified   Pt offered and encouraged to participate this pm.  Pt in bed declining session.  Pt stated "I have a problem"  When asked what was wrong/what I could do to help pt stated "I don't want to talk about it".  Offered to attempt getting out of bed to chair for lunch but he continued to decline.     Chesley Noon 08/09/2015, 1:29 PM

## 2015-08-09 NOTE — Progress Notes (Signed)
Patient ID: Troy Gallagher, male   DOB: 07/07/1945, 70 y.o.   MRN: [id number] [facility] PROGRESS NOTE  Troy Gallagher [id number] DOB: 1945/09/18 DOA: 08/05/2015 PCP: Lelon Huh, MD  HPI/Subjective: Patient states that he has vomiting in the evenings. Not feeling well. Very weak. As per daughter still having hallucinations.  Objective: Filed Vitals:   08/09/15 0457 08/09/15 1156  BP: 158/57 149/46  Pulse: 65 59  Temp: 98.1 F (36.7 C) 98.5 F (36.9 C)  Resp:  16    Filed Weights   08/06/15 1400 08/08/15 0939 08/08/15 1242  Weight: 95.5 kg (210 lb 8.6 oz) 92 kg (202 lb 13.2 oz) 90 kg (198 lb 6.6 oz)    ROS: Review of Systems  Constitutional: Negative for fever and chills.  Eyes: Negative for blurred vision.  Respiratory: Negative for cough and shortness of breath.   Cardiovascular: Negative for chest pain.  Gastrointestinal: Positive for nausea, vomiting and constipation. Negative for abdominal pain and diarrhea.  Genitourinary: Negative for dysuria.  Musculoskeletal: Negative for joint pain.  Neurological: Positive for headaches. Negative for dizziness.    Exam: Physical Exam  Constitutional: He is oriented to person, place, and time.  HENT:  Nose: No mucosal edema.  Mouth/Throat: No oropharyngeal exudate or posterior oropharyngeal edema.  Eyes: Conjunctivae, EOM and lids are normal. Pupils are equal, round, and reactive to light.  Neck: No JVD present. Carotid bruit is not present. No edema present. No thyroid mass and no thyromegaly present.  Cardiovascular: S1 normal and S2 normal.  Exam reveals no gallop.   Murmur heard.  Systolic murmur is present with a grade of 2/6  Pulses:      Dorsalis pedis pulses are 2+ on the right side, and 2+ on the left side.  Respiratory: No respiratory distress. He has no wheezes. He has no rhonchi. He has no rales.  GI: Soft. Bowel sounds are normal. There is no tenderness.  Musculoskeletal:       Right ankle: He exhibits  swelling.       Left ankle: He exhibits swelling.  Lymphadenopathy:    He has no cervical adenopathy.  Neurological: He is alert and oriented to person, place, and time. No cranial nerve deficit.  Patient power 4 out of 5 right lower extremity. 5 out of 5 upper and left lower extremity.  Skin: Skin is warm. No rash noted. Nails show no clubbing.  Psychiatric: He has a normal mood and affect.      Data Reviewed: Basic Metabolic Panel:  Recent Labs Lab 08/03/15 1040 08/05/15 1423 08/06/15 0402 08/06/15 1222 08/08/15 0451  NA 141 141 141  --  139  K 3.7 3.9 3.5  --  3.4*  CL 108 110 111  --  105  CO2 20* 18* 18*  --  23  GLUCOSE 94 111* 94  --  89  BUN 58* 70* 75*  --  61*  CREATININE 8.31* 9.62* 9.72*  --  8.85*  CALCIUM 9.9 9.3 9.3  --  9.5  MG 1.8  --   --   --   --   PHOS  --   --   --  7.1*  --    Liver Function Tests:  Recent Labs Lab 08/03/15 1040 08/05/15 1423  AST 11* 12*  ALT 10* 10*  ALKPHOS 66 64  BILITOT 0.9 0.5  PROT 6.7 6.7  ALBUMIN 3.6 3.5    Recent Labs Lab 08/03/15 1048  AMMONIA 28  CBC:  Recent Labs Lab 08/03/15 1040 08/05/15 1423 08/06/15 0402 08/08/15 0451  WBC 7.7 7.1 6.6 6.1  NEUTROABS 5.8 5.2  --   --   HGB 10.3* 10.2* 9.8* 10.1*  HCT 30.2* 30.0* 29.1* 30.3*  MCV 96.0 95.2 97.0 95.4  PLT 270 270 242 274   Cardiac Enzymes:  Recent Labs Lab 08/03/15 1040 08/03/15 1344 08/08/15 0451  CKTOTAL  --   --  80  TROPONINI 0.06* 0.06*  --    CBG:  Recent Labs Lab 08/05/15 1722  GLUCAP 123*    Recent Results (from the past 240 hour(s))  MRSA PCR Screening     Status: None   Collection Time: 08/06/15  6:10 AM  Result Value Ref Range Status   MRSA by PCR NEGATIVE NEGATIVE Final    Comment:        The GeneXpert MRSA Assay (FDA approved for NASAL specimens only), is one component of a comprehensive MRSA colonization surveillance program. It is not intended to diagnose MRSA infection nor to guide or monitor  treatment for MRSA infections.       Scheduled Meds: . allopurinol  100 mg Oral QPM  . amLODipine  10 mg Oral Daily  . atorvastatin  40 mg Oral QPM  . cinacalcet  30 mg Oral Q supper  . cycloSPORINE  1 drop Both Eyes BID  . docusate sodium  100 mg Oral BID  . epoetin (EPOGEN/PROCRIT) injection  10,000 Units Intravenous Q M,W,F-HD  . feeding supplement (NEPRO CARB STEADY)  237 mL Oral Q24H  . ferrous sulfate  325 mg Oral BID WC  . FLUoxetine  20 mg Oral Daily  . furosemide  80 mg Oral Daily  . heparin  5,000 Units Subcutaneous Q8H  . labetalol  100 mg Oral BID  . lactulose  30 g Oral Daily  . losartan  25 mg Oral QPM  . ondansetron (ZOFRAN) IV  4 mg Intravenous Q6H  . pantoprazole  40 mg Oral BID  . rOPINIRole  2 mg Oral QHS  . sevelamer carbonate  1,600 mg Oral TID WC  . sodium chloride flush  3 mL Intravenous Q12H    Assessment/Plan:  1. Uremia, acute encephalopathy, weakness, increased respiratory rate. Patient improving after dialysis. As per nephrology, needs to sit up with dialysis prior to disposition 2. Nausea vomiting. Constipation. Fleet enema given today abdominal flat and upright negative lactulose ordered. Standing dose Zofran ordered. 3. Hallucinations as per the daughter. I will order a CT scan of the head. 4. Elevated troponin. Likely false positive with renal failure. 5. Accelerated hypertension continue Norvasc labetalol and losartan. Blood pressure trending a little bit better. 6. Gout on low-dose allopurinol 7. GERD on Protonix 8. Hyperlipidemia unspecified on atorvastatin 9. Weakness. Physical therapy recommended rehabilitation 10. History of stroke in the past with baseline right leg weakness.  Code Status:     Code Status Orders        Start     Ordered   08/05/15 1548  Full code   Continuous     08/05/15 1548    Code Status History    Date Active Date Inactive Code Status Order ID Comments User Context   11/10/2014 12:25 AM 11/12/2014   9:08 PM Full Code [id number]  Lytle Butte, MD ED   10/04/2014 11:43 PM 10/07/2014  8:28 PM Full Code [id number]  Lance Coon, MD Inpatient   09/30/2014  4:54 AM 10/03/2014  6:24 PM Full Code [id number]  Shanon Brow  Jannifer Franklin, MD Inpatient     Family Communication: Spoke with daughter Lelon Frohlich on the phone Disposition Plan: Likely out to rehabilitation, must tolerate dialysis sitting up. Would like to see the patient not have nausea vomiting.  Consultants:  nephrology  Time spent: 25 minutes  Loletha Grayer  [employer]

## 2015-08-09 NOTE — Progress Notes (Signed)
[facility]  ROUNDING NOTE   Subjective:   Hemodialysis yesterday. Had headache during treatment.  Nausea/vomiting/constipation today and yesterday.  PT recommending SNF placement.   Objective:  Vital signs in last 24 hours:  Temp:  [98.1 F (36.7 C)-98.4 F (36.9 C)] 98.1 F (36.7 C) (07/11 0457) Pulse Rate:  [52-65] 65 (07/11 0457) Resp:  [14-30] 18 (07/10 1316) BP: (151-194)/(57-80) 158/57 mmHg (07/11 0457) SpO2:  [96 %-97 %] 97 % (07/11 0457) Weight:  [90 kg (198 lb 6.6 oz)] 90 kg (198 lb 6.6 oz) (07/10 1242)  Weight change:  Filed Weights   08/06/15 1400 08/08/15 0939 08/08/15 1242  Weight: 95.5 kg (210 lb 8.6 oz) 92 kg (202 lb 13.2 oz) 90 kg (198 lb 6.6 oz)    Intake/Output: I/O last 3 completed shifts: In: 30 [P.O.:30] Out: 0    Intake/Output this shift:     Physical Exam: General: NAD, laying in bed  Head: Normocephalic, atraumatic. Moist oral mucosal membranes  Eyes: Anicteric, PERRL  Neck: Supple, trachea midline  Lungs:  Clear to auscultation  Heart: Regular rate and rhythm  Abdomen:  Soft, nontender,   Extremities:  + peripheral edema.  Neurologic: Nonfocal, moving all four extremities  Skin: No lesions  Access: Left arm AVF    Basic Metabolic Panel:  Recent Labs Lab 08/03/15 1040 08/05/15 1423 08/06/15 0402 08/06/15 1222 08/08/15 0451  NA 141 141 141  --  139  K 3.7 3.9 3.5  --  3.4*  CL 108 110 111  --  105  CO2 20* 18* 18*  --  23  GLUCOSE 94 111* 94  --  89  BUN 58* 70* 75*  --  61*  CREATININE 8.31* 9.62* 9.72*  --  8.85*  CALCIUM 9.9 9.3 9.3  --  9.5  MG 1.8  --   --   --   --   PHOS  --   --   --  7.1*  --     Liver Function Tests:  Recent Labs Lab 08/03/15 1040 08/05/15 1423  AST 11* 12*  ALT 10* 10*  ALKPHOS 66 64  BILITOT 0.9 0.5  PROT 6.7 6.7  ALBUMIN 3.6 3.5   No results for input(s): LIPASE, AMYLASE in the last 168 hours.  Recent Labs Lab 08/03/15 1048  AMMONIA 28    CBC:  Recent  Labs Lab 08/03/15 1040 08/05/15 1423 08/06/15 0402 08/08/15 0451  WBC 7.7 7.1 6.6 6.1  NEUTROABS 5.8 5.2  --   --   HGB 10.3* 10.2* 9.8* 10.1*  HCT 30.2* 30.0* 29.1* 30.3*  MCV 96.0 95.2 97.0 95.4  PLT 270 270 242 274    Cardiac Enzymes:  Recent Labs Lab 08/03/15 1040 08/03/15 1344 08/08/15 0451  CKTOTAL  --   --  80  TROPONINI 0.06* 0.06*  --     BNP: Invalid input(s): POCBNP  CBG:  Recent Labs Lab 08/05/15 1722  GLUCAP 123*    Microbiology: Results for orders placed or performed during the hospital encounter of 08/05/15  MRSA PCR Screening     Status: None   Collection Time: 08/06/15  6:10 AM  Result Value Ref Range Status   MRSA by PCR NEGATIVE NEGATIVE Final    Comment:        The GeneXpert MRSA Assay (FDA approved for NASAL specimens only), is one component of a comprehensive MRSA colonization surveillance program. It is not intended to diagnose MRSA infection nor to guide or monitor treatment  for MRSA infections.     Coagulation Studies: No results for input(s): LABPROT, INR in the last 72 hours.  Urinalysis: No results for input(s): COLORURINE, LABSPEC, PHURINE, GLUCOSEU, HGBUR, BILIRUBINUR, KETONESUR, PROTEINUR, UROBILINOGEN, NITRITE, LEUKOCYTESUR in the last 72 hours.  Invalid input(s): APPERANCEUR    Imaging: No results found.   Medications:     . allopurinol  100 mg Oral QPM  . amLODipine  10 mg Oral Daily  . atorvastatin  40 mg Oral QPM  . cinacalcet  30 mg Oral Q supper  . cycloSPORINE  1 drop Both Eyes BID  . epoetin (EPOGEN/PROCRIT) injection  10,000 Units Intravenous Q M,W,F-HD  . feeding supplement (NEPRO CARB STEADY)  237 mL Oral Q24H  . ferrous sulfate  325 mg Oral BID WC  . FLUoxetine  20 mg Oral Daily  . furosemide  80 mg Oral Daily  . heparin  5,000 Units Subcutaneous Q8H  . labetalol  100 mg Oral BID  . losartan  25 mg Oral QPM  . ondansetron (ZOFRAN) IV  4 mg Intravenous Q6H  . pantoprazole  40 mg Oral BID   . rOPINIRole  2 mg Oral QHS  . sevelamer carbonate  1,600 mg Oral TID WC  . sodium chloride flush  3 mL Intravenous Q12H  . sodium phosphate  1 enema Rectal Once   acetaminophen **OR** acetaminophen, azelastine, hydrALAZINE, ipratropium-albuterol, lidocaine-prilocaine, loratadine, oxyCODONE, polyvinyl alcohol  Assessment/ Plan:  [name] is a 70 y.o. white male with a history of long-standing hypertension, gout, CVA, diabetes mellitus type II, BPH, prostate problems, Barrett's esophagus/peptic ulcer disease  [facility] MWF [facility]  1. End Stage Renal Disease: MWF schedule. Plan on dialysis in chair on next treatment.   2. Hypertension: elevated  - amlodipine, furosemide, labetalol and losartan.   3. Anemia of chronic kidney disease:  - epo with treatment.   4. Secondary Hyperparathyroidism:  - cinacalcet and sevelamer.    LOS: Troy Gallagher, Troy Gallagher 7/11/201710:56 AM

## 2015-08-10 LAB — CBC
HCT: 31 % — ABNORMAL LOW (ref 40.0–52.0)
Hemoglobin: 10.5 g/dL — ABNORMAL LOW (ref 13.0–18.0)
MCH: 31.9 pg (ref 26.0–34.0)
MCHC: 33.7 g/dL (ref 32.0–36.0)
MCV: 94.6 fL (ref 80.0–100.0)
PLATELETS: 276 10*3/uL (ref 150–440)
RBC: 3.28 MIL/uL — AB (ref 4.40–5.90)
RDW: 14.4 % (ref 11.5–14.5)
WBC: 6.8 10*3/uL (ref 3.8–10.6)

## 2015-08-10 LAB — RENAL FUNCTION PANEL
ALBUMIN: 3.5 g/dL (ref 3.5–5.0)
Anion gap: 12 (ref 5–15)
BUN: 43 mg/dL — ABNORMAL HIGH (ref 6–20)
CALCIUM: 9.6 mg/dL (ref 8.9–10.3)
CO2: 27 mmol/L (ref 22–32)
CREATININE: 7.48 mg/dL — AB (ref 0.61–1.24)
Chloride: 98 mmol/L — ABNORMAL LOW (ref 101–111)
GFR, EST AFRICAN AMERICAN: 7 mL/min — AB (ref >60)
GFR, EST NON AFRICAN AMERICAN: 6 mL/min — AB (ref >60)
Glucose, Bld: 109 mg/dL — ABNORMAL HIGH (ref 65–99)
Phosphorus: 6.1 mg/dL — ABNORMAL HIGH (ref 2.5–4.6)
Potassium: 3.6 mmol/L (ref 3.5–5.1)
SODIUM: 137 mmol/L (ref 135–145)

## 2015-08-10 MED ORDER — METOCLOPRAMIDE HCL 5 MG/ML IJ SOLN
10.0000 mg | Freq: Three times a day (TID) | INTRAMUSCULAR | Status: DC
  Administered 2015-08-10 (×2): 10 mg via INTRAVENOUS
  Filled 2015-08-10 (×2): qty 2

## 2015-08-10 NOTE — Clinical Social Work Note (Signed)
Patient's family has chosen [facility].  Shela Leff MSW,LCSW [phone]

## 2015-08-10 NOTE — Progress Notes (Signed)
Tolerated dialyzing in chair for 3hours.

## 2015-08-10 NOTE — Care Management Important Message (Signed)
Important Message  Patient Details  Name: Troy Gallagher MRN: [id number] Date of Birth: [birth year]   Medicare Important Message Given:  Yes    [name] 08/10/2015, 2:33 PM

## 2015-08-10 NOTE — Progress Notes (Signed)
Pre-hd tx 

## 2015-08-10 NOTE — Progress Notes (Signed)
[facility]  ROUNDING NOTE   Subjective:   Seen and examined on hemodialysis. Tolerating treatment well. UF goal 1.5kg.   Seating in chair. States continues nausea continues.     HEMODIALYSIS FLOWSHEET:  Blood Flow Rate (mL/min): 400 mL/min Arterial Pressure (mmHg): -150 mmHg Venous Pressure (mmHg): 190 mmHg Transmembrane Pressure (mmHg): 50 mmHg Ultrafiltration Rate (mL/min): 670 mL/min Dialysate Flow Rate (mL/min): 600 ml/min Conductivity: Machine : 14.1 Conductivity: Machine : 14.1 Dialysis Fluid Bolus: Normal Saline Bolus Amount (mL): 250 mL Dialysate Change: Other (comment) Intra-Hemodialysis Comments: 449. pt alert, no c/o, vss.    Objective:  Vital signs in last 24 hours:  Temp:  [98.2 F (36.8 C)-98.8 F (37.1 C)] 98.2 F (36.8 C) (07/12 0800) Pulse Rate:  [56-62] 59 (07/12 0830) Resp:  [16-20] 18 (07/12 0830) BP: (137-166)/(46-65) 137/65 mmHg (07/12 0830) SpO2:  [92 %-96 %] 94 % (07/12 0830) Weight:  [90 kg (198 lb 6.6 oz)] 90 kg (198 lb 6.6 oz) (07/12 0800)  Weight change: -2 kg (-4 lb 6.6 oz) Filed Weights   08/08/15 1242 08/10/15 0438 08/10/15 0800  Weight: 90 kg (198 lb 6.6 oz) 90 kg (198 lb 6.6 oz) 90 kg (198 lb 6.6 oz)    Intake/Output: I/O last 3 completed shifts: In: 170 [P.O.:170] Out: -    Intake/Output this shift:     Physical Exam: General: NAD, seated in chair  Head: Normocephalic, atraumatic. Moist oral mucosal membranes  Eyes: Anicteric, PERRL  Neck: Supple, trachea midline  Lungs:  Clear to auscultation  Heart: Regular rate and rhythm  Abdomen:  Soft, nontender,   Extremities:  + peripheral edema.  Neurologic: Nonfocal, moving all four extremities  Skin: No lesions  Access: Left arm AVF    Basic Metabolic Panel:  Recent Labs Lab 08/03/15 1040 08/05/15 1423 08/06/15 0402 08/06/15 1222 08/08/15 0451  NA 141 141 141  --  139  K 3.7 3.9 3.5  --  3.4*  CL 108 110 111  --  105  CO2 20* 18* 18*  --  23   GLUCOSE 94 111* 94  --  89  BUN 58* 70* 75*  --  61*  CREATININE 8.31* 9.62* 9.72*  --  8.85*  CALCIUM 9.9 9.3 9.3  --  9.5  MG 1.8  --   --   --   --   PHOS  --   --   --  7.1*  --     Liver Function Tests:  Recent Labs Lab 08/03/15 1040 08/05/15 1423  AST 11* 12*  ALT 10* 10*  ALKPHOS 66 64  BILITOT 0.9 0.5  PROT 6.7 6.7  ALBUMIN 3.6 3.5   No results for input(s): LIPASE, AMYLASE in the last 168 hours.  Recent Labs Lab 08/03/15 1048  AMMONIA 28    CBC:  Recent Labs Lab 08/03/15 1040 08/05/15 1423 08/06/15 0402 08/08/15 0451  WBC 7.7 7.1 6.6 6.1  NEUTROABS 5.8 5.2  --   --   HGB 10.3* 10.2* 9.8* 10.1*  HCT 30.2* 30.0* 29.1* 30.3*  MCV 96.0 95.2 97.0 95.4  PLT 270 270 242 274    Cardiac Enzymes:  Recent Labs Lab 08/03/15 1040 08/03/15 1344 08/08/15 0451  CKTOTAL  --   --  80  TROPONINI 0.06* 0.06*  --     BNP: Invalid input(s): POCBNP  CBG:  Recent Labs Lab 08/05/15 1722  GLUCAP 123*    Microbiology: Results for orders placed or performed during the hospital encounter  of 08/05/15  MRSA PCR Screening     Status: None   Collection Time: 08/06/15  6:10 AM  Result Value Ref Range Status   MRSA by PCR NEGATIVE NEGATIVE Final    Comment:        The GeneXpert MRSA Assay (FDA approved for NASAL specimens only), is one component of a comprehensive MRSA colonization surveillance program. It is not intended to diagnose MRSA infection nor to guide or monitor treatment for MRSA infections.     Coagulation Studies: No results for input(s): LABPROT, INR in the last 72 hours.  Urinalysis: No results for input(s): COLORURINE, LABSPEC, PHURINE, GLUCOSEU, HGBUR, BILIRUBINUR, KETONESUR, PROTEINUR, UROBILINOGEN, NITRITE, LEUKOCYTESUR in the last 72 hours.  Invalid input(s): APPERANCEUR    Imaging: Ct Head Wo Contrast  08/09/2015  CLINICAL DATA:  Nausea and vomiting EXAM: CT HEAD WITHOUT CONTRAST TECHNIQUE: Contiguous axial images were  obtained from the base of the skull through the vertex without intravenous contrast. COMPARISON:  08/03/2015 FINDINGS: Bony calvarium is intact. Atrophic and chronic white matter ischemic changes are again noted. No findings to suggest acute hemorrhage, acute infarction or space-occupying mass lesion are seen. IMPRESSION: Chronic atrophic and ischemic changes without significant interval change. Electronically Signed   By: Inez Catalina M.D.   On: 08/09/2015 18:03   Dg Abd 2 Views  08/09/2015  CLINICAL DATA:  70 year old with end-stage renal disease who missed approximately 1 week of dialysis and was admitted on 08/03/2015, presenting with acute onset of nausea and vomiting which began earlier today. EXAM: ABDOMEN - 2 VIEW COMPARISON:  06/06/2015. FINDINGS: Bowel gas pattern unremarkable without evidence of obstruction or significant ileus. No evidence of free air or significant air-fluid levels on the erect image. Moderate stool burden in the colon. No visible opaque urinary tract calculi. Aortoiliofemoral and visceral artery atherosclerosis without evidence of aneurysm. Degenerative changes throughout the lower thoracic and lumbar spine with slight thoracolumbar dextroscoliosis. Mild degenerative changes involving both hips. IMPRESSION: 1. No acute abdominal abnormality. 2. Aortoiliofemoral and visceral artery atherosclerosis without evidence of aneurysm. Electronically Signed   By: Evangeline Dakin M.D.   On: 08/09/2015 11:02     Medications:     . allopurinol  100 mg Oral QPM  . amLODipine  10 mg Oral Daily  . atorvastatin  40 mg Oral QPM  . cinacalcet  30 mg Oral Q supper  . cycloSPORINE  1 drop Both Eyes BID  . docusate sodium  100 mg Oral BID  . epoetin (EPOGEN/PROCRIT) injection  10,000 Units Intravenous Q M,W,F-HD  . feeding supplement (NEPRO CARB STEADY)  237 mL Oral Q24H  . ferrous sulfate  325 mg Oral BID WC  . FLUoxetine  20 mg Oral Daily  . furosemide  80 mg Oral Daily  . heparin   5,000 Units Subcutaneous Q8H  . labetalol  100 mg Oral BID  . lactulose  30 g Oral Daily  . losartan  25 mg Oral QPM  . ondansetron (ZOFRAN) IV  4 mg Intravenous Q6H  . pantoprazole  40 mg Oral BID  . rOPINIRole  2 mg Oral QHS  . sevelamer carbonate  1,600 mg Oral TID WC  . sodium chloride flush  3 mL Intravenous Q12H   acetaminophen **OR** acetaminophen, azelastine, hydrALAZINE, ipratropium-albuterol, lidocaine-prilocaine, loratadine, polyvinyl alcohol  Assessment/ Plan:  [name] is a 70 y.o. white male with a history of long-standing hypertension, gout, CVA, diabetes mellitus type II, BPH, prostate problems, Barrett's esophagus/peptic ulcer disease  [facility]  MWF [facility]  1. End Stage Renal Disease: MWF schedule. Seen and examined on hemodialysis. Tolerating treatment well.   2. Hypertension: at goal.  - amlodipine, furosemide, labetalol and losartan.   3. Anemia of chronic kidney disease:  - epo with treatment.   4. Secondary Hyperparathyroidism:  - cinacalcet and sevelamer.    LOS: [place], [place] 7/12/20179:02 AM

## 2015-08-10 NOTE — Progress Notes (Signed)
POST HD ASSESSMENT 

## 2015-08-10 NOTE — Progress Notes (Signed)
Patient ID: Troy Gallagher, male   DOB: 1945-04-19, 70 y.o.   MRN: [id number] [facility] PROGRESS NOTE  Troy Gallagher [id number] DOB: 12/04/1945 DOA: 08/05/2015 PCP: Lelon Huh, MD  HPI/Subjective: Patient still complains of nausea, vomiting at nighttime.  Few bowel movements 08/09/2015. No hallucinations reported by nursing staff Objective: Filed Vitals:   08/10/15 1120 08/10/15 1209  BP: 157/65 146/52  Pulse: 59 58  Temp: 98.6 F (37 C) 98.5 F (36.9 C)  Resp: 14 18    Filed Weights   08/08/15 1242 08/10/15 0438 08/10/15 0800  Weight: 90 kg (198 lb 6.6 oz) 90 kg (198 lb 6.6 oz) 90 kg (198 lb 6.6 oz)    ROS: Review of Systems  Constitutional: Negative for fever and chills.  Eyes: Negative for blurred vision.  Respiratory: Negative for cough and shortness of breath.   Cardiovascular: Negative for chest pain.  Gastrointestinal: Positive for nausea, vomiting and constipation. Negative for abdominal pain and diarrhea.  Genitourinary: Negative for dysuria.  Musculoskeletal: Negative for joint pain.  Neurological: Positive for headaches. Negative for dizziness.    Exam: Physical Exam  Constitutional: He is oriented to person, place, and time.  HENT:  Nose: No mucosal edema.  Mouth/Throat: No oropharyngeal exudate or posterior oropharyngeal edema.  Eyes: Conjunctivae, EOM and lids are normal. Pupils are equal, round, and reactive to light.  Neck: No JVD present. Carotid bruit is not present. No edema present. No thyroid mass and no thyromegaly present.  Cardiovascular: S1 normal and S2 normal.  Exam reveals no gallop.   Murmur heard.  Systolic murmur is present with a grade of 2/6  Pulses:      Dorsalis pedis pulses are 2+ on the right side, and 2+ on the left side.  Respiratory: No respiratory distress. He has no wheezes. He has no rhonchi. He has no rales.  GI: Soft. Bowel sounds are normal. There is no tenderness.  Musculoskeletal:       Right ankle: He exhibits  swelling.       Left ankle: He exhibits swelling.  Lymphadenopathy:    He has no cervical adenopathy.  Neurological: He is alert and oriented to person, place, and time. No cranial nerve deficit.  Patient power 4 out of 5 right lower extremity. 5 out of 5 upper and left lower extremity.  Skin: Skin is warm. No rash noted. Nails show no clubbing.  Psychiatric: He has a normal mood and affect.      Data Reviewed: Basic Metabolic Panel:  Recent Labs Lab 08/05/15 1423 08/06/15 0402 08/06/15 1222 08/08/15 0451 08/10/15 0904  NA 141 141  --  139 137  K 3.9 3.5  --  3.4* 3.6  CL 110 111  --  105 98*  CO2 18* 18*  --  23 27  GLUCOSE 111* 94  --  89 109*  BUN 70* 75*  --  61* 43*  CREATININE 9.62* 9.72*  --  8.85* 7.48*  CALCIUM 9.3 9.3  --  9.5 9.6  PHOS  --   --  7.1*  --  6.1*   Liver Function Tests:  Recent Labs Lab 08/05/15 1423 08/10/15 0904  AST 12*  --   ALT 10*  --   ALKPHOS 64  --   BILITOT 0.5  --   PROT 6.7  --   ALBUMIN 3.5 3.5   No results for input(s): AMMONIA in the last 168 hours. CBC:  Recent Labs Lab 08/05/15 1423 08/06/15 0402 08/08/15  0451 08/10/15 0904  WBC 7.1 6.6 6.1 6.8  NEUTROABS 5.2  --   --   --   HGB 10.2* 9.8* 10.1* 10.5*  HCT 30.0* 29.1* 30.3* 31.0*  MCV 95.2 97.0 95.4 94.6  PLT 270 242 274 276   Cardiac Enzymes:  Recent Labs Lab 08/03/15 1344 08/08/15 0451  CKTOTAL  --  80  TROPONINI 0.06*  --    CBG:  Recent Labs Lab 08/05/15 1722  GLUCAP 123*    Recent Results (from the past 240 hour(s))  MRSA PCR Screening     Status: None   Collection Time: 08/06/15  6:10 AM  Result Value Ref Range Status   MRSA by PCR NEGATIVE NEGATIVE Final    Comment:        The GeneXpert MRSA Assay (FDA approved for NASAL specimens only), is one component of a comprehensive MRSA colonization surveillance program. It is not intended to diagnose MRSA infection nor to guide or monitor treatment for MRSA infections.        Scheduled Meds: . allopurinol  100 mg Oral QPM  . amLODipine  10 mg Oral Daily  . atorvastatin  40 mg Oral QPM  . cinacalcet  30 mg Oral Q supper  . cycloSPORINE  1 drop Both Eyes BID  . docusate sodium  100 mg Oral BID  . epoetin (EPOGEN/PROCRIT) injection  10,000 Units Intravenous Q M,W,F-HD  . feeding supplement (NEPRO CARB STEADY)  237 mL Oral Q24H  . ferrous sulfate  325 mg Oral BID WC  . FLUoxetine  20 mg Oral Daily  . furosemide  80 mg Oral Daily  . heparin  5,000 Units Subcutaneous Q8H  . labetalol  100 mg Oral BID  . lactulose  30 g Oral Daily  . losartan  25 mg Oral QPM  . metoCLOPramide (REGLAN) injection  10 mg Intravenous Q8H  . ondansetron (ZOFRAN) IV  4 mg Intravenous Q6H  . pantoprazole  40 mg Oral BID  . rOPINIRole  2 mg Oral QHS  . sevelamer carbonate  1,600 mg Oral TID WC  . sodium chloride flush  3 mL Intravenous Q12H    Assessment/Plan:  1. Uremia, acute encephalopathy, weakness, increased respiratory rate. Patient Is overall improving with dialysis. As per nephrology, needs to sit up with dialysis prior to disposition, patient was able to sit for 4 hours in the recliner last night. Physical therapist evaluated patient and recommended rehabilitation placement.  2. Nausea vomiting. Constipation. Fleet enema given yesterday, patient had 2 bowel movements, x-ray abdominal flat and upright was negative, discontinue lactulose. Standing dose Zofran ordered, adding Reglan 3 times a day due to concerns of gastroparesis related to diabetes. 3. Hallucinations as per the daughter, not reported by staff. CT scan of the head was unremarkable, but atrophy. 4. Elevated troponin. Likely false positive with renal failure. 5. Accelerated hypertension continue Norvasc labetalol and losartan. Blood pressure has been improving with dialysis. 6. Gout on low-dose allopurinol 7. GERD on Protonix 8. Hyperlipidemia unspecified on atorvastatin 9. Weakness. Physical therapy  recommended rehabilitation 10. History of stroke in the past with baseline right leg weakness, continue physical therapy.  Code Status:     Code Status Orders        Start     Ordered   08/05/15 1548  Full code   Continuous     08/05/15 1548    Code Status History    Date Active Date Inactive Code Status Order ID Comments User Context  11/10/2014 12:25 AM 11/12/2014  9:08 PM Full Code [id number]  Lytle Butte, MD ED   10/04/2014 11:43 PM 10/07/2014  8:28 PM Full Code [id number]  Lance Coon, MD Inpatient   09/30/2014  4:54 AM 10/03/2014  6:24 PM Full Code [id number]  Lance Coon, MD Inpatient     Family Communication:  Disposition Plan: Likely out to rehabilitation, must tolerate dialysis sitting up. Would like to see the patient not have nausea vomiting before discharge.  Consultants:  nephrology  Time spent: 43 minutes  [employer]

## 2015-08-10 NOTE — Clinical Social Work Note (Signed)
CSW received call from patient's daughter, Amy because she had been given my number by DSS caseworker as the Insurance account manager. CSW explained that she needed to contact Vance Gather with DSS medicaid and gave her the contact information. While Amy was on the phone, CSW extended bed offers of [facility], [facility], [place]. Amy will be talking with her sister and will informing CSW of their decision. Shela Leff MSW,LCSW  [phone]

## 2015-08-10 NOTE — Progress Notes (Signed)
Hemodialysis started.patient dialyzing up in reclining chair.

## 2015-08-10 NOTE — Progress Notes (Signed)
END OF HD TX 

## 2015-08-10 NOTE — Progress Notes (Signed)
This note also relates to the following rows which could not be included: Pulse Rate - Cannot attach notes to unvalidated device data Resp - Cannot attach notes to unvalidated device data BP - Cannot attach notes to unvalidated device data SpO2 - Cannot attach notes to unvalidated device data   POST HD TX

## 2015-08-10 NOTE — Progress Notes (Signed)
08/10/2015 7:47 AM  Night shift RN reported that pt spent more than 4 hours up in the recliner in his room last night.  Marciano Sequin, day shift RN, got him into the chair at 18:30 and Gae Bon, night shift RN reported that he was returned to bed shortly after 2300.  Dola Argyle, RN

## 2015-08-11 DIAGNOSIS — R531 Weakness: Secondary | ICD-10-CM

## 2015-08-11 DIAGNOSIS — N2581 Secondary hyperparathyroidism of renal origin: Secondary | ICD-10-CM

## 2015-08-11 DIAGNOSIS — R112 Nausea with vomiting, unspecified: Secondary | ICD-10-CM

## 2015-08-11 DIAGNOSIS — Z8673 Personal history of transient ischemic attack (TIA), and cerebral infarction without residual deficits: Secondary | ICD-10-CM

## 2015-08-11 DIAGNOSIS — K5900 Constipation, unspecified: Secondary | ICD-10-CM

## 2015-08-11 DIAGNOSIS — M109 Gout, unspecified: Secondary | ICD-10-CM

## 2015-08-11 DIAGNOSIS — R778 Other specified abnormalities of plasma proteins: Secondary | ICD-10-CM

## 2015-08-11 DIAGNOSIS — R443 Hallucinations, unspecified: Secondary | ICD-10-CM

## 2015-08-11 DIAGNOSIS — R7989 Other specified abnormal findings of blood chemistry: Secondary | ICD-10-CM

## 2015-08-11 DIAGNOSIS — G9341 Metabolic encephalopathy: Secondary | ICD-10-CM

## 2015-08-11 DIAGNOSIS — E785 Hyperlipidemia, unspecified: Secondary | ICD-10-CM

## 2015-08-11 DIAGNOSIS — F0390 Unspecified dementia without behavioral disturbance: Secondary | ICD-10-CM

## 2015-08-11 DIAGNOSIS — D638 Anemia in other chronic diseases classified elsewhere: Secondary | ICD-10-CM

## 2015-08-11 DIAGNOSIS — I10 Essential (primary) hypertension: Secondary | ICD-10-CM

## 2015-08-11 MED ORDER — DOCUSATE SODIUM 100 MG PO CAPS: 100.0000 mg | ORAL_CAPSULE | Freq: Two times a day (BID) | ORAL | Status: AC

## 2015-08-11 MED ORDER — ALPRAZOLAM 0.5 MG PO TABS: 0.5000 mg | ORAL_TABLET | Freq: Every day | ORAL | Status: AC

## 2015-08-11 MED ORDER — NEPRO/CARBSTEADY PO LIQD: 237.0000 mL | ORAL | Status: AC

## 2015-08-11 NOTE — Progress Notes (Signed)
Physical Therapy Treatment Patient Details Name: Troy Gallagher MRN: [id number] DOB: [birth year] Today's Date: 08/11/2015    History of Present Illness Patient is a 70 y/o male that presents with weakness and confusion. Was noted to have missed dialysis x 1 week due to weakness/confusion and inability to exit bed. Patient has had a CVA in the past leading to RLE weakness, leaning to R in sitting.     PT Comments    Pt agreeable to PT; denies pain or other issues. Pt participates well with supine bed exercises requiring re instruction several times with several exercises to continue with correct technique. Pt continues to be a heavy bed mobility transfer and heavy transfer out of bed requiring 2+ assist. Pt demonstrates heavy lean to the right with sitting and poor endurance as well as inability to support weight to stand. At the time of this document, pt has current discharge orders to skilled nursing facility today for continued progression of strength, range and endurance to improve all functional mobility.   Follow Up Recommendations  SNF     Equipment Recommendations       Recommendations for Other Services       Precautions / Restrictions Restrictions Weight Bearing Restrictions: No    Mobility  Bed Mobility Overal bed mobility: Needs Assistance Bed Mobility: Supine to Sit     Supine to sit: Max assist;Mod assist;HOB elevated     General bed mobility comments: Pt able to scoot hips toward edge of bed with cues and encouragement as well as increased time. Heavy assist for elevating trunk. Heavy lean to the R; able to maintain upright balance for approximately 5 minutes before fatigue and falling to the R again.   Transfers Overall transfer level: Needs assistance Equipment used: None (attempted with rw; unable ) Transfers: Sit to/from [facility] Sit to Stand: Max assist;+2 physical assistance;From elevated surface Stand pivot transfers: Max assist;+2  physical assistance;From elevated surface       General transfer comment: Attempted 2x; initial attempt with rw; pt lifts RLE in stand. Second attempt without AD; Pt gives no assist in stand; RLE flexed at knee with little to no real weightbearing  Ambulation/Gait             General Gait Details: Unable; pt feels he has not ambulated in a month   Stairs            Wheelchair Mobility    Modified Rankin (Stroke Patients Only)       Balance Overall balance assessment: Needs assistance Sitting-balance support: Bilateral upper extremity supported;Feet supported Sitting balance-Leahy Scale: Fair Sitting balance - Comments: Pt falls to right with attempted weight shift R/L and with min lift LLE for scooting forward. Unable to perform seated march.  Postural control: Right lateral lean Standing balance support: Bilateral upper extremity supported Standing balance-Leahy Scale: Zero                      Cognition Arousal/Alertness: Awake/alert Behavior During Therapy: WFL for tasks assessed/performed Overall Cognitive Status: No family/caregiver present to determine baseline cognitive functioning Area of Impairment: Following commands       Following Commands: Follows one step commands inconsistently       General Comments: Requires some re instructions during exercises, as well as with postural control in sitting    Exercises General Exercises - Lower Extremity Ankle Circles/Pumps: AROM;Both;20 reps;Supine Quad Sets: Strengthening;Both;15 reps;Supine;Other (comment) (requires re instruction several times) Gluteal Sets: Strengthening;Both;15 reps;Supine (  requires re instruction several times) Short Arc Quad: AROM;Both;20 reps;Supine;Other (comment) (poor range/full extension on R) Heel Slides: AROM;Both;20 reps;Supine Hip ABduction/ADduction: AAROM;Both;20 reps;Supine Straight Leg Raises: AAROM;Both;10 reps;Supine    General Comments         Pertinent Vitals/Pain Pain Assessment: No/denies pain    Home Living                      Prior Function            PT Goals (current goals can now be found in the care plan section) Progress towards PT goals: Progressing toward goals    Frequency  Min 2X/week    PT Plan Current plan remains appropriate    Co-evaluation             End of Session Equipment Utilized During Treatment: Gait belt Activity Tolerance: Patient limited by fatigue;Other (comment) (limited by weakness) Patient left: in chair;with call bell/phone within reach;with chair alarm set     Time: 1059-1130 PT Time Calculation (min) (ACUTE ONLY): 31 min  Charges:  $Therapeutic Exercise: 8-22 mins $Therapeutic Activity: 8-22 mins                    G Codes:      Charlaine Dalton, PTA 08/11/2015, 11:59 AM

## 2015-08-11 NOTE — Progress Notes (Signed)
08/11/2015 11:30 AM  Called report to Early Osmond, receiving nurse at [facility] where pt is being discharged.  Dola Argyle, RN

## 2015-08-11 NOTE — Progress Notes (Signed)
[facility]  ROUNDING NOTE   Subjective:   Hemodialysis treatment yesterday. Tolerated treatment well. UF of 1.5. Seated in chair entire treatment.   Objective:  Vital signs in last 24 hours:  Temp:  [98.5 F (36.9 C)-98.8 F (37.1 C)] 98.6 F (37 C) (07/13 0554) Pulse Rate:  [53-66] 66 (07/13 0554) Resp:  [14-18] 17 (07/13 0554) BP: (127-159)/(51-68) 149/51 mmHg (07/13 0554) SpO2:  [93 %-97 %] 94 % (07/13 0554)  Weight change: 0 kg (0 lb) Filed Weights   08/08/15 1242 08/10/15 0438 08/10/15 0800  Weight: 90 kg (198 lb 6.6 oz) 90 kg (198 lb 6.6 oz) 90 kg (198 lb 6.6 oz)    Intake/Output: I/O last 3 completed shifts: In: 1320 [P.O.:1320] Out: 1500 [Other:1500]   Intake/Output this shift:     Physical Exam: General: NAD, laying in bed  Head: Normocephalic, atraumatic. Moist oral mucosal membranes  Eyes: Anicteric, PERRL  Neck: Supple, trachea midline  Lungs:  Clear to auscultation  Heart: Regular rate and rhythm  Abdomen:  Soft, nontender,   Extremities:  + peripheral edema.  Neurologic: Nonfocal, moving all four extremities  Skin: No lesions  Access: Left arm AVF    Basic Metabolic Panel:  Recent Labs Lab 08/05/15 1423 08/06/15 0402 08/06/15 1222 08/08/15 0451 08/10/15 0904  NA 141 141  --  139 137  K 3.9 3.5  --  3.4* 3.6  CL 110 111  --  105 98*  CO2 18* 18*  --  23 27  GLUCOSE 111* 94  --  89 109*  BUN 70* 75*  --  61* 43*  CREATININE 9.62* 9.72*  --  8.85* 7.48*  CALCIUM 9.3 9.3  --  9.5 9.6  PHOS  --   --  7.1*  --  6.1*    Liver Function Tests:  Recent Labs Lab 08/05/15 1423 08/10/15 0904  AST 12*  --   ALT 10*  --   ALKPHOS 64  --   BILITOT 0.5  --   PROT 6.7  --   ALBUMIN 3.5 3.5   No results for input(s): LIPASE, AMYLASE in the last 168 hours. No results for input(s): AMMONIA in the last 168 hours.  CBC:  Recent Labs Lab 08/05/15 1423 08/06/15 0402 08/08/15 0451 08/10/15 0904  WBC 7.1 6.6 6.1 6.8   NEUTROABS 5.2  --   --   --   HGB 10.2* 9.8* 10.1* 10.5*  HCT 30.0* 29.1* 30.3* 31.0*  MCV 95.2 97.0 95.4 94.6  PLT 270 242 274 276    Cardiac Enzymes:  Recent Labs Lab 08/08/15 0451  CKTOTAL 80    BNP: Invalid input(s): POCBNP  CBG:  Recent Labs Lab 08/05/15 1722  GLUCAP 28*    Microbiology: Results for orders placed or performed during the hospital encounter of 08/05/15  MRSA PCR Screening     Status: None   Collection Time: 08/06/15  6:10 AM  Result Value Ref Range Status   MRSA by PCR NEGATIVE NEGATIVE Final    Comment:        The GeneXpert MRSA Assay (FDA approved for NASAL specimens only), is one component of a comprehensive MRSA colonization surveillance program. It is not intended to diagnose MRSA infection nor to guide or monitor treatment for MRSA infections.     Coagulation Studies: No results for input(s): LABPROT, INR in the last 72 hours.  Urinalysis: No results for input(s): COLORURINE, LABSPEC, PHURINE, GLUCOSEU, HGBUR, BILIRUBINUR, KETONESUR, PROTEINUR, UROBILINOGEN, NITRITE, LEUKOCYTESUR in  the last 72 hours.  Invalid input(s): APPERANCEUR    Imaging: Ct Head Wo Contrast  08/09/2015  CLINICAL DATA:  Nausea and vomiting EXAM: CT HEAD WITHOUT CONTRAST TECHNIQUE: Contiguous axial images were obtained from the base of the skull through the vertex without intravenous contrast. COMPARISON:  08/03/2015 FINDINGS: Bony calvarium is intact. Atrophic and chronic white matter ischemic changes are again noted. No findings to suggest acute hemorrhage, acute infarction or space-occupying mass lesion are seen. IMPRESSION: Chronic atrophic and ischemic changes without significant interval change. Electronically Signed   By: Inez Catalina M.D.   On: 08/09/2015 18:03     Medications:     . allopurinol  100 mg Oral QPM  . amLODipine  10 mg Oral Daily  . atorvastatin  40 mg Oral QPM  . cinacalcet  30 mg Oral Q supper  . cycloSPORINE  1 drop Both Eyes  BID  . docusate sodium  100 mg Oral BID  . epoetin (EPOGEN/PROCRIT) injection  10,000 Units Intravenous Q M,W,F-HD  . feeding supplement (NEPRO CARB STEADY)  237 mL Oral Q24H  . ferrous sulfate  325 mg Oral BID WC  . FLUoxetine  20 mg Oral Daily  . furosemide  80 mg Oral Daily  . heparin  5,000 Units Subcutaneous Q8H  . labetalol  100 mg Oral BID  . losartan  25 mg Oral QPM  . metoCLOPramide (REGLAN) injection  10 mg Intravenous Q8H  . ondansetron (ZOFRAN) IV  4 mg Intravenous Q6H  . pantoprazole  40 mg Oral BID  . rOPINIRole  2 mg Oral QHS  . sevelamer carbonate  1,600 mg Oral TID WC  . sodium chloride flush  3 mL Intravenous Q12H   acetaminophen **OR** acetaminophen, azelastine, hydrALAZINE, ipratropium-albuterol, lidocaine-prilocaine, loratadine, polyvinyl alcohol  Assessment/ Plan:  Troy Gallagher is a 70 y.o. white male with a history of long-standing hypertension, gout, CVA, diabetes mellitus type II, BPH, prostate problems, Barrett's esophagus/peptic ulcer disease  [facility] MWF [facility]  1. End Stage Renal Disease: MWF schedule.  2. Hypertension: at goal.  - amlodipine, furosemide, labetalol and losartan.   3. Anemia of chronic kidney disease:  - epo with treatment.   4. Secondary Hyperparathyroidism:  - cinacalcet and sevelamer.    LOS: [place], Rosalia Mcavoy 7/13/201710:59 AM

## 2015-08-11 NOTE — Discharge Summary (Signed)
[facility] NAME: Troy Gallagher    MR#:  [id number]  DATE OF BIRTH:  September 29, 1945  DATE OF ADMISSION:  08/05/2015 ADMITTING PHYSICIAN: Lytle Butte, MD  DATE OF DISCHARGE: No discharge date for patient encounter.  PRIMARY CARE PHYSICIAN: Lelon Huh, MD     ADMISSION DIAGNOSIS:  Acute pulmonary edema ([place]) [J81.0] Disorientation [R41.0] ESRD on hemodialysis ([place]) [N18.6, Z99.2]  DISCHARGE DIAGNOSIS:  Principal Problem:   Uremia Active Problems:   Encephalopathy, metabolic   Hallucinations   Nausea with vomiting   Constipation   Essential hypertension, malignant   Elevated troponin   Generalized weakness   Dementia   ESRD on hemodialysis (HCC)   HLD (hyperlipidemia)   Gout   Hyperlipidemia   History of stroke   Anemia of chronic disease   Secondary hyperparathyroidism ([place])   SECONDARY DIAGNOSIS:   Past Medical History  Diagnosis Date  . Complication of anesthesia   . Hypertension   . Stroke ([place])   . GERD (gastroesophageal reflux disease)   . Headache   . Kidney dialysis status [id number]  . HLD (hyperlipidemia)   . Diabetes mellitus, type II ([place])   . Seizure ([place])   . BPH (benign prostatic hyperplasia)   . COPD (chronic obstructive pulmonary disease) ([place])   . PVD (peripheral vascular disease) ([place])   . Carotid stenosis   . Adenoma of large intestine   . ESRD on hemodialysis ([place])     MWF  . Anxiety   . RLS (restless legs syndrome)     .pro HOSPITAL COURSE:   Patient is 70 year old Caucasian male with history of end-stage renal disease, hypertension, hyperlipidemia, BPH, COPD, restless leg syndrome, dementia, who presents to the hospital with confusion, increased weakness. The patient apparently missed and diarrhea. Week's worth of dialysis, stating that he felt too weak, and became progressively confused, family admitted the patient hallucinating. On arrival to the hospital. Patient's BUN was  found to be elevated at 75, phosphorus is 7.1, PTH was 196, hemoglobin level was 9.8 otherwise labs were unremarkable. Chest x-ray revealed cardiomegaly and pulmonary venous hypertension, early interstitial edema. Abdominal x-ray revealed no acute abdominal abnormality, Aortoiliofemoral and visceral artery atherosclerosis without evidence of aneurysm. CT of the head without contrast revealed chronic atrophic and ischemic changes without significant interval change. Patient was admitted to the hospital for further evaluation and treatment. He was consulted by nephrologist and underwent consecutive hemodialysis daily. His condition improved. He was seen by physical therapist and recommended rehabilitation placement. No infection was noted. Discussion by problem: 1. Uremia, acute encephalopathy, weakness, increased respiratory rate. Patient has improved with dialysis. The patient was able to sit for 4 hours in the recliner few nights before. Physical therapist evaluated patient and recommended rehabilitation placement, where patient will be sent today.  2. Nausea vomiting. Constipation. Fleet enema given few days ago, patient had 2 bowel movements 08/09/2015, x-ray abdominal flat and upright was negative, continue stool softeners as needed. Patient may benefit from Zofran, Reglan intermittently, especially if his nausea, vomiting recurs since patient may have gastroparesis related to diabetes. Patient is eating 75% of offered meals by the day of discharge. 3. Hallucinations , not reported by staff. CT scan of the head was unremarkable, but atrophy. Patient denies any hallucinations at present, suspected metabolic encephalopathy related to uremia  4. Elevated troponin. Likely false positive with renal failure, markedly elevated blood pressure due to missed hemodialysis sessions.Echocardiogram done in September 2016 showed  no abnormalities, no echocardiogram was repeated during this time, since patient's troponin  was only 0.06. Patient had no chest pains.   5. Accelerated hypertension, continue Norvasc, diuretic,  labetalol and losartan. Blood pressure has been improving with dialysis.Advanced blood pressure medications as needed.  6. Gout on low-dose allopurinol 7. GERD on Protonix 8. Hyperlipidemia unspecified,  on atorvastatin 9. Weakness. Physical therapy recommended rehabilitation, Patient will be discharged today  10. History of stroke in the past with baseline right leg weakness, continue physical therapy.  11. Dementia, supportive therapy, may benefit from Aricept, memantine, to be started as outpatient  DISCHARGE CONDITIONS:   Stable   CONSULTS OBTAINED:  Treatment Team:  Lytle Butte, MD Anthonette Legato, MD  DRUG ALLERGIES:   Allergies  Allergen Reactions  . Sulfa Antibiotics   . Sulfur Hives    DISCHARGE MEDICATIONS:   Current Discharge Medication List    START taking these medications   Details  docusate sodium (COLACE) 100 MG capsule Take 1 capsule (100 mg total) by mouth 2 (two) times daily. Qty: 10 capsule, Refills: 0    Nutritional Supplements (FEEDING SUPPLEMENT, NEPRO CARB STEADY,) LIQD Take 237 mLs by mouth daily. Qty: 30 Can, Refills: 0      CONTINUE these medications which have NOT CHANGED   Details  acetaminophen (TYLENOL) 325 MG tablet Take 650 mg by mouth at bedtime.     albuterol-ipratropium (COMBIVENT) 18-103 MCG/ACT inhaler Inhale 2 puffs into the lungs every 4 (four) hours as needed for wheezing or shortness of breath.    allopurinol (ZYLOPRIM) 100 MG tablet Take 100 mg by mouth every evening.     ALPRAZolam (XANAX) 0.5 MG tablet Take 1 tablet (0.5 mg total) by mouth at bedtime. Qty: 180 tablet, Refills: 1    amLODipine (NORVASC) 10 MG tablet Take 10 mg by mouth daily. Pt does not take on dialysis days.    atorvastatin (LIPITOR) 40 MG tablet Take 40 mg by mouth every evening.     cinacalcet (SENSIPAR) 30 MG tablet Take 30 mg by mouth every  evening.    cycloSPORINE (RESTASIS) 0.05 % ophthalmic emulsion Place 1 drop into both eyes 2 (two) times daily. Qty: 0.4 mL, Refills: 3    esomeprazole (NEXIUM) 40 MG capsule Take 1 capsule (40 mg total) by mouth 2 (two) times daily. Qty: 60 capsule, Refills: 6    FLUoxetine (PROZAC) 20 MG capsule Take 20 mg by mouth daily. Refills: 5    labetalol (NORMODYNE) 100 MG tablet Take 100 mg by mouth 2 (two) times daily.    lidocaine-prilocaine (EMLA) cream Apply 1 application topically as needed (before port access).     losartan (COZAAR) 25 MG tablet Take 25 mg by mouth every evening.    rOPINIRole (REQUIP) 2 MG tablet Take 2 mg by mouth at bedtime.    sevelamer carbonate (RENVELA) 800 MG tablet Take 2 tablets (1,600 mg total) by mouth 3 (three) times daily with meals. Qty: 90 tablet, Refills: 6    ferrous sulfate 325 (65 FE) MG tablet Take 1 tablet (325 mg total) by mouth 2 (two) times daily with a meal. Qty: 60 tablet, Refills: 3    furosemide (LASIX) 80 MG tablet Take 80 mg by mouth daily. Reported on 08/05/2015    polyvinyl alcohol (LIQUIFILM TEARS) 1.4 % ophthalmic solution Place 1 drop into both eyes as needed for dry eyes. Qty: 15 mL, Refills: 0      STOP taking these medications     azelastine (  ASTELIN) 0.1 % nasal spray      loratadine (CLARITIN) 10 MG tablet          DISCHARGE INSTRUCTIONS:    The patient is to follow-up with primary care physician, nephrologist, to continue hemodialysis Mondays, Wednesdays, Fridays schedule   If you experience worsening of your admission symptoms, develop shortness of breath, life threatening emergency, suicidal or homicidal thoughts you must seek medical attention immediately by calling 911 or calling your MD immediately  if symptoms less severe.  You Must read complete instructions/literature along with all the possible adverse reactions/side effects for all the Medicines you take and that have been prescribed to you. Take any  new Medicines after you have completely understood and accept all the possible adverse reactions/side effects.   Please note  You were cared for by a hospitalist during your hospital stay. If you have any questions about your discharge medications or the care you received while you were in the hospital after you are discharged, you can call the unit and asked to speak with the hospitalist on call if the hospitalist that took care of you is not available. Once you are discharged, your primary care physician will handle any further medical issues. Please note that NO REFILLS for any discharge medications will be authorized once you are discharged, as it is imperative that you return to your primary care physician (or establish a relationship with a primary care physician if you do not have one) for your aftercare needs so that they can reassess your need for medications and monitor your lab values.    Today   CHIEF COMPLAINT:   Chief Complaint  Patient presents with  . Weakness  . Failure To Thrive    HISTORY OF PRESENT ILLNESS:  Takuya Topf  is a 70 y.o. male with a known history of End-stage renal disease, hypertension, hyperlipidemia, BPH, COPD, restless leg syndrome, dementia, who presents to the hospital with confusion, increased weakness. The patient apparently missed and diarrhea. Week's worth of dialysis, stating that he felt too weak, and became progressively confused, family admitted the patient hallucinating. On arrival to the hospital. Patient's BUN was found to be elevated at 75, phosphorus is 7.1, PTH was 196, hemoglobin level was 9.8 otherwise labs were unremarkable. Chest x-ray revealed cardiomegaly and pulmonary venous hypertension, early interstitial edema. Abdominal x-ray revealed no acute abdominal abnormality, Aortoiliofemoral and visceral artery atherosclerosis without evidence of aneurysm. CT of the head without contrast revealed chronic atrophic and ischemic changes without  significant interval change. Patient was admitted to the hospital for further evaluation and treatment. He was consulted by nephrologist and underwent consecutive hemodialysis daily. His condition improved. He was seen by physical therapist and recommended rehabilitation placement. No infection was noted. Discussion by problem: 11. Uremia, acute encephalopathy, weakness, increased respiratory rate. Patient has improved with dialysis. The patient was able to sit for 4 hours in the recliner few nights before. Physical therapist evaluated patient and recommended rehabilitation placement, where patient will be sent today.  12. Nausea vomiting. Constipation. Fleet enema given few days ago, patient had 2 bowel movements 08/09/2015, x-ray abdominal flat and upright was negative, continue stool softeners as needed. Patient may benefit from Zofran, Reglan intermittently, especially if his nausea, vomiting recurs since patient may have gastroparesis related to diabetes. Patient is eating 75% of offered meals by the day of discharge. 13. Hallucinations , not reported by staff. CT scan of the head was unremarkable, but atrophy. Patient denies any hallucinations at present,  suspected metabolic encephalopathy related to uremia  14. Elevated troponin. Likely false positive with renal failure, markedly elevated blood pressure due to missed hemodialysis sessions.Echocardiogram done in September 2016 showed no abnormalities, no echocardiogram was repeated during this time, since patient's troponin was only 0.06. Patient had no chest pains.   15. Accelerated hypertension, continue Norvasc, diuretic,  labetalol and losartan. Blood pressure has been improving with dialysis.Advanced blood pressure medications as needed.  16. Gout on low-dose allopurinol 17. GERD on Protonix 18. Hyperlipidemia unspecified,  on atorvastatin 19. Weakness. Physical therapy recommended rehabilitation, Patient will be discharged today  20. History  of stroke in the past with baseline right leg weakness, continue physical therapy.  11. Dementia, supportive therapy, may benefit from Aricept, memantine, to be started as outpatient    VITAL SIGNS:  Blood pressure 149/51, pulse 66, temperature 98.6 F (37 C), temperature source Oral, resp. rate 17, height 5\' 9"  (1.753 m), weight 90 kg (198 lb 6.6 oz), SpO2 94 %.  I/O:   Intake/Output Summary (Last 24 hours) at 08/11/15 1037 Last data filed at 08/11/15 0554  Gross per 24 hour  Intake   1320 ml  Output   1500 ml  Net   -180 ml    PHYSICAL EXAMINATION:  GENERAL:  70 y.o.-year-old patient lying in the bed with no acute distress.  EYES: Pupils equal, round, reactive to light and accommodation. No scleral icterus. Extraocular muscles intact.  HEENT: Head atraumatic, normocephalic. Oropharynx and nasopharynx clear.  NECK:  Supple, no jugular venous distention. No thyroid enlargement, no tenderness.  LUNGS: Normal breath sounds bilaterally, no wheezing, rales,rhonchi or crepitation. No use of accessory muscles of respiration.  CARDIOVASCULAR: S1, S2 normal. No murmurs, rubs, or gallops.  ABDOMEN: Soft, non-tender, non-distended. Bowel sounds present. No organomegaly or mass.  EXTREMITIES: No pedal edema, cyanosis, or clubbing.  NEUROLOGIC: Cranial nerves II through XII are intact. Muscle strength 5/5 in all extremities. Sensation intact. Gait not checked.  PSYCHIATRIC: The patient is alert and oriented x 3.  SKIN: No obvious rash, lesion, or ulcer.   DATA REVIEW:   CBC  Recent Labs Lab 08/10/15 0904  WBC 6.8  HGB 10.5*  HCT 31.0*  PLT 276    Chemistries   Recent Labs Lab 08/05/15 1423  08/10/15 0904  NA 141  < > 137  K 3.9  < > 3.6  CL 110  < > 98*  CO2 18*  < > 27  GLUCOSE 111*  < > 109*  BUN 70*  < > 43*  CREATININE 9.62*  < > 7.48*  CALCIUM 9.3  < > 9.6  AST 12*  --   --   ALT 10*  --   --   ALKPHOS 64  --   --   BILITOT 0.5  --   --   < > = values in this  interval not displayed.  Cardiac Enzymes No results for input(s): TROPONINI in the last 168 hours.  Microbiology Results  Results for orders placed or performed during the hospital encounter of 08/05/15  MRSA PCR Screening     Status: None   Collection Time: 08/06/15  6:10 AM  Result Value Ref Range Status   MRSA by PCR NEGATIVE NEGATIVE Final    Comment:        The GeneXpert MRSA Assay (FDA approved for NASAL specimens only), is one component of a comprehensive MRSA colonization surveillance program. It is not intended to diagnose MRSA infection nor to guide or  monitor treatment for MRSA infections.     RADIOLOGY:  Ct Head Wo Contrast  08/09/2015  CLINICAL DATA:  Nausea and vomiting EXAM: CT HEAD WITHOUT CONTRAST TECHNIQUE: Contiguous axial images were obtained from the base of the skull through the vertex without intravenous contrast. COMPARISON:  08/03/2015 FINDINGS: Bony calvarium is intact. Atrophic and chronic white matter ischemic changes are again noted. No findings to suggest acute hemorrhage, acute infarction or space-occupying mass lesion are seen. IMPRESSION: Chronic atrophic and ischemic changes without significant interval change. Electronically Signed   By: Inez Catalina M.D.   On: 08/09/2015 18:03   Dg Abd 2 Views  08/09/2015  CLINICAL DATA:  70 year old with end-stage renal disease who missed approximately 1 week of dialysis and was admitted on 08/03/2015, presenting with acute onset of nausea and vomiting which began earlier today. EXAM: ABDOMEN - 2 VIEW COMPARISON:  06/06/2015. FINDINGS: Bowel gas pattern unremarkable without evidence of obstruction or significant ileus. No evidence of free air or significant air-fluid levels on the erect image. Moderate stool burden in the colon. No visible opaque urinary tract calculi. Aortoiliofemoral and visceral artery atherosclerosis without evidence of aneurysm. Degenerative changes throughout the lower thoracic and lumbar spine  with slight thoracolumbar dextroscoliosis. Mild degenerative changes involving both hips. IMPRESSION: 1. No acute abdominal abnormality. 2. Aortoiliofemoral and visceral artery atherosclerosis without evidence of aneurysm. Electronically Signed   By: Evangeline Dakin M.D.   On: 08/09/2015 11:02    EKG:   Orders placed or performed during the hospital encounter of 08/05/15  . ED EKG  . ED EKG  . EKG 12-Lead  . EKG 12-Lead      Management plans discussed with the patient, family and they are in agreement.  CODE STATUS:     Code Status Orders        Start     Ordered   08/05/15 1548  Full code   Continuous     08/05/15 1548    Code Status History    Date Active Date Inactive Code Status Order ID Comments User Context   11/10/2014 12:25 AM 11/12/2014  9:08 PM Full Code [id number]  Lytle Butte, MD ED   10/04/2014 11:43 PM 10/07/2014  8:28 PM Full Code [id number]  Lance Coon, MD Inpatient   09/30/2014  4:54 AM 10/03/2014  6:24 PM Full Code [id number]  Lance Coon, MD Inpatient      TOTAL TIME TAKING CARE OF THIS PATIENT: 40 minutes.    Theodoro Grist M.D on 08/11/2015 at 10:37 AM  Between 7am to 6pm - Pager - [phone]  After 6pm go to [email or web address] - password [email or web address] [facility]  Office  [phone]  CC: Primary care physician; Lelon Huh, MD

## 2015-10-23 IMAGING — CT CT HEAD WITHOUT CONTRAST
1 of 2 series · 15 of 30 positions shown, 19 images · non-contrast
Comparison: MR HEAD W/O CM dated 12/04/2011; CT HEAD W/O CM dated
12/03/2011

CLINICAL DATA: Generalized weakness.

EXAM:
CT HEAD WITHOUT CONTRAST
TECHNIQUE: Contiguous axial images were obtained from the base of the skull
through the vertex without intravenous contrast.

[Series 2: head wo · axial · 0.49mm/px · z∈[-116,+19]mm · 15 of 34 slices shown, 19 images]
[im 2/34  brain]
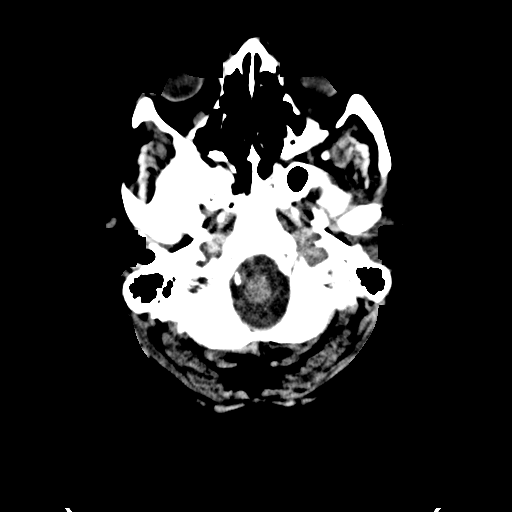
[im 2/34  bone]
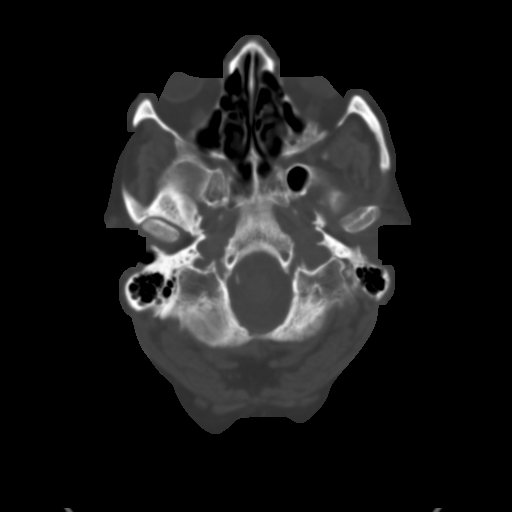
[im 4/34  brain]
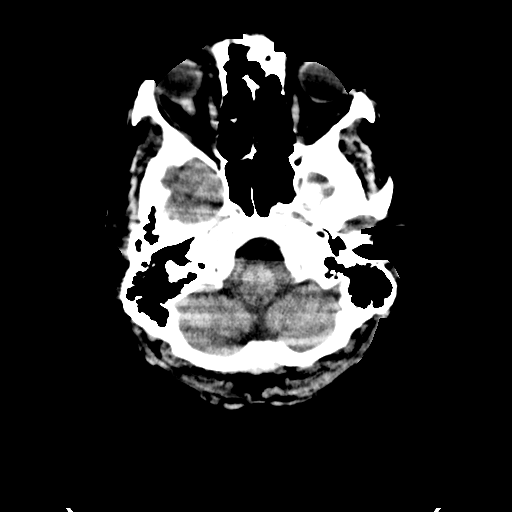
[im 7/34  brain]
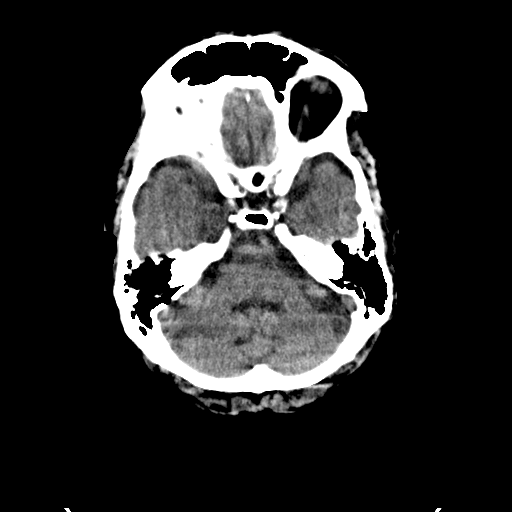
[im 9/34  brain]
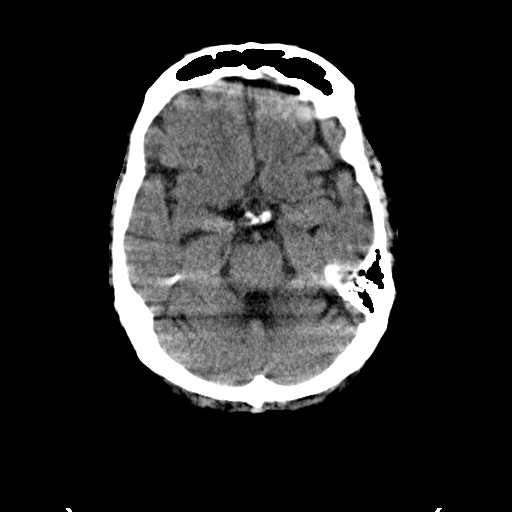
[im 10/34  brain]
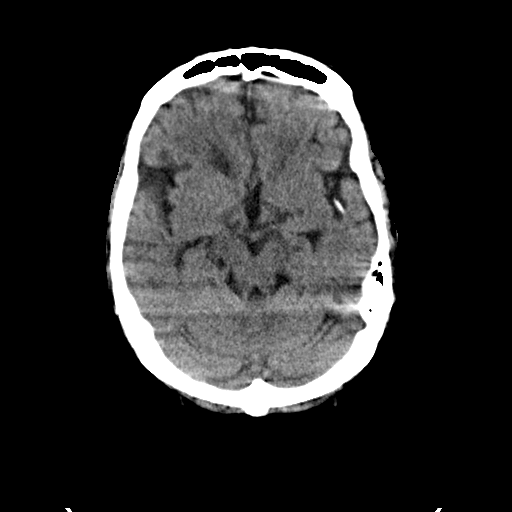
[im 10/34  bone]
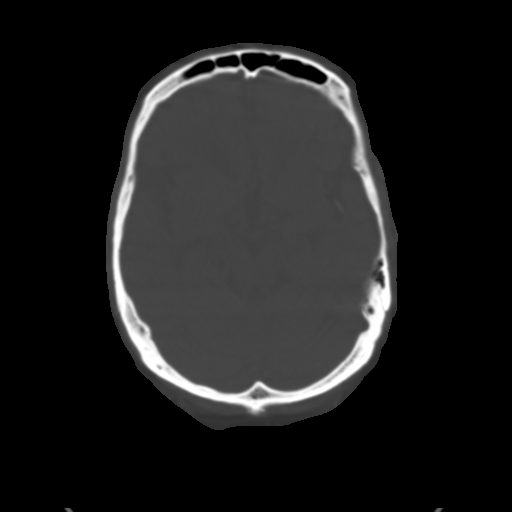
[im 12/34  brain]
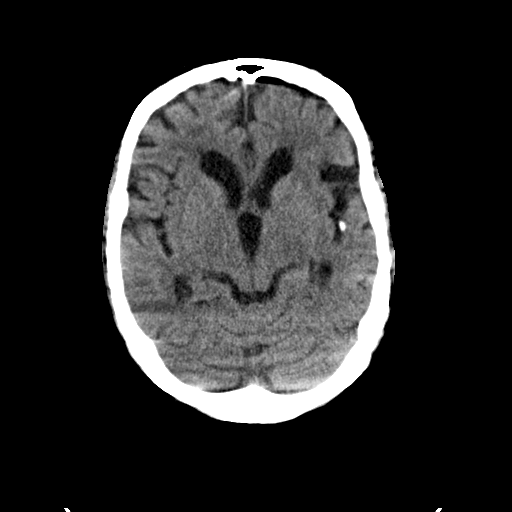
[im 15/34  brain]
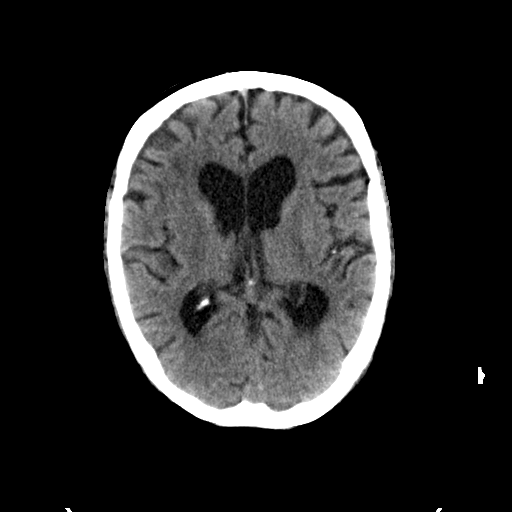
[im 17/34  brain]
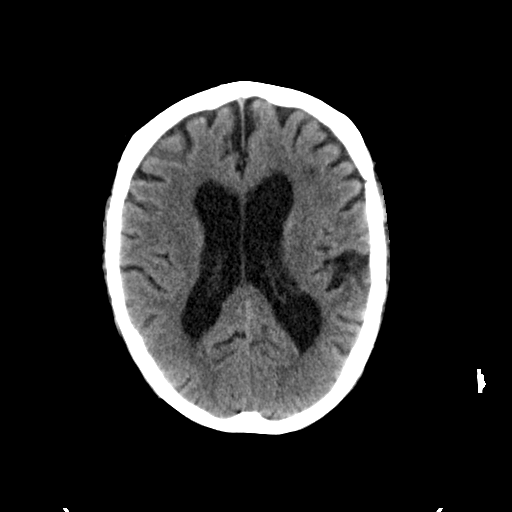
[im 19/34  brain]
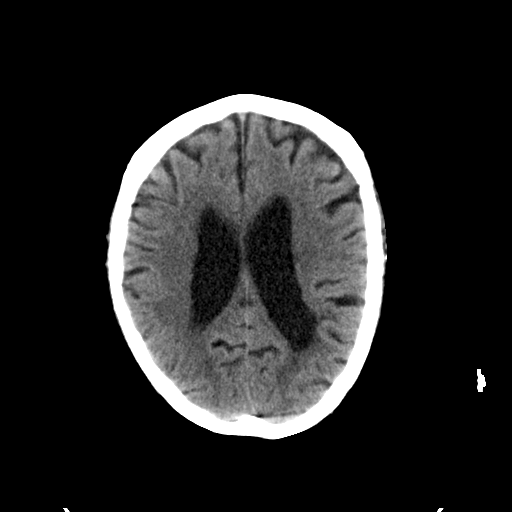
[im 19/34  bone]
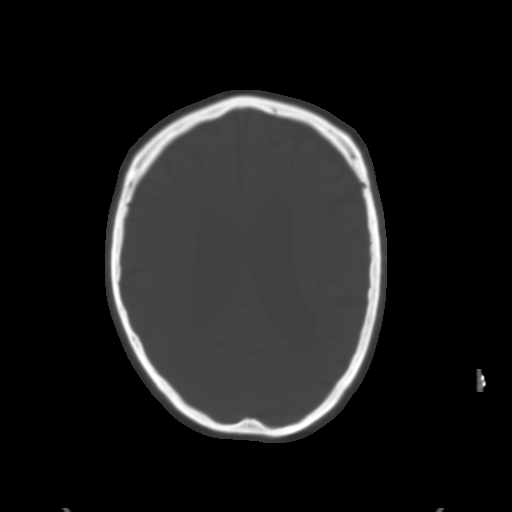
[im 22/34  brain]
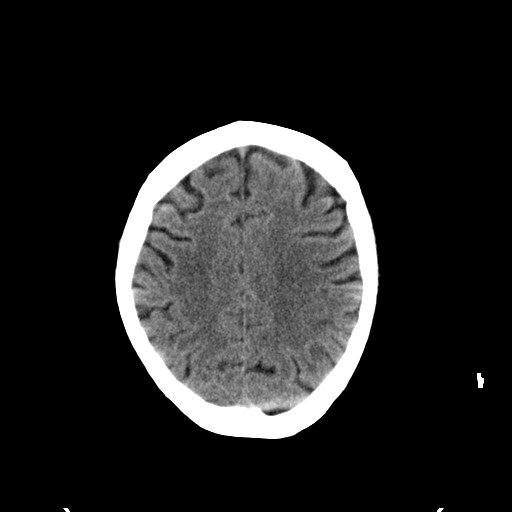
[im 24/34  brain]
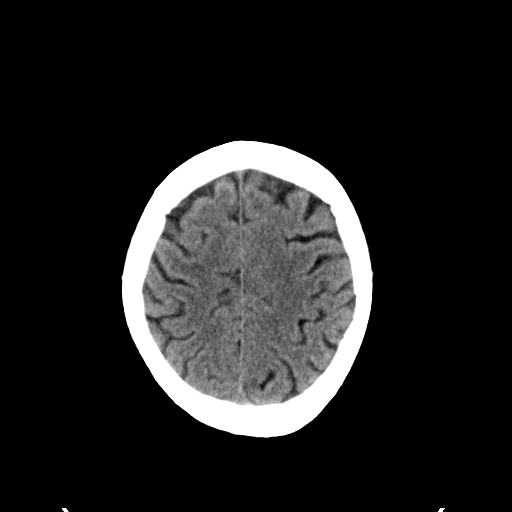
[im 25/34  brain]
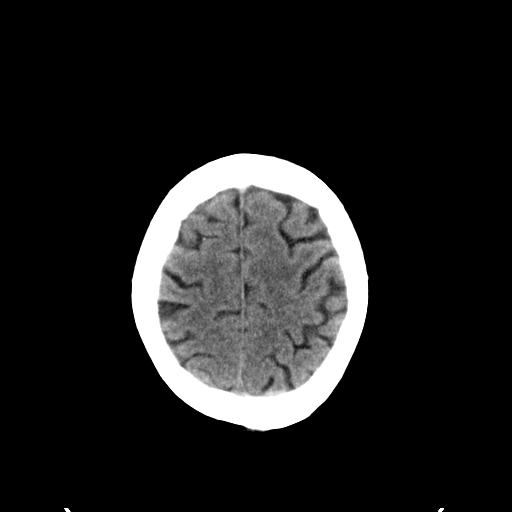
[im 27/34  brain]
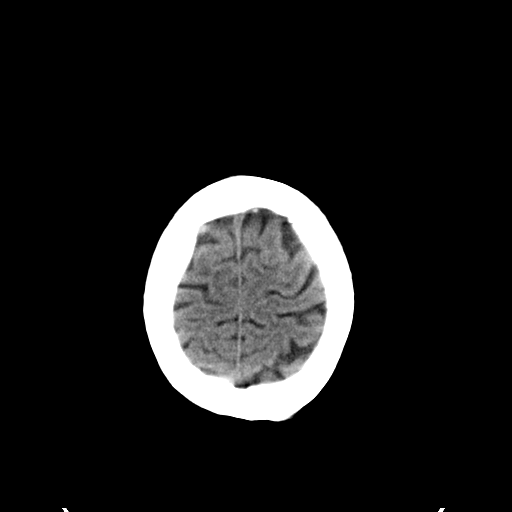
[im 27/34  bone]
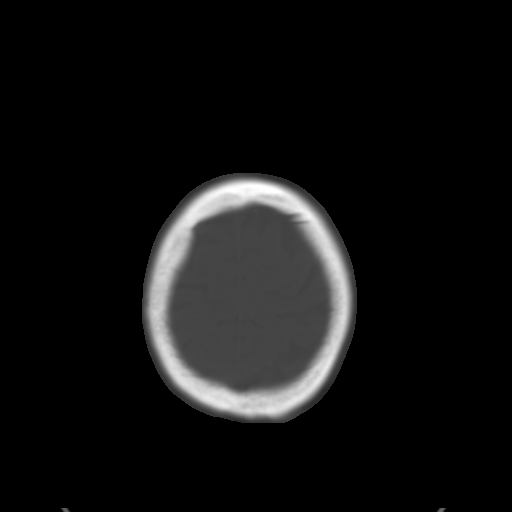
[im 30/34  brain]
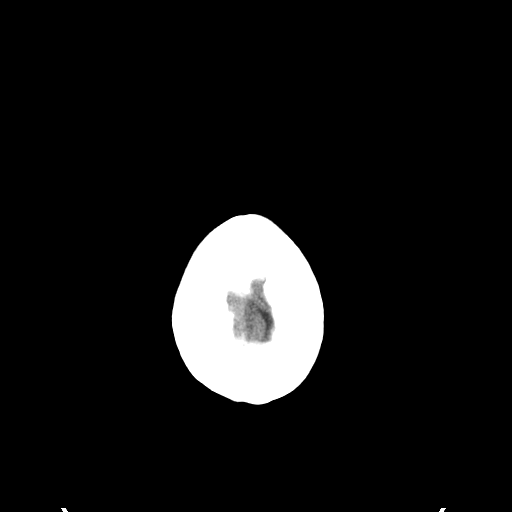
[im 32/34  brain]
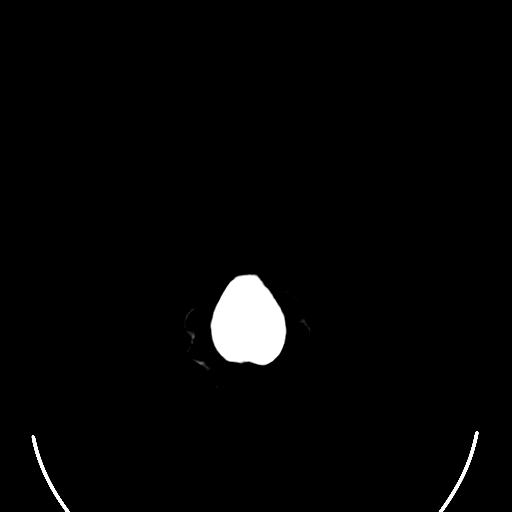

[15 of 30 positions shown; findings below may reference images not displayed]

FINDINGS: Moderate to severe ventriculomegaly, the anterior recess of the
third ventricle is 12 mm, previously 11 mm. No intraparenchymal
hemorrhage, mass effect or midline shift. Mild left posterior
temporal lobe cortical to subcortical encephalomalacia corresponding
to remote infarct. No acute large vascular territory infarct.

No abnormal extra-axial fluid collections. Calcified left middle
cerebral artery, advanced from prior CT. Moderate calcific
atherosclerosis of the carotid siphons and included vertebral
arteries.

Ocular globes and orbital contents are nonsuspicious. Mild paranasal
sinus mucosal thickening without air-fluid levels. No skull
fracture.
IMPRESSION: Moderate to severe global brain atrophy, slightly advanced from
prior examination.

Remote left posterior temporal lobe infarct, with calcified left
middle cerebral artery most consistent with chronic exclusion.

  By: Marcin Dycht Blachut

## 2015-12-12 ENCOUNTER — Emergency Department
Admission: EM | Admit: 2015-12-12 | Discharge: 2015-12-30 | Disposition: E | Payer: Medicare Other | Attending: Emergency Medicine | Admitting: Emergency Medicine

## 2015-12-12 DIAGNOSIS — I469 Cardiac arrest, cause unspecified: Secondary | ICD-10-CM | POA: Diagnosis not present

## 2015-12-12 DIAGNOSIS — I120 Hypertensive chronic kidney disease with stage 5 chronic kidney disease or end stage renal disease: Secondary | ICD-10-CM | POA: Diagnosis not present

## 2015-12-12 DIAGNOSIS — J449 Chronic obstructive pulmonary disease, unspecified: Secondary | ICD-10-CM | POA: Insufficient documentation

## 2015-12-12 DIAGNOSIS — N186 End stage renal disease: Secondary | ICD-10-CM | POA: Diagnosis not present

## 2015-12-12 DIAGNOSIS — E1122 Type 2 diabetes mellitus with diabetic chronic kidney disease: Secondary | ICD-10-CM | POA: Insufficient documentation

## 2015-12-12 DIAGNOSIS — F17200 Nicotine dependence, unspecified, uncomplicated: Secondary | ICD-10-CM | POA: Diagnosis not present

## 2015-12-12 DIAGNOSIS — Z992 Dependence on renal dialysis: Secondary | ICD-10-CM | POA: Diagnosis not present

## 2015-12-12 DIAGNOSIS — Z79899 Other long term (current) drug therapy: Secondary | ICD-10-CM | POA: Diagnosis not present

## 2015-12-12 MED ORDER — EPINEPHRINE PF 1 MG/10ML IJ SOSY
PREFILLED_SYRINGE | INTRAMUSCULAR | Status: AC | PRN
  Administered 2015-12-12: 1 mg via INTRAVENOUS

## 2015-12-12 MED ORDER — CALCIUM CHLORIDE 10 % IV SOLN
INTRAVENOUS | Status: AC | PRN
  Administered 2015-12-12: 1 g via INTRAVENOUS

## 2015-12-12 MED ORDER — SODIUM BICARBONATE 8.4 % IV SOLN
INTRAVENOUS | Status: AC | PRN
  Administered 2015-12-12: 50 meq via INTRAVENOUS

## 2015-12-12 NOTE — Code Documentation (Signed)
Code called by Dr Kerman Passey

## 2015-12-12 NOTE — ED Triage Notes (Addendum)
Pt brought to er via ems for cardiac arrest - found by staff at nursing home unresponsive and pulseless/apnea - ems arrived and started cpr - gave 11 epi, 2 atropine, 2 narcan, 1 calcium - after 8th round of epi pt regained pulse and was breathing spontaneously for approx 10 minutes then became pulseless and apnea again

## 2015-12-12 NOTE — ED Notes (Addendum)
Pt brought to er via ems for cardiac arrest - found by staff at nursing home unresponsive and pulseless/apnea - ems arrived and started cpr - gave 11 epi, 2 atropine, 2 narcan, 1 calcium - after 8th round of epi pt regained pulse and was breathing spontaneously for approx 10 minutes then became pulseless and apnea again - pt has 20g right AC and IO right tibia - pt has king airway in place and is being bagged with cpr in progress upon arrival

## 2015-12-12 NOTE — Code Documentation (Signed)
Pulse check per MD

## 2015-12-12 NOTE — Code Documentation (Addendum)
MD assess heart with ultrasound - no pulse no rhythm

## 2015-12-12 NOTE — ED Notes (Signed)
[facility] was paged to emergency response. PT was announced dead quickly after [facility] arrived. [place] worked with Patient Navigator to move family from waiting room to PT viewing. CH offered prayer and support for family.

## 2015-12-12 NOTE — Code Documentation (Signed)
Resume CPR  ?

## 2015-12-12 NOTE — ED Provider Notes (Signed)
[facility] Emergency Department Provider Note  Time seen: 9:35 PM  I have reviewed the triage vital signs and the nursing notes.   HISTORY  Chief Complaint Cardiac Arrest    HPI Troy Gallagher is a 70 y.o. male with multiple medical issues, end-stage renal disease on hemodialysis, COPD, diabetes, who presents to the emergency department as a cardiac arrest. According to EMS the patient lives at a nursing facility. Per report the nurse at the facility checked on the patient, went to get his nighttime medications and return back to the room within 10 minutes at that point patient was found to be unresponsive without a pulse and not breathing. CPR was started by nursing home staff. EMS arrived with CPR in progress, EMS continued CPR for approximately 20-30 minutes on scene, and had return of spontaneous circulation. They transported to the emergency department, however within approximately 5-10 minutes of return of spontaneous circulation, pulses were once again loss. Upon arrival to the emergency department patient has CPR in progress, asystole/extreme bradycardia PEA as the underlying rhythm.King airway in place. Right tibia IO line, right antecubital IV in place upon arrival.  Past Medical History:  Diagnosis Date  . Adenoma of large intestine   . Anxiety   . BPH (benign prostatic hyperplasia)   . Carotid stenosis   . Complication of anesthesia   . COPD (chronic obstructive pulmonary disease) ([place])   . Diabetes mellitus, type II ([place])   . ESRD on hemodialysis ([place])    MWF  . GERD (gastroesophageal reflux disease)   . Headache   . HLD (hyperlipidemia)   . Hypertension   . Kidney dialysis status [id number]  . PVD (peripheral vascular disease) ([place])   . RLS (restless legs syndrome)   . Seizure ([place])   . Stroke [facility])     Patient Active Problem List   Diagnosis Date Noted  . Encephalopathy, metabolic [id number]  . Hallucinations 08/11/2015  . Nausea with  vomiting 08/11/2015  . Constipation 08/11/2015  . Essential hypertension, malignant 08/11/2015  . Elevated troponin 08/11/2015  . Gout 08/11/2015  . Hyperlipidemia 08/11/2015  . Generalized weakness 08/11/2015  . Dementia 08/11/2015  . History of stroke 08/11/2015  . Anemia of chronic disease 08/11/2015  . Secondary hyperparathyroidism ([place]) 08/11/2015  . Uremia 08/05/2015  . History of adenomatous polyp of colon 11/30/2014  . Rotator cuff tear arthropathy of right shoulder 10/03/2014  . Weakness generalized 09/30/2014  . ESRD on hemodialysis ([place]) 09/30/2014  . GERD (gastroesophageal reflux disease) 09/30/2014  . HTN (hypertension) 09/30/2014  . HLD (hyperlipidemia) 09/30/2014  . Type 2 diabetes mellitus ([place]) 09/30/2014  . Anxiety 09/30/2014  . RLS (restless legs syndrome) 09/30/2014  . History of CVA (cerebrovascular accident) 09/28/2014  . Symptoms, musculoskeletal, limb NEC 09/28/2014  . Anemia 09/28/2014  . Hyperlipidemia, mixed 09/28/2014  . Bilateral carotid artery stenosis 12/04/2011  . Depressive disorder 01/29/1998  . Barrett esophagus 01/29/1998    Past Surgical History:  Procedure Laterality Date  . ARCH AORTOGRAM  04/07/2013   85-90% Stenosis origin of right ICA. 65-70% narrowing of left CCA. 50-60% narrowing of left subclavian artery  . AV FISTULA PLACEMENT Left 2 years  . Carotid doppler ultrasound  04/22/2013   85-90% stenosis of the RICA, 60-70% stenosis of left cmmon carotid and origin of left subclavian  . CAROTID ENDARTERECTOMY Right 06/05/2013   Dr. Delana Meyer  . COLONOSCOPY N/A 06/14/2014   Procedure: COLONOSCOPY;  Surgeon: Hulen Luster, MD;  Location: [facility] ENDOSCOPY;  Service: Gastroenterology;  Laterality: N/A;  . COLONOSCOPY WITH PROPOFOL N/A 11/11/2014   Procedure: COLONOSCOPY WITH PROPOFOL;  Surgeon: Hulen Luster, MD;  Location: [facility] ENDOSCOPY;  Service: Endoscopy;  Laterality: N/A;  . DOPPLER ECHOCARDIOGRAPHY  12/04/2011   Bilateral stenosis 80%  .  ESOPHAGOGASTRODUODENOSCOPY (EGD) WITH PROPOFOL N/A 11/11/2014   Procedure: ESOPHAGOGASTRODUODENOSCOPY (EGD) WITH PROPOFOL;  Surgeon: Hulen Luster, MD;  Location: [facility] ENDOSCOPY;  Service: Endoscopy;  Laterality: N/A;  . NECK SURGERY N/A 10/24/2009   Cervical Diskectomy- C3-4, 4-5, 5-6 anterior disckectomy by Dr. Arnoldo Morale at [place] N/A 04/26/2015   Procedure: A/V Shuntogram/Fistulagram;  Surgeon: Katha Cabal, MD;  Location: [place] CV LAB;  Service: Cardiovascular;  Laterality: N/A;  . PERIPHERAL VASCULAR CATHETERIZATION N/A 04/26/2015   Procedure: A/V Shunt Intervention;  Surgeon: Katha Cabal, MD;  Location: [place] CV LAB;  Service: Cardiovascular;  Laterality: N/A;  . SHOULDER ARTHROSCOPY W/ ROTATOR CUFF REPAIR Left 1999  . TONSILLECTOMY AND ADENOIDECTOMY  1950  . UPPER GI ENDOSCOPY  12/31/2013   Barrets esophagus, H. Pylori negative; recommned daily PPI. Repeat in 3 years    Prior to Admission medications   Medication Sig Start Date End Date Taking? Authorizing Provider  acetaminophen (TYLENOL) 325 MG tablet Take 650 mg by mouth at bedtime.     Historical Provider, MD  albuterol-ipratropium (COMBIVENT) 18-103 MCG/ACT inhaler Inhale 2 puffs into the lungs every 4 (four) hours as needed for wheezing or shortness of breath.    Historical Provider, MD  allopurinol (ZYLOPRIM) 100 MG tablet Take 100 mg by mouth every evening.     Historical Provider, MD  ALPRAZolam Duanne Moron) 0.5 MG tablet Take 1 tablet (0.5 mg total) by mouth at bedtime. 08/11/15   Theodoro Grist, MD  amLODipine (NORVASC) 10 MG tablet Take 10 mg by mouth daily. Pt does not take on dialysis days.    Historical Provider, MD  atorvastatin (LIPITOR) 40 MG tablet Take 40 mg by mouth every evening.     Historical Provider, MD  cinacalcet (SENSIPAR) 30 MG tablet Take 30 mg by mouth every evening.    Historical Provider, MD  cycloSPORINE (RESTASIS) 0.05 % ophthalmic  emulsion Place 1 drop into both eyes 2 (two) times daily. 10/03/14   Theodoro Grist, MD  docusate sodium (COLACE) 100 MG capsule Take 1 capsule (100 mg total) by mouth 2 (two) times daily. 08/11/15   Theodoro Grist, MD  esomeprazole (NEXIUM) 40 MG capsule Take 1 capsule (40 mg total) by mouth 2 (two) times daily. 09/06/14   Birdie Sons, MD  ferrous sulfate 325 (65 FE) MG tablet Take 1 tablet (325 mg total) by mouth 2 (two) times daily with a meal. Patient not taking: Reported on 08/05/2015 11/12/14   Gladstone Lighter, MD  FLUoxetine (PROZAC) 20 MG capsule Take 20 mg by mouth daily. 07/13/15   Historical Provider, MD  furosemide (LASIX) 80 MG tablet Take 80 mg by mouth daily. Reported on 08/05/2015    Historical Provider, MD  labetalol (NORMODYNE) 100 MG tablet Take 100 mg by mouth 2 (two) times daily. 07/13/15   Historical Provider, MD  lidocaine-prilocaine (EMLA) cream Apply 1 application topically as needed (before port access).     Historical Provider, MD  losartan (COZAAR) 25 MG tablet Take 25 mg by mouth every evening.    Historical Provider, MD  Nutritional Supplements (FEEDING SUPPLEMENT, NEPRO CARB STEADY,) LIQD Take 237 mLs by mouth daily. 08/11/15   Theodoro Grist,  MD  polyvinyl alcohol (LIQUIFILM TEARS) 1.4 % ophthalmic solution Place 1 drop into both eyes as needed for dry eyes. Patient not taking: Reported on 08/05/2015 10/07/14   Fritzi Mandes, MD  rOPINIRole (REQUIP) 2 MG tablet Take 2 mg by mouth at bedtime.    Historical Provider, MD  sevelamer carbonate (RENVELA) 800 MG tablet Take 2 tablets (1,600 mg total) by mouth 3 (three) times daily with meals. 10/03/14   Theodoro Grist, MD    Allergies  Allergen Reactions  . Sulfa Antibiotics   . Sulfur Hives    Family History  Problem Relation Age of Onset  . Stroke Mother   . Prostate cancer Father   . Heart disease Sister     Social History Social History  Substance Use Topics  . Smoking status: Former Smoker    Packs/day: 2.00    Years:  30.00    Types: Cigarettes    Quit date: 01/29/2002  . Smokeless tobacco: Current User  . Alcohol use No    Review of Systems Unable to obtain a review of systems as the patient is unresponsive/cardiac arrest  ____________________________________________   PHYSICAL EXAM:  Constitutional: Unresponsive, CPR in progress Eyes: 3-4 millimeters, not responsive to light ENT   Head: Normocephalic and atraumatic.   Mouth/Throat: King airway in place. Cardiovascular: Compression progress. Good pulse with compressions. Respiratory: Breath sounds are equal with ventilations, no gastric ventilations sounds heard. Gastrointestinal: No distention, no signs of trauma Musculoskeletal: No obvious trauma Neurologic: Unresponsive Skin:  Skin is cool and dry. Psychiatric: Unresponsive  ____________________________________________   INITIAL IMPRESSION / ASSESSMENT AND PLAN / ED COURSE  Pertinent labs & imaging results that were available during my care of the patient were reviewed by me and considered in my medical decision making (see chart for details).  Patient arrived via EMS emergency traffic, CPR in progress. Compressions were continued in the emergency department. Patient received several rounds of medications including calcium, sodium bicarbonate, epinephrine. Patient remained in a extreme bradycardia PEA rhythm, and asystole at times. Bedside ultrasound shows extremely slight cardiac contraction, no palpable pulse with contractions. PEA activity around 20 bpm. CPR was continued. However after several rounds of CPR patient continued to be in a extreme bradycardia PEA rhythm around 20 bpm with no palpable pulse. At this point with fixed pupils, minimal to no cardiac activity on ultrasound with no palpable pulse time of death was called at 9:08 PM. I discussed the patient with family members.  CRITICAL CARE Performed by: Harvest Dark   Total critical care time: 30  minutes  Critical care time was exclusive of separately billable procedures and treating other patients.  Critical care was necessary to treat or prevent imminent or life-threatening deterioration.  Critical care was time spent personally by me on the following activities: development of treatment plan with patient and/or surrogate as well as nursing, discussions with consultants, evaluation of patient's response to treatment, examination of patient, obtaining history from patient or surrogate, ordering and performing treatments and interventions, ordering and review of laboratory studies, ordering and review of radiographic studies, pulse oximetry and re-evaluation of patient's condition.   ____________________________________________   FINAL CLINICAL IMPRESSION(S) / ED DIAGNOSES  Cardiac arrest    Harvest Dark, MD 12/06/2015 [id number]

## 2015-12-13 MED FILL — Medication: Qty: 1 | Status: AC

## 2015-12-30 DIAGNOSIS — 419620001 Death: Secondary | SNOMED CT | POA: Insufficient documentation

## 2015-12-30 DEATH — deceased

## 2016-04-19 ENCOUNTER — Encounter (INDEPENDENT_AMBULATORY_CARE_PROVIDER_SITE_OTHER): Payer: Self-pay

## 2016-04-19 ENCOUNTER — Ambulatory Visit (INDEPENDENT_AMBULATORY_CARE_PROVIDER_SITE_OTHER): Payer: Self-pay | Admitting: Vascular Surgery

## 2016-09-07 IMAGING — CT CT HEAD WITHOUT CONTRAST
1 series · 15 of 30 positions shown, 19 images · non-contrast
Comparison: 05/25/2013; 12/04/2011

CLINICAL DATA: Lethargic. Altered mental status. History of
end-stage renal disease, on dialysis. Patient has missed 2 previous
dialysis sessions.

EXAM:
CT HEAD WITHOUT CONTRAST
TECHNIQUE: Contiguous axial images were obtained from the base of the skull
through the vertex without intravenous contrast.

[Series 4: soft tissue recon · axial · 0.41mm/px · z∈[-176,-49]mm · 15 of 35 slices shown, 19 images]
[im 2/35  brain]
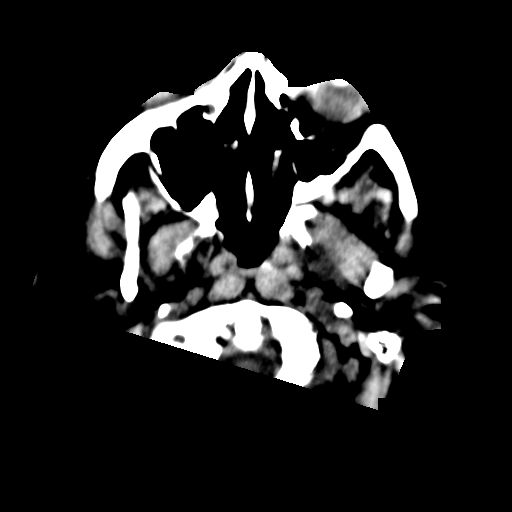
[im 2/35  bone]
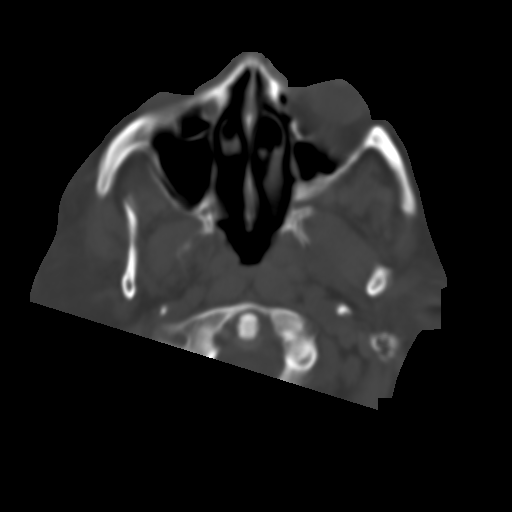
[im 4/35  brain]
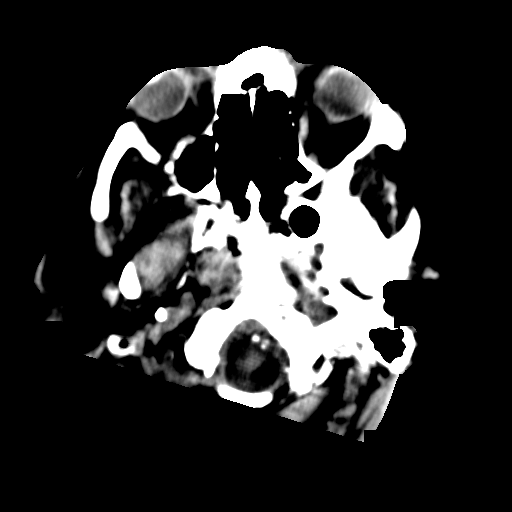
[im 6/35  brain]
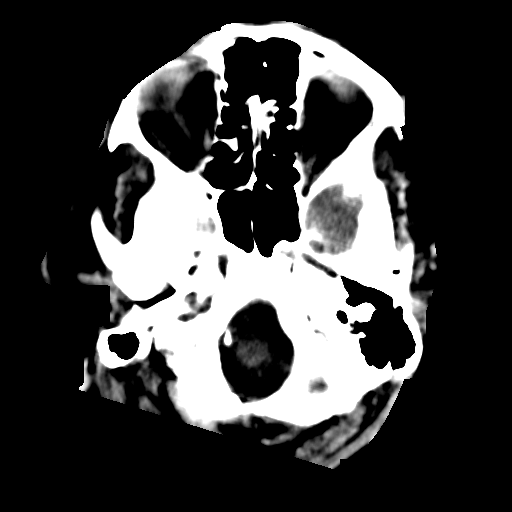
[im 9/35  brain]
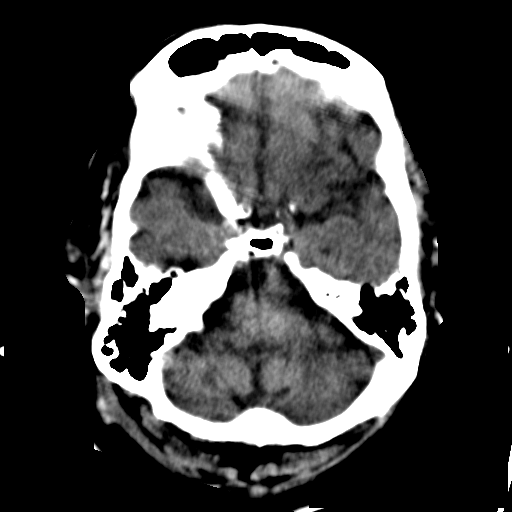
[im 11/35  brain]
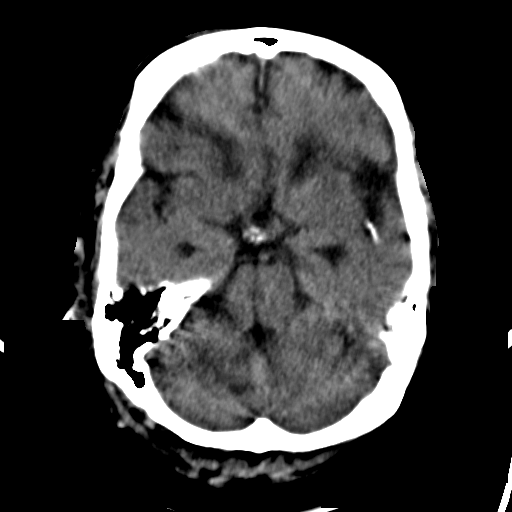
[im 11/35  bone]
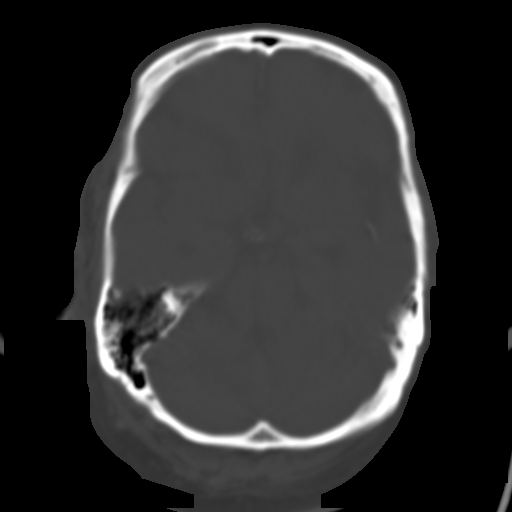
[im 13/35  brain]
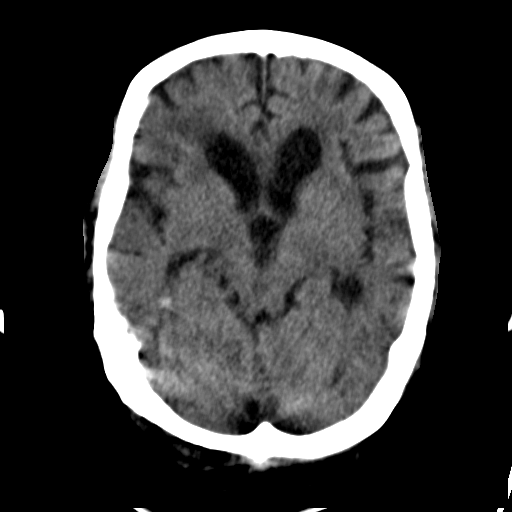
[im 16/35  brain]
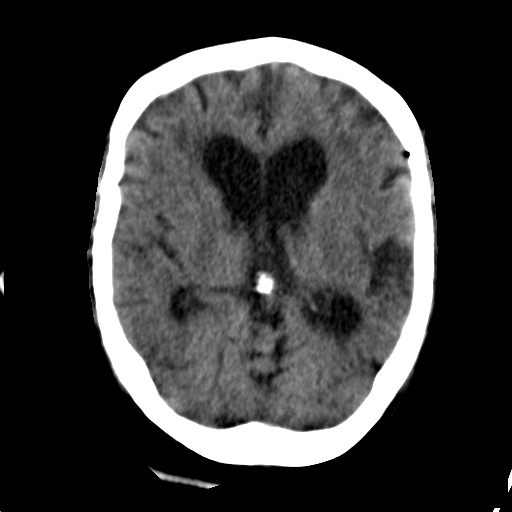
[im 18/35  brain]
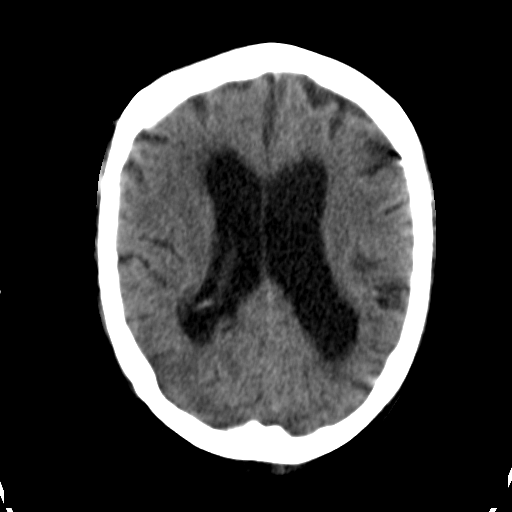
[im 19/35  brain]
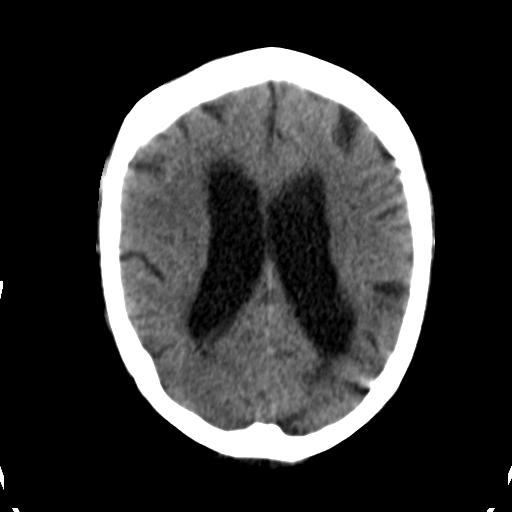
[im 19/35  bone]
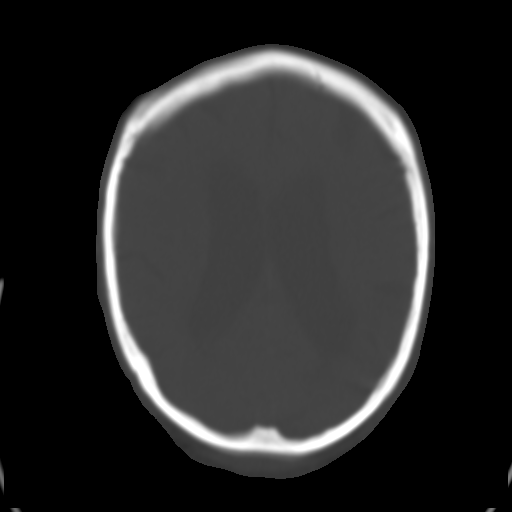
[im 22/35  brain]
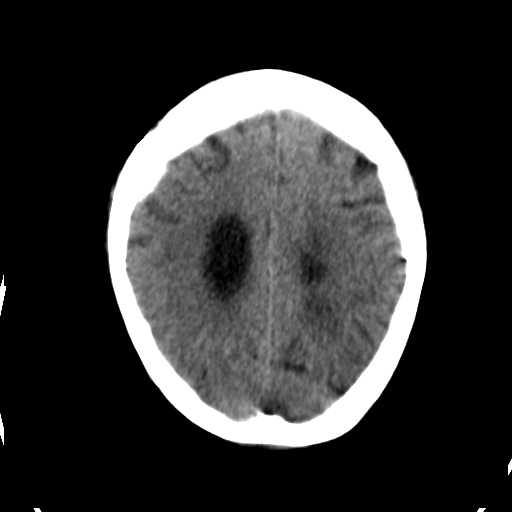
[im 24/35  brain]
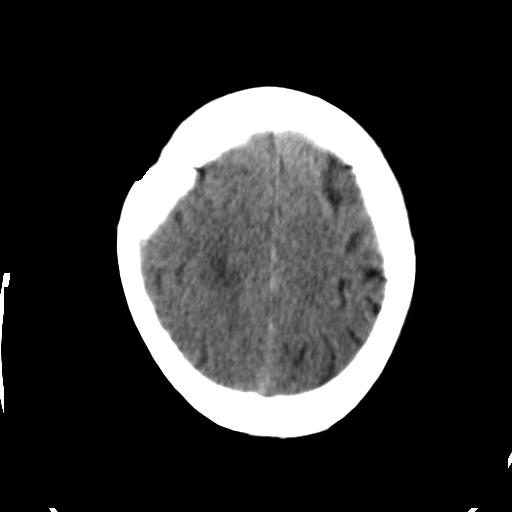
[im 26/35  brain]
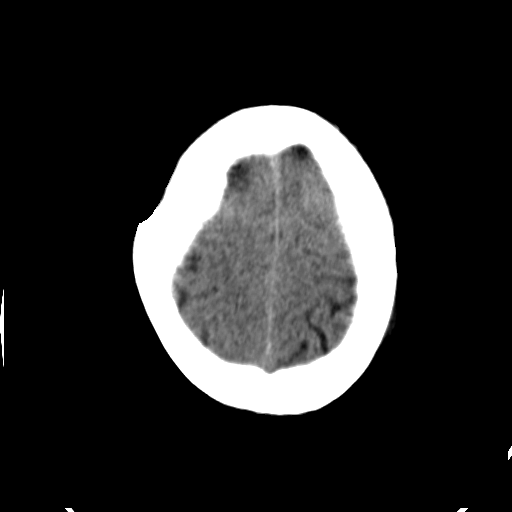
[im 29/35  brain]
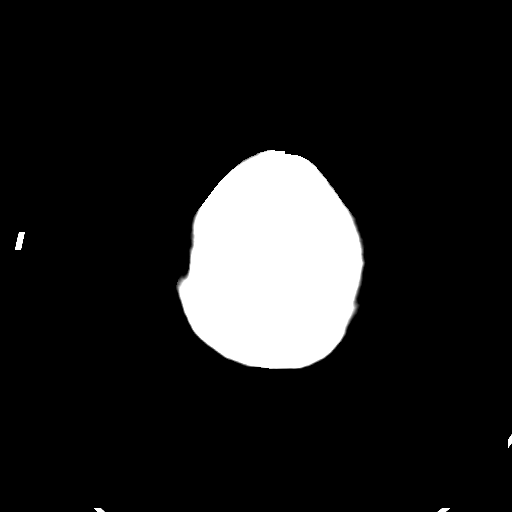
[im 29/35  bone]
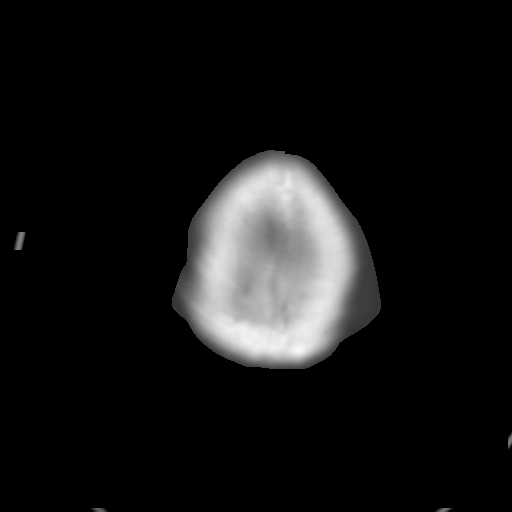
[im 31/35  brain]
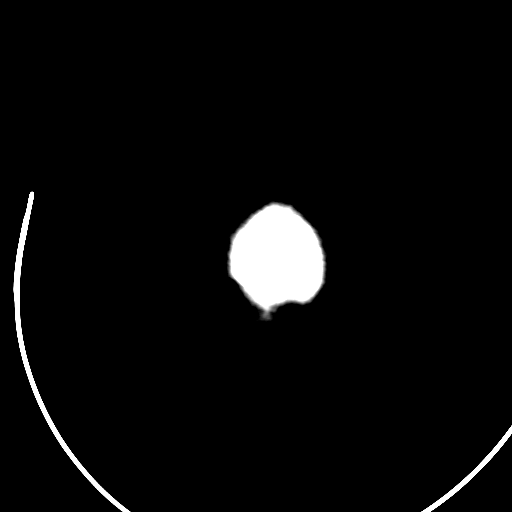
[im 33/35  brain]
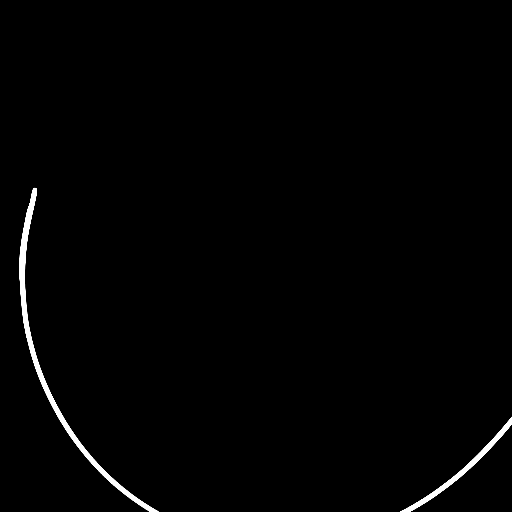

[15 of 30 positions shown; findings below may reference images not displayed]

FINDINGS: Examination is degraded due to patient motion due to altered mental
status.

Similar findings of advanced atrophy with sulcal prominence and
centralized volume loss new with commensurate ex vacuo dilatation of
the ventricular system, grossly unchanged. Scattered periventricular
hypodensities compatible microvascular ischemic disease.

There are unchanged vascular calcifications within the left MCA
(representative images 12 and 16, series 4) with associated
geographic encephalomalacia involving the left parietal temporal
lobe (representative images 17 through 21, series 4).

Given extensive background parenchymal abnormalities, there is no CT
evidence of superimposed acute large territory infarct. No
intraparenchymal or extra-axial mass or hemorrhage. Unchanged size
and configuration of the ventricles and basilar cisterns. No midline
shift.

Limited visualization the paranasal sinuses is normal. No discrete
air-fluid levels. Regional soft tissues appear normal. No displaced
calvarial fracture.
IMPRESSION: 1. No acute intracranial process.
2. Similar findings of advanced atrophy and microvascular ischemic
disease.
3. Remote left parietal-temporal lobe infarct with calcified left
MCA.

## 2017-04-08 IMAGING — CR DG CHEST 1V PORT
1 series · 1 of 1 positions shown · non-contrast
Comparison: 09/28/2014

CLINICAL DATA: Generalize weakness today. Shortness of breath and
fever. Dialysis patient.

EXAM:
PORTABLE CHEST 1 VIEW

[ap]
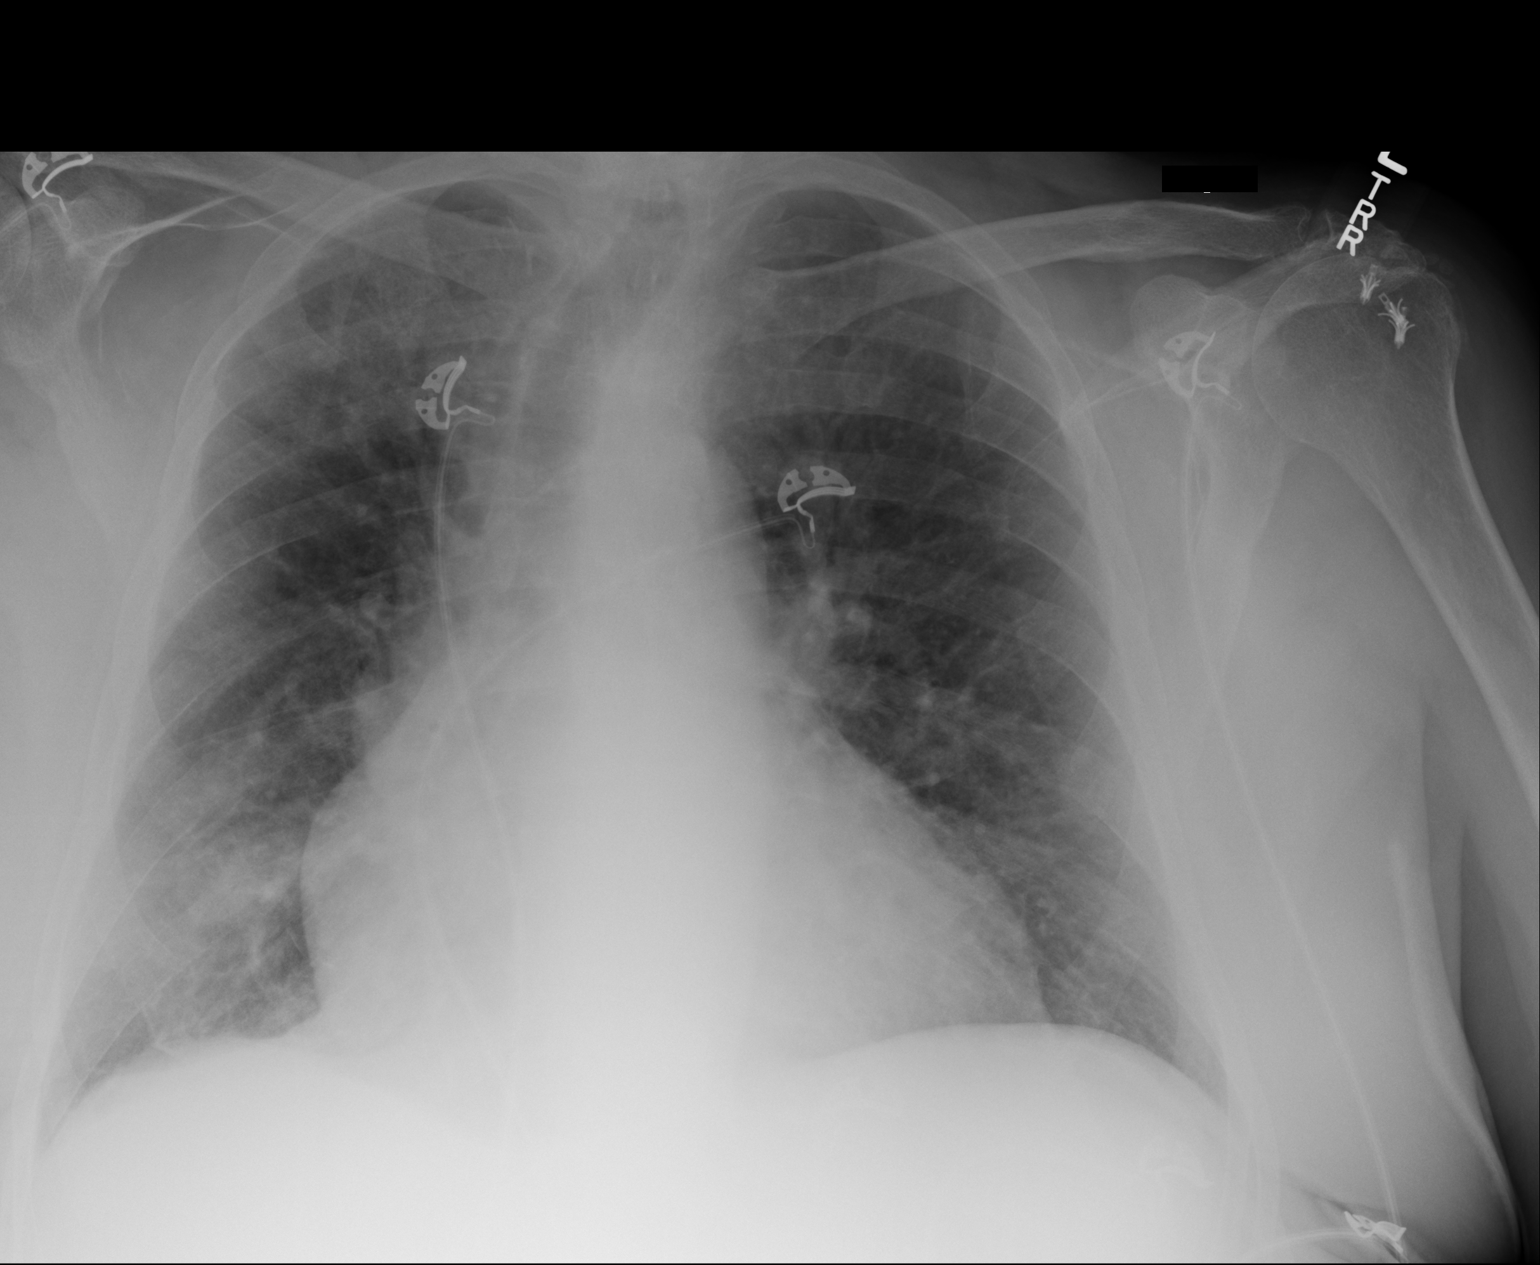

[1 of 1 positions shown; findings below may reference images not displayed]

FINDINGS: Cardiac enlargement with mild vascular congestion. No edema or
consolidation in the lungs. No blunting of costophrenic angles. No
pneumothorax. Mediastinal contours appear intact. Thoracic scoliosis
convex towards the right. Postoperative changes in the cervical
spine and left shoulder.
IMPRESSION: Cardiac enlargement with mild pulmonary vascular congestion. No
edema or consolidation.
